# Patient Record
Sex: Male | Born: 1960 | Race: Black or African American | Hispanic: No | Smoking: Never smoker
Health system: Southern US, Community
[De-identification: ages and names within clinical notes are randomized; demographics above are authoritative.]

## PROBLEM LIST (undated history)

## (undated) DIAGNOSIS — R0600 Dyspnea, unspecified: Secondary | ICD-10-CM

## (undated) DIAGNOSIS — E119 Type 2 diabetes mellitus without complications: Secondary | ICD-10-CM

## (undated) DIAGNOSIS — E039 Hypothyroidism, unspecified: Secondary | ICD-10-CM

## (undated) DIAGNOSIS — I480 Paroxysmal atrial fibrillation: Secondary | ICD-10-CM

## (undated) DIAGNOSIS — I350 Nonrheumatic aortic (valve) stenosis: Secondary | ICD-10-CM

## (undated) DIAGNOSIS — I10 Essential (primary) hypertension: Secondary | ICD-10-CM

## (undated) DIAGNOSIS — R011 Cardiac murmur, unspecified: Secondary | ICD-10-CM

## (undated) DIAGNOSIS — I4892 Unspecified atrial flutter: Secondary | ICD-10-CM

## (undated) HISTORY — PX: CHOLECYSTECTOMY: SHX55

---

## 1999-08-06 ENCOUNTER — Ambulatory Visit (HOSPITAL_COMMUNITY): Admission: RE | Admit: 1999-08-06 | Discharge: 1999-08-06 | Payer: Self-pay

## 2001-03-05 ENCOUNTER — Ambulatory Visit (HOSPITAL_COMMUNITY): Admission: RE | Admit: 2001-03-05 | Discharge: 2001-03-05 | Payer: Self-pay | Admitting: Internal Medicine

## 2001-03-05 ENCOUNTER — Encounter: Payer: Self-pay | Admitting: Internal Medicine

## 2002-02-16 ENCOUNTER — Emergency Department (HOSPITAL_COMMUNITY): Admission: EM | Admit: 2002-02-16 | Discharge: 2002-02-16 | Payer: Self-pay | Admitting: Emergency Medicine

## 2003-04-09 ENCOUNTER — Ambulatory Visit (HOSPITAL_COMMUNITY): Admission: RE | Admit: 2003-04-09 | Discharge: 2003-04-09 | Payer: Self-pay | Admitting: Internal Medicine

## 2003-04-09 ENCOUNTER — Encounter: Payer: Self-pay | Admitting: Internal Medicine

## 2003-04-14 ENCOUNTER — Ambulatory Visit (HOSPITAL_COMMUNITY): Admission: RE | Admit: 2003-04-14 | Discharge: 2003-04-14 | Payer: Self-pay | Admitting: Internal Medicine

## 2003-04-14 ENCOUNTER — Encounter: Payer: Self-pay | Admitting: Internal Medicine

## 2004-02-11 ENCOUNTER — Emergency Department (HOSPITAL_COMMUNITY): Admission: EM | Admit: 2004-02-11 | Discharge: 2004-02-12 | Payer: Self-pay | Admitting: Emergency Medicine

## 2004-10-22 ENCOUNTER — Ambulatory Visit (HOSPITAL_COMMUNITY): Admission: RE | Admit: 2004-10-22 | Discharge: 2004-10-22 | Payer: Self-pay | Admitting: Internal Medicine

## 2006-01-30 ENCOUNTER — Emergency Department (HOSPITAL_COMMUNITY): Admission: EM | Admit: 2006-01-30 | Discharge: 2006-01-30 | Payer: Self-pay | Admitting: Emergency Medicine

## 2006-09-20 ENCOUNTER — Emergency Department (HOSPITAL_COMMUNITY): Admission: EM | Admit: 2006-09-20 | Discharge: 2006-09-20 | Payer: Self-pay | Admitting: Emergency Medicine

## 2006-09-25 ENCOUNTER — Ambulatory Visit: Payer: Self-pay | Admitting: Orthopedic Surgery

## 2006-11-06 ENCOUNTER — Ambulatory Visit: Payer: Self-pay | Admitting: Orthopedic Surgery

## 2006-11-07 ENCOUNTER — Encounter (HOSPITAL_COMMUNITY): Admission: RE | Admit: 2006-11-07 | Discharge: 2006-12-07 | Payer: Self-pay | Admitting: Orthopedic Surgery

## 2006-12-08 ENCOUNTER — Encounter (HOSPITAL_COMMUNITY): Admission: RE | Admit: 2006-12-08 | Discharge: 2007-01-07 | Payer: Self-pay | Admitting: Orthopedic Surgery

## 2007-01-09 ENCOUNTER — Encounter (HOSPITAL_COMMUNITY): Admission: RE | Admit: 2007-01-09 | Discharge: 2007-02-08 | Payer: Self-pay | Admitting: Orthopedic Surgery

## 2007-02-08 ENCOUNTER — Ambulatory Visit: Payer: Self-pay | Admitting: Orthopedic Surgery

## 2007-02-09 ENCOUNTER — Encounter (HOSPITAL_COMMUNITY): Admission: RE | Admit: 2007-02-09 | Discharge: 2007-03-11 | Payer: Self-pay | Admitting: Orthopedic Surgery

## 2007-02-20 ENCOUNTER — Emergency Department (HOSPITAL_COMMUNITY): Admission: EM | Admit: 2007-02-20 | Discharge: 2007-02-20 | Payer: Self-pay | Admitting: Emergency Medicine

## 2009-01-27 ENCOUNTER — Emergency Department (HOSPITAL_COMMUNITY): Admission: EM | Admit: 2009-01-27 | Discharge: 2009-01-27 | Payer: Self-pay | Admitting: Emergency Medicine

## 2010-12-14 LAB — URINE MICROSCOPIC-ADD ON

## 2010-12-14 LAB — URINALYSIS, ROUTINE W REFLEX MICROSCOPIC
Glucose, UA: NEGATIVE mg/dL
Specific Gravity, Urine: 1.015 (ref 1.005–1.030)
pH: 7 (ref 5.0–8.0)

## 2010-12-14 LAB — CBC
HCT: 39.9 % (ref 39.0–52.0)
MCHC: 35.7 g/dL (ref 30.0–36.0)
Platelets: 192 10*3/uL (ref 150–400)
WBC: 6.4 10*3/uL (ref 4.0–10.5)

## 2010-12-14 LAB — DIFFERENTIAL
Lymphocytes Relative: 19 % (ref 12–46)
Lymphs Abs: 1.2 10*3/uL (ref 0.7–4.0)
Neutro Abs: 4.8 10*3/uL (ref 1.7–7.7)
Neutrophils Relative %: 75 % (ref 43–77)

## 2010-12-14 LAB — BASIC METABOLIC PANEL
BUN: 8 mg/dL (ref 6–23)
CO2: 27 mEq/L (ref 19–32)
GFR calc non Af Amer: >60 mL/min (ref >60)
Potassium: 4.2 mEq/L (ref 3.5–5.1)

## 2011-01-21 NOTE — Procedures (Signed)
NAME:  Johnathan Richmond, Johnathan Richmond                        ACCOUNT NO.:  [id number]   MEDICAL RECORD NO.:  [id number]                   PATIENT TYPE:  OUT   LOCATION:  RAD                                  FACILITY:  [facility]   PHYSICIAN:  Nicki Guadalajara, M.D.                  DATE OF BIRTH:  26-Aug-1961   DATE OF PROCEDURE:  04/09/2003  DATE OF DISCHARGE:                                  ECHOCARDIOGRAM   PROCEDURE:  2-D echo Doppler study.   INDICATIONS:  This study is performed to evaluate cardiac murmur.   FINDINGS:  1. Technically this is an adequate M-mode 2-dimensional comprehensive     Doppler study.  2. There is evidence for mild concentric left ventricular hypertrophy with     septal wall thickness measuring 12 mm and posterior wall thickness     measuring 11 mm, respectively.  Left ventricular end-diastolic, and end-     systolic dimensions are normal at 47 and 32 mm, respectively.  3. Systolic function is normal with an estimated ejection fraction of at     least 55 %. There is a suggestion of mild delay in diastolic relaxation     by the mitral valve inflow signal.  4. There is mild left atrial enlargement at 45 mm.  5. Right atrium is upper normal.  Right ventricle is normal.  6. Aortic root dimension is normal at 26 mm.  7. There is doming appearance to the aortic valve.  There is evidence for a     bicuspid valve with a questionable _______ raphe.  Transvalvular peak     instantaneous gradient is 21 mm.  Mean gradient is  13 mm.  Estimated aortic valve area is 1.6 sq cm.  1. Mitral valve leaflets were delicate. There was no MR.  2. Tricuspid valve was structurally normal.  3. Pulmonic valve was structurally normal.  4. There were no intramyocardial masses, thrombi, or fusion seen.   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  Peak gradient  is 21 mm and  mean aortic  gradient is 38 mmHg.  There is no aortic regurgitation.  There is evidence  for normal systolic left ventricular function, but evidence for mild  concentric left ventricular hypertrophy and mild delay in diastolic  relaxation.  There also was a mild left atrial enlargement.                                               Nicki Guadalajara, M.D.    TK/MEDQ  D:  04/09/2003  T:  04/09/2003  Job:  [id number]   cc:   Tesfaye D. Felecia Shelling, M.D.  [address]  [place]  [place] [address]  Fax: [phone]

## 2014-09-25 ENCOUNTER — Emergency Department (HOSPITAL_COMMUNITY): Payer: Self-pay

## 2014-09-25 ENCOUNTER — Emergency Department (HOSPITAL_COMMUNITY)
Admission: EM | Admit: 2014-09-25 | Discharge: 2014-09-25 | Disposition: A | Discharge status: Home / Self Care | Payer: Self-pay | Attending: Emergency Medicine | Admitting: Emergency Medicine

## 2014-09-25 ENCOUNTER — Encounter (HOSPITAL_COMMUNITY): Payer: Self-pay | Admitting: *Deleted

## 2014-09-25 DIAGNOSIS — R52 Pain, unspecified: Secondary | ICD-10-CM

## 2014-09-25 DIAGNOSIS — M7661 Achilles tendinitis, right leg: Secondary | ICD-10-CM | POA: Insufficient documentation

## 2014-09-25 DIAGNOSIS — M25571 Pain in right ankle and joints of right foot: Secondary | ICD-10-CM | POA: Insufficient documentation

## 2014-09-25 MED ORDER — HYDROCODONE-ACETAMINOPHEN 5-325 MG PO TABS: 1.0000 | ORAL_TABLET | ORAL | Status: DC | PRN

## 2014-09-25 MED ORDER — PREDNISONE 50 MG PO TABS
60.0000 mg | ORAL_TABLET | Freq: Once | ORAL | Status: AC
  Administered 2014-09-25: 60 mg via ORAL
  Filled 2014-09-25 (×2): qty 1

## 2014-09-25 MED ORDER — DICLOFENAC SODIUM 75 MG PO TBEC: 75.0000 mg | DELAYED_RELEASE_TABLET | Freq: Two times a day (BID) | ORAL | Status: DC

## 2014-09-25 MED ORDER — KETOROLAC TROMETHAMINE 10 MG PO TABS
10.0000 mg | ORAL_TABLET | Freq: Once | ORAL | Status: AC
  Administered 2014-09-25: 10 mg via ORAL
  Filled 2014-09-25: qty 1

## 2014-09-25 MED ORDER — PREDNISONE 10 MG PO TABS: ORAL_TABLET | ORAL | Status: DC

## 2014-09-25 NOTE — Care Management Note (Signed)
ED/CM noted patient did not have health insurance and/or PCP listed in the computer.  Patient was given the [place] resource handout with information on the clinics, food pantries, and the handout for new health insurance sign-up. Pt was also given a Rx discount card. Patient expressed appreciation for information received. 

## 2014-09-25 NOTE — ED Notes (Signed)
Pt with pain to posterior ankle of right foot for 4 weeks, states that he was playing basketball and another player had landed to back of ankle

## 2014-09-25 NOTE — ED Notes (Signed)
Paged Dr.Keeling at 18:44 for Johnathan Richmond

## 2014-09-25 NOTE — Discharge Instructions (Signed)
YOur MRI suggest severe achilles tendinitis. Please use the cam walker until instructed to stop by orthopedics. Use crutches until you can safely apply weight to the right ankle. Please use diclofenac and prednisone taper daily with food. Use norco every 4 hours for pain if needed. This medication may cause drowsiness, use with caution. Please call Dr Hilda LiasKeeling next week for appointment and follow up. Achilles Tendinitis Achilles tendinitis is inflammation of the tough, cord-like band that attaches the lower muscles of your leg to your heel (Achilles tendon). It is usually caused by overusing the tendon and joint involved.  CAUSES Achilles tendinitis can happen because of:  A sudden increase in exercise or activity (such as running).  Doing the same exercises or activities (such as jumping) over and over.  Not warming up calf muscles before exercising.  Exercising in shoes that are worn out or not made for exercise.  Having arthritis or a bone growth on the back of the heel bone. This can rub against the tendon and hurt the tendon. SIGNS AND SYMPTOMS The most common symptoms are:  Pain in the back of the leg, just above the heel. The pain usually gets worse with exercise and better with rest.  Stiffness or soreness in the back of the leg, especially in the morning.  Swelling of the skin over the Achilles tendon.  Trouble standing on tiptoe. Sometimes, an Achilles tendon tears (ruptures). Symptoms of an Achilles tendon rupture can include:  Sudden, severe pain in the back of the leg.  Trouble putting weight on the foot or walking normally. DIAGNOSIS Achilles tendinitis will be diagnosed based on symptoms and a physical examination. An X-ray may be done to check if another condition is causing your symptoms. An MRI may be ordered if your health care provider suspects you may have completely torn your tendon, which is called an Achilles tendon rupture.  TREATMENT  Achilles tendinitis  usually gets better over time. It can take weeks to months to heal completely. Treatment focuses on treating the symptoms and helping the injury heal. HOME CARE INSTRUCTIONS   Rest your Achilles tendon and avoid activities that cause pain.  Apply ice to the injured area:  Put ice in a plastic bag.  Place a towel between your skin and the bag.  Leave the ice on for 20 minutes, 2-3 times a day  Try to avoid using the tendon (other than gentle range of motion) while the tendon is painful. Do not resume use until instructed by your health care provider. Then begin use gradually. Do not increase use to the point of pain. If pain does develop, decrease use and continue the above measures. Gradually increase activities that do not cause discomfort until you achieve normal use.  Do exercises to make your calf muscles stronger and more flexible. Your health care provider or physical therapist can recommend exercises for you to do.  Wrap your ankle with an elastic bandage or other wrap. This can help keep your tendon from moving too much. Your health care provider will show you how to wrap your ankle correctly.  Only take over-the-counter or prescription medicines for pain, discomfort, or fever as directed by your health care provider. SEEK MEDICAL CARE IF:   Your pain and swelling increase or pain is uncontrolled with medicines.  You develop new, unexplained symptoms or your symptoms get worse.  You are unable to move your toes or foot.  You develop warmth and swelling in your foot.  You have  an unexplained temperature. MAKE SURE YOU:   Understand these instructions.  Will watch your condition.  Will get help right away if you are not doing well or get worse. Document Released: 06/01/2005 Document Revised: 06/12/2013 Document Reviewed: 04/03/2013 [facility] Patient Information 2015 [place], [place]. This information is not intended to replace advice given to you by your health care  provider. Make sure you discuss any questions you have with your health care provider.

## 2014-09-25 NOTE — ED Provider Notes (Signed)
CSN: [id number]     Arrival date & time 09/25/14  1430 History   First MD Initiated Contact with Patient 09/25/14 1505     Chief Complaint  Patient presents with  . Foot Pain     (Consider location/radiation/quality/duration/timing/severity/associated sxs/prior Treatment) Patient is a 54 y.o. male presenting with lower extremity pain. The history is provided by the patient.  Foot Pain This is a new problem. The current episode started more than 1 month ago. The problem occurs intermittently. The problem has been gradually worsening. Associated symptoms include arthralgias and joint swelling. Pertinent negatives include no abdominal pain, chest pain, coughing, neck pain or numbness. The symptoms are aggravated by standing and walking. He has tried nothing for the symptoms. The treatment provided no relief.    History reviewed. No pertinent past medical history. Past Surgical History  Procedure Laterality Date  . Cholecystectomy     History reviewed. No pertinent family history. History  Substance Use Topics  . Smoking status: Never Smoker   . Smokeless tobacco: Not on file  . Alcohol Use: No    Review of Systems  Constitutional: Negative for activity change.       All ROS Neg except as noted in HPI  HENT: Negative for nosebleeds.   Eyes: Negative for photophobia and discharge.  Respiratory: Negative for cough, shortness of breath and wheezing.   Cardiovascular: Negative for chest pain and palpitations.  Gastrointestinal: Negative for abdominal pain and blood in stool.  Genitourinary: Negative for dysuria, frequency and hematuria.  Musculoskeletal: Positive for joint swelling and arthralgias. Negative for back pain and neck pain.  Skin: Negative.   Neurological: Negative for dizziness, seizures, speech difficulty and numbness.  Psychiatric/Behavioral: Negative for hallucinations and confusion.      Allergies  Review of patient's allergies indicates no known  allergies.  Home Medications   Prior to Admission medications   Not on File   BP 136/93 mmHg  Pulse 86  Temp(Src) 98.5 F (36.9 C) (Oral)  Resp 20  Ht  (1.88 m)  Wt 289 lb (131.09 kg)  BMI 37.09 kg/m2  SpO2 95% Physical Exam  Musculoskeletal:       Right ankle: He exhibits decreased range of motion and swelling. He exhibits no ecchymosis, no deformity and normal pulse. Tenderness. Achilles tendon exhibits pain. Achilles tendon exhibits normal Thompson's test results.    ED Course  Procedures (including critical care time) Labs Review Labs Reviewed - No data to display  Imaging Review Dg Ankle Complete Right  09/25/2014   CLINICAL DATA:  Posterior RIGHT ankle pain and swelling for 4 weeks, another player stepped on his heel while playing basketball  EXAM: RIGHT ANKLE - COMPLETE 3+ VIEW  COMPARISON:  None  FINDINGS: Diffuse soft tissue swelling.  Osseous mineralization normal.  Ankle mortise intact.  Small plantar calcaneal spur.  No acute fracture or dislocation.  Distal Achilles tendon appears thickened with poor definition of the Achilles tendon more proximally.  IMPRESSION: No acute osseous abnormalities.  Small plantar calcaneal spur.  Thickening of the distal Achilles tendon with poor definition slightly more proximally, cannot exclude Achilles injury.  If there is clinically suspected injury to the Achilles tendon or the musculotendinous junction region, consider MR.   Electronically Signed   By: Ulyses Southward M.D.   On: 09/25/2014 15:23   Mr Ankle Right  Wo Contrast  09/25/2014   CLINICAL DATA:  Right ankle injury playing basketball 4 weeks ago. Continued pain and swelling.  EXAM:  MRI OF THE RIGHT ANKLE WITHOUT CONTRAST  TECHNIQUE: Multiplanar, multisequence MR imaging of the ankle was performed. No intravenous contrast was administered.  COMPARISON:  Plain films right ankle 08/19/2014 and earlier today.  FINDINGS: Subcutaneous edema seen about the ankle.  TENDONS  Peroneal:  There is longitudinal split tearing of the peroneus brevis as it passes on the calcaneus. No complete tear is identified.  Posteromedial: Intact.  Anterior:  Intact.  Achilles: The tendon is markedly thickened with intrasubstance increased T2 signal but no tear is identified.  Plantar Fascia: Intact.  LIGAMENTS  Lateral: Intact.  Medial: Intact.  CARTILAGE  Ankle Joint: Tiny osteochondral lesion is seen in the lateral talar dome posteriorly measuring 0.3 cm in diameter.  Subtalar Joints/Sinus Tarsi: Unremarkable.  Bones: No fracture, stress change or worrisome marrow lesion.  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  Longitudinal split tear of the peroneus brevis without surrounding edema is likely chronic.  Tiny osteochondral lesion lateral talar dome.   Electronically Signed   By: Drusilla Kannerhomas  Dalessio M.D.   On: 09/25/2014 18:37     EKG Interpretation None      MDM The MRI suggest a severe achilles tendinosis. Discussed case with Dr Hilda LiasKeeling. Pt placed in a cam walker and crutches. Rx for diclofenac, prednisone, and norco given to the patient. Pt to see Dr Hilda LiasKeeling next week..   Final diagnoses:  Pain  Ankle pain, right  Tendonitis, Achilles, right    *I have reviewed nursing notes, vital signs, and all appropriate lab and imaging results for this patient.[address]    Valera Vallas Garry HeaterM Delawrence Fridman, PA-C 09/25/14 1907  Raeford RazorStephen Kohut, MD 09/26/14 1556

## 2014-09-30 MED FILL — Hydrocodone-Acetaminophen Tab 5-325 MG: ORAL | Qty: 6 | Status: AC

## 2014-11-24 ENCOUNTER — Emergency Department (HOSPITAL_COMMUNITY)
Admission: EM | Admit: 2014-11-24 | Discharge: 2014-11-25 | Disposition: A | Discharge status: Home / Self Care | Payer: Self-pay | Attending: Emergency Medicine | Admitting: Emergency Medicine

## 2014-11-24 ENCOUNTER — Emergency Department (HOSPITAL_COMMUNITY): Payer: Self-pay

## 2014-11-24 ENCOUNTER — Encounter (HOSPITAL_COMMUNITY): Payer: Self-pay | Admitting: *Deleted

## 2014-11-24 DIAGNOSIS — Y9389 Activity, other specified: Secondary | ICD-10-CM | POA: Insufficient documentation

## 2014-11-24 DIAGNOSIS — Z791 Long term (current) use of non-steroidal anti-inflammatories (NSAID): Secondary | ICD-10-CM | POA: Insufficient documentation

## 2014-11-24 DIAGNOSIS — Y9289 Other specified places as the place of occurrence of the external cause: Secondary | ICD-10-CM | POA: Insufficient documentation

## 2014-11-24 DIAGNOSIS — R52 Pain, unspecified: Secondary | ICD-10-CM

## 2014-11-24 DIAGNOSIS — R109 Unspecified abdominal pain: Secondary | ICD-10-CM | POA: Insufficient documentation

## 2014-11-24 DIAGNOSIS — X58XXXA Exposure to other specified factors, initial encounter: Secondary | ICD-10-CM | POA: Insufficient documentation

## 2014-11-24 DIAGNOSIS — Y998 Other external cause status: Secondary | ICD-10-CM | POA: Insufficient documentation

## 2014-11-24 DIAGNOSIS — Z79899 Other long term (current) drug therapy: Secondary | ICD-10-CM | POA: Insufficient documentation

## 2014-11-24 DIAGNOSIS — S39012A Strain of muscle, fascia and tendon of lower back, initial encounter: Secondary | ICD-10-CM | POA: Insufficient documentation

## 2014-11-24 NOTE — ED Notes (Addendum)
Pt reporting pain in right flank.  Pt denies any pain with urination, denies history of kidney stones.  Denies nausea or vomiting. Reports that he was playing golf yesterday.

## 2014-11-25 ENCOUNTER — Emergency Department (HOSPITAL_COMMUNITY): Payer: Self-pay

## 2014-11-25 LAB — URINALYSIS, ROUTINE W REFLEX MICROSCOPIC
Bilirubin Urine: NEGATIVE
Glucose, UA: NEGATIVE mg/dL
Hgb urine dipstick: NEGATIVE
Ketones, ur: NEGATIVE mg/dL
LEUKOCYTES UA: NEGATIVE
Nitrite: NEGATIVE
PH: 5.5 (ref 5.0–8.0)
Protein, ur: NEGATIVE mg/dL
Urobilinogen, UA: 0.2 mg/dL (ref 0.0–1.0)

## 2014-11-25 MED ORDER — NAPROXEN 500 MG PO TABS: 500.0000 mg | ORAL_TABLET | Freq: Two times a day (BID) | ORAL | Status: DC

## 2014-11-25 MED ORDER — CYCLOBENZAPRINE HCL 5 MG PO TABS: 5.0000 mg | ORAL_TABLET | Freq: Three times a day (TID) | ORAL | Status: DC | PRN

## 2014-11-25 MED ORDER — FENTANYL CITRATE 0.05 MG/ML IJ SOLN
50.0000 ug | Freq: Once | INTRAMUSCULAR | Status: AC
  Administered 2014-11-25: 50 ug via INTRAVENOUS
  Filled 2014-11-25: qty 2

## 2014-11-25 MED ORDER — KETOROLAC TROMETHAMINE 30 MG/ML IJ SOLN
30.0000 mg | Freq: Once | INTRAMUSCULAR | Status: AC
  Administered 2014-11-25: 30 mg via INTRAVENOUS
  Filled 2014-11-25: qty 1

## 2014-11-25 NOTE — ED Provider Notes (Signed)
CSN: [id number]     Arrival date & time 11/24/14  2204 History   First MD Initiated Contact with Patient 11/25/14 0053     Chief Complaint  Patient presents with  . Flank Pain     (Consider location/radiation/quality/duration/timing/severity/associated sxs/prior Treatment) HPI  Patient states he started getting left-sided flank pain a few days ago. He states it comes and goes and last a few minutes. He states however today the pain has been constant. He states he feels like he is "bleeding inside" and states he feels a dripping in his left flank area inside. He states standing up makes the pain feel worse, sitting down makes it feel better. He denies nausea, vomiting, hematuria, or fever. He states he's never had this before. He does report he is a heavy caffeine drinker and drinks about 80 ounces of soda a day. He does report he played golf over the weekend but states he does that all the time. He denies any other known injury.  Family history is negative for renal stones.  Patient states he used to take thyroid pills when Dr. Katrinka Blazing was in town. Dr. Katrinka Blazing has been gone at least 10 years.  PCP None  History reviewed. No pertinent past medical history. Past Surgical History  Procedure Laterality Date  . Cholecystectomy     No family history on file. History  Substance Use Topics  . Smoking status: Never Smoker   . Smokeless tobacco: Not on file  . 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     Allergies  Review of patient's allergies indicates no known allergies.  Home Medications   Prior to Admission medications   Medication Sig Start Date End Date Taking? Authorizing Provider  cyclobenzaprine (FLEXERIL) 5 MG tablet Take 1 tablet (5 mg total) by mouth 3 (three) times daily as needed. 11/25/14   Devoria Albe, MD  diclofenac (VOLTAREN) 75 MG EC tablet Take 1 tablet (75 mg total) by mouth 2 (two) times  daily. 09/25/14   Ivery Quale, PA-C  diclofenac (VOLTAREN) 75 MG EC tablet Take 1 tablet (75 mg total) by mouth 2 (two) times daily. 09/25/14   Ivery Quale, PA-C  HYDROcodone-acetaminophen (NORCO/VICODIN) 5-325 MG per tablet Take 1-2 tablets by mouth every 4 (four) hours as needed for moderate pain or severe pain. 09/25/14   Ivery Quale, PA-C  HYDROcodone-acetaminophen (NORCO/VICODIN) 5-325 MG per tablet Take 1 tablet by mouth every 4 (four) hours as needed. 09/25/14   Ivery Quale, PA-C  naproxen (NAPROSYN) 500 MG tablet Take 1 tablet (500 mg total) by mouth 2 (two) times daily. 11/25/14   Devoria Albe, MD  predniSONE (DELTASONE) 10 MG tablet 5,4,3,2,1 - take with food 09/25/14   Ivery Quale, PA-C   BP 136/87 mmHg  Pulse 74  Temp(Src) 97.7 F (36.5 C) (Oral)  Resp 16  Ht  (1.88 m)  Wt 290 lb (131.543 kg)  BMI 37.22 kg/m2  SpO2 99%  Vital signs normal   Physical Exam  Constitutional: He is oriented to person, place, and time. He appears well-developed and well-nourished.  Non-toxic appearance. He does not appear ill. No distress.  Patient is sitting in a wheelchair at the bedside. He states it is too uncomfortable to lay down on the stretcher.  HENT:  Head: Normocephalic and atraumatic.  Right Ear: External ear normal.  Left Ear: External ear normal.  Nose: Nose normal. No mucosal edema  or rhinorrhea.  Mouth/Throat: Oropharynx is clear and moist and mucous membranes are normal. No dental abscesses or uvula swelling.  Eyes: Conjunctivae and EOM are normal. Pupils are equal, round, and reactive to light.  Neck: Normal range of motion and full passive range of motion without pain. Neck supple.  Cardiovascular: Normal rate, regular rhythm and normal heart sounds.  Exam reveals no gallop and no friction rub.   No murmur heard. Pulmonary/Chest: Effort normal and breath sounds normal. No respiratory distress. He has no wheezes. He has no rhonchi. He has no rales. He exhibits no  tenderness and no crepitus.  Abdominal: Soft. Normal appearance and bowel sounds are normal. He exhibits no distension. There is no tenderness. There is no rebound and no guarding.  Musculoskeletal: Normal range of motion. He exhibits no edema or tenderness.  Moves all extremities well.  Nontender thoracic and lumbar spine. He has some tenderness over the left paraspinous/flank area. He appears painful when he changes positions.  Neurological: He is alert and oriented to person, place, and time. He has normal strength. No cranial nerve deficit.  Skin: Skin is warm, dry and intact. No rash noted. No erythema. No pallor.  Psychiatric: He has a normal mood and affect. His speech is normal and behavior is normal. His mood appears not anxious.  Nursing note and vitals reviewed.   ED Course  Procedures (including critical care time)  Medications  fentaNYL (SUBLIMAZE) injection 50 mcg (50 mcg Intravenous Given 11/25/14 0126)  ketorolac (TORADOL) 30 MG/ML injection 30 mg (30 mg Intravenous Given 11/25/14 0324)    Patient was given thinned L for his pain while waiting for his CT results. He was given his CT results and had Toradol added to his pain medication.   Labs Review Results for orders placed or performed during the hospital encounter of 11/24/14  Urinalysis, Routine w reflex microscopic  Result Value Ref Range   Color, Urine YELLOW YELLOW   APPearance CLEAR CLEAR   Specific Gravity, Urine >1.030 (H) 1.005 - 1.030   pH 5.5 5.0 - 8.0   Glucose, UA NEGATIVE NEGATIVE mg/dL   Hgb urine dipstick NEGATIVE NEGATIVE   Bilirubin Urine NEGATIVE NEGATIVE   Ketones, ur NEGATIVE NEGATIVE mg/dL   Protein, ur NEGATIVE NEGATIVE mg/dL   Urobilinogen, UA 0.2 0.0 - 1.0 mg/dL   Nitrite NEGATIVE NEGATIVE   Leukocytes, UA NEGATIVE NEGATIVE   Laboratory interpretation all normal except concentrated urine     Imaging Review Dg Lumbar Spine Complete  11/25/2014   CLINICAL DATA:  Acute onset of right  lower back pain after playing golf. Initial encounter.  EXAM: LUMBAR SPINE - COMPLETE 4+ VIEW  COMPARISON:  CT of the abdomen and pelvis from 01/27/2009  FINDINGS: There is no evidence of fracture or subluxation. Vertebral bodies demonstrate normal height and alignment. Intervertebral disc spaces are preserved. The visualized neural foramina are grossly unremarkable in appearance.  The visualized bowel gas pattern is unremarkable in appearance; air and stool are noted within the colon. The sacroiliac joints are within normal limits. Clips are noted within the right upper quadrant, reflecting prior cholecystectomy.  IMPRESSION: No evidence of fracture or subluxation along the lumbar spine.   Electronically Signed   By: Roanna Raider M.D.   On: 11/25/2014 00:25   Ct Renal Stone Study  11/25/2014   CLINICAL DATA:  Left-sided flank pain after golf yesterday  EXAM: CT ABDOMEN AND PELVIS WITHOUT CONTRAST  TECHNIQUE: Multidetector CT imaging of the abdomen and pelvis  was performed following the standard protocol without IV contrast.  COMPARISON:  Radiographs 11/24/2014  FINDINGS: There are unremarkable unenhanced appearances of the liver, spleen, pancreas, adrenals and kidneys. There is cholecystectomy. Mesentery and bowel appear unremarkable. The abdominal aorta is normal in caliber with no atherosclerotic calcifications.  There is no acute inflammatory change in the abdomen or pelvis. There is no ascites.  No acute musculoskeletal abnormalities are evident.  There is a ventral hernia at the umbilicus containing unobstructed small bowel.  No significant abnormality is evident in the lower chest.  IMPRESSION: 1. No acute findings in the abdomen or pelvis 2. Umbilical hernia containing unobstructed small bowel   Electronically Signed   By: Ellery Plunkaniel R Mitchell M.D.   On: 11/25/2014 02:13     EKG Interpretation None      MDM   patient presents with left flank pain that appears to be musculoskeletal in origin. I did  not start him on his thyroid medication. He has been off of it many years. He needs to get a primary care doctor to start him on his medication again.    Final diagnoses:  Acute left flank pain  Strain of lumbar paraspinal muscle, initial encounter   Discharge Medication List as of 11/25/2014  3:20 AM    START taking these medications   Details  cyclobenzaprine (FLEXERIL) 5 MG tablet Take 1 tablet (5 mg total) by mouth 3 (three) times daily as needed., Starting 11/25/2014, Until Discontinued, Print    naproxen (NAPROSYN) 500 MG tablet Take 1 tablet (500 mg total) by mouth 2 (two) times daily., Starting 11/25/2014, Until Discontinued, Print        Plan discharge  Devoria AlbeIva Melessia Kaus, MD, Concha PyoFACEP    Reannon Candella, MD 11/25/14 [phone]

## 2014-11-25 NOTE — Discharge Instructions (Signed)
Use ice and heat over the sore muscle. Avoid heavy lifting, straining or activity that makes the pain worse. Take the medications as prescribed. You need to get a primary care doctor to evaluate your thyroid problem or you could see the endocrinologist, Dr Fransico HimNida.   Recheck if you get worse such as fever, vomiting, worsening pain.

## 2014-11-25 NOTE — ED Notes (Signed)
Pt alert & oriented x4, stable gait. Patient given discharge instructions, paperwork & prescription(s). Patient stopped at the registration desk to finish any additional paperwork. Patient  verbalized understanding. Pt left department in wheelchair w/ no further questions. 

## 2014-11-25 NOTE — ED Notes (Signed)
Patient states that it hurts when he is lying down. Sitting in wheelchair at this time.

## 2017-05-23 ENCOUNTER — Encounter (HOSPITAL_COMMUNITY): Payer: Self-pay | Admitting: *Deleted

## 2017-05-23 ENCOUNTER — Emergency Department (HOSPITAL_COMMUNITY)
Admission: EM | Admit: 2017-05-23 | Discharge: 2017-05-24 | Disposition: A | Discharge status: Home / Self Care | Payer: Self-pay | Attending: Emergency Medicine | Admitting: Emergency Medicine

## 2017-05-23 DIAGNOSIS — I480 Paroxysmal atrial fibrillation: Secondary | ICD-10-CM | POA: Insufficient documentation

## 2017-05-23 DIAGNOSIS — R42 Dizziness and giddiness: Secondary | ICD-10-CM | POA: Insufficient documentation

## 2017-05-23 NOTE — ED Triage Notes (Signed)
Pt states he feels like his hear is palpating. Started while watching the game.

## 2017-05-24 ENCOUNTER — Other Ambulatory Visit (HOSPITAL_COMMUNITY): Payer: Self-pay | Admitting: *Deleted

## 2017-05-24 ENCOUNTER — Telehealth (HOSPITAL_COMMUNITY): Payer: Self-pay | Admitting: *Deleted

## 2017-05-24 LAB — I-STAT CHEM 8, ED
BUN: 14 mg/dL (ref 6–20)
CHLORIDE: 100 mmol/L — AB (ref 101–111)
Calcium, Ion: 1.09 mmol/L — ABNORMAL LOW (ref 1.15–1.40)
Creatinine, Ser: 1 mg/dL (ref 0.61–1.24)
GLUCOSE: 134 mg/dL — AB (ref 65–99)
HCT: 44 % (ref 39.0–52.0)
Hemoglobin: 15 g/dL (ref 13.0–17.0)
Potassium: 3.8 mmol/L (ref 3.5–5.1)
Sodium: 140 mmol/L (ref 135–145)
TCO2: 28 mmol/L (ref 22–32)

## 2017-05-24 LAB — I-STAT TROPONIN, ED: Troponin i, poc: 0 ng/mL (ref 0.00–0.08)

## 2017-05-24 LAB — PROTIME-INR
INR: 0.95
Prothrombin Time: 12.6 seconds (ref 11.4–15.2)

## 2017-05-24 MED ORDER — RIVAROXABAN 10 MG PO TABS
ORAL_TABLET | ORAL | Status: AC
  Filled 2017-05-24: qty 2

## 2017-05-24 MED ORDER — RIVAROXABAN 20 MG PO TABS: 20.0000 mg | ORAL_TABLET | Freq: Every day | ORAL | Status: DC

## 2017-05-24 MED ORDER — PROPOFOL 10 MG/ML IV BOLUS
80.0000 mg | Freq: Once | INTRAVENOUS | Status: DC
  Filled 2017-05-24: qty 20

## 2017-05-24 MED ORDER — RIVAROXABAN 20 MG PO TABS
20.0000 mg | ORAL_TABLET | Freq: Once | ORAL | Status: AC
  Administered 2017-05-24: 20 mg via ORAL
  Filled 2017-05-24: qty 1

## 2017-05-24 MED ORDER — SODIUM CHLORIDE 0.9 % IV BOLUS (SEPSIS)
1000.0000 mL | Freq: Once | INTRAVENOUS | Status: AC
  Administered 2017-05-24: 1000 mL via INTRAVENOUS

## 2017-05-24 MED ORDER — RIVAROXABAN 20 MG PO TABS: 20.0000 mg | ORAL_TABLET | Freq: Every day | ORAL | 0 refills | Status: DC

## 2017-05-24 MED ORDER — RIVAROXABAN (XARELTO) EDUCATION KIT FOR AFIB PATIENTS
PACK | Freq: Once | Status: AC
  Administered 2017-05-24: 04:00:00
  Filled 2017-05-24: qty 1

## 2017-05-24 NOTE — ED Notes (Signed)
Pt ambulatory to waiting room. Pt verbalized understanding of discharge instructions.   

## 2017-05-24 NOTE — Progress Notes (Signed)
ANTICOAGULATION CONSULT NOTE - Preliminary  Pharmacy Consult for Rivaroxaban Indication:Atrial fibbrillation  No Known Allergies  Patient Measurements: Height:  (188 cm) Weight: 300 lb (136.1 kg) IBW/kg (Calculated) : 82.2 HEPARIN DW (KG): 112.7   Vital Signs: Temp: 97.9 F (36.6 C) (09/18 2317) Temp Source: Oral (09/18 2317) BP: 129/87 (09/19 0200) Pulse Rate: 84 (09/19 0200)  Labs:  Recent Labs  05/24/17 0034  HGB 15.0  HCT 44.0  CREATININE 1.00   Estimated Creatinine Clearance: 119.7 mL/min (by C-G formula based on SCr of 1 mg/dL).  Medical History: History reviewed. No pertinent past medical history.  Medications:   Assessment: 56 yo obese male presented to the ED with 12-24 hr hx of palpitations and dizziness. Pharmacy has been asked to provide rivaroxaban dosing for dx of new onset Afib.  Goal of Therapy:  VTE prophylaxis   Plan:  Rivaroxaban 20 mg x one dose now.   Preliminary review of pertinent patient information completed.  Jeani Hawking clinical pharmacist will complete review during morning rounds to assess the patient and finalize treatment regimen.  Arelia Sneddon, [facility] 05/24/2017,2:23 AM

## 2017-05-24 NOTE — ED Provider Notes (Signed)
[facility] DEPT Provider Note   CSN: [id number] Arrival date & time: 05/23/17  2308     History   Chief Complaint Chief Complaint  Patient presents with  . Palpitations    HPI Johnathan Richmond is a 56 y.o. male.  The history is provided by the patient.  Palpitations   This is a new problem. The current episode started 12 to 24 hours ago. The problem occurs constantly. The problem has been gradually worsening. Associated symptoms include dizziness. Pertinent negatives include no fever, no chest pressure, no syncope, no abdominal pain, no vomiting and no shortness of breath. He has tried nothing for the symptoms. Risk factors include obesity.   Pt without any medical conditions presents with palpitations, over the past 12 hrs No syncope No new meds or OTC meds/vitamins He feels lightheaded No CP He has never had this before   PMH - none Soc hx - denies drug use  Past Surgical History:  Procedure Laterality Date  . CHOLECYSTECTOMY         Home Medications    Prior to Admission medications   Not on File    Family History No family history on file.  Social History Social History  Substance Use Topics  . Smoking status: Never Smoker  . Smokeless tobacco: Never Used  . Alcohol use No     Allergies   Patient has no known allergies.   Review of Systems Review of Systems  Constitutional: Negative for fever.  Respiratory: Negative for shortness of breath.   Cardiovascular: Positive for palpitations. Negative for syncope.  Gastrointestinal: Negative for abdominal pain and vomiting.  Neurological: Positive for dizziness. Negative for syncope.  All other systems reviewed and are negative.    Physical Exam Updated Vital Signs BP (!) 137/93 (BP Location: Left Arm)   Pulse (!) 51   Temp 97.9 F (36.6 C) (Oral)   Resp 18   Ht 1.88 m ('6\' 2"' )   Wt 136.1 kg (300 lb)   SpO2 95%   BMI 38.52 kg/m   Physical Exam CONSTITUTIONAL: Well developed/well  nourished HEAD: Normocephalic/atraumatic EYES: EOMI/PERRL ENMT: Mucous membranes moist NECK: supple no meningeal signs SPINE/BACK:entire spine nontender CV: tachycardic and irregular LUNGS: Lungs are clear to auscultation bilaterally, no apparent distress ABDOMEN: soft, nontender, no rebound or guarding, bowel sounds noted throughout abdomen GU:no cva tenderness NEURO: Pt is awake/alert/appropriate, moves all extremitiesx4.  No facial droop.   EXTREMITIES: pulses normal/equal, full ROM SKIN: warm, color normal PSYCH: no abnormalities of mood noted, alert and oriented to situation   ED Treatments / Results  Labs (all labs ordered are listed, but only abnormal results are displayed) Labs Reviewed  I-STAT CHEM 8, ED - Abnormal; Notable for the following:       Result Value   Chloride 100 (*)    Glucose, Bld 134 (*)    Calcium, Ion 1.09 (*)    All other components within normal limits  PROTIME-INR  I-STAT TROPONIN, ED    EKG  EKG Interpretation  Date/Time:  Tuesday May 23 2017 23:27:32 EDT Ventricular Rate:  92 PR Interval:    QRS Duration: 100 QT Interval:  362 QTC Calculation: 448 R Axis:   14 Text Interpretation:  Sinus rhythm Atrial premature complex RSR' in V1 or V2, right VCD or RVH No previous ECGs available Confirmed by Ripley Fraise ([id number]) on 05/23/2017 11:30:04 PM       Repeat EKG:   EKG Interpretation  Date/Time:  Wednesday May 24 2017  00:10:40 EDT Ventricular Rate:  163 PR Interval:    QRS Duration: 95 QT Interval:  316 QTC Calculation: 521 R Axis:   25 Text Interpretation:  Atrial flutter RSR' in V1 or V2, right VCD or RVH Prolonged QT interval changed from prior Confirmed by Ripley Fraise [id number]) on 05/24/2017 12:29:55 AM       Radiology No results found.  Procedures Procedures   Medications Ordered in ED Medications  sodium chloride 0.9 % bolus 1,000 mL (0 mLs Intravenous Stopped 05/24/17 0335)  rivaroxaban (XARELTO)  Education Kit for Afib patients ( Does not apply Given 05/24/17 0335)  rivaroxaban (XARELTO) tablet 20 mg (20 mg Oral Given 05/24/17 0233)     Initial Impression / Assessment and Plan / ED Course  I have reviewed the triage vital signs and the nursing notes.  Pertinent labs & imaging results that were available during my care of the patient were reviewed by me and considered in my medical decision making (see chart for details).    This patients CHA2DS2-VASc Score and unadjusted Ischemic Stroke Rate (% per year) is equal to 0.2 % stroke rate/year from a score of 0  Above score calculated as 1 point each if present [CHF, HTN, DM, Vascular=MI/PAD/Aortic Plaque, Age if 65-74, or Male] Above score calculated as 2 points each if present [Age > 75, or Stroke/TIA/TE]    Pt stable Found to have new onset afib He agrees to proceed with cardioversion No contraindications noted, symptoms less than 48 hrs  3:52 AM Pt spontaneously converted to sinus rhythm  EKG Interpretation  Date/Time:  Wednesday May 24 2017 01:54:59 EDT Ventricular Rate:  88 PR Interval:    QRS Duration: 91 QT Interval:  371 QTC Calculation: 449 R Axis:   -2 Text Interpretation:  Sinus rhythm RSR' in V1 or V2, right VCD or RVH changed from prior Confirmed by Ripley Fraise [phone]) on 05/24/2017 1:57:53 AM      Stable in ED He feels improved Will d/c home Will start xarelto until seen by cardiology Starter pack given Discussed side effects/risk of xarelto use including GI bleed Also discussed signs of repeat of afib and when to come back to the ER  BP 127/81   Pulse 71   Temp 97.9 F (36.6 C) (Oral)   Resp 13   Ht 1.88 m ('6\' 2"' )   Wt 136.1 kg (300 lb)   SpO2 95%   BMI 38.52 kg/m   Final Clinical Impressions(s) / ED Diagnoses   Final diagnoses:  Paroxysmal atrial fibrillation ([place])    New Prescriptions Discharge Medication List as of 05/24/2017  3:26 AM    START taking these medications    Details  rivaroxaban (XARELTO) 20 MG TABS tablet Take 1 tablet (20 mg total) by mouth daily with supper., Starting Wed 05/24/2017, Print         Ripley Fraise, MD 05/24/17 [phone]

## 2017-05-24 NOTE — Telephone Encounter (Signed)
LMOM.  Pt referred from [facility] ED for afib

## 2017-05-26 ENCOUNTER — Ambulatory Visit (HOSPITAL_COMMUNITY)
Admission: RE | Admit: 2017-05-26 | Discharge: 2017-05-26 | Disposition: A | Discharge status: Home / Self Care | Payer: Self-pay | Source: Ambulatory Visit | Attending: Nurse Practitioner | Admitting: Nurse Practitioner

## 2017-05-26 ENCOUNTER — Encounter (HOSPITAL_COMMUNITY): Payer: Self-pay | Admitting: Nurse Practitioner

## 2017-05-26 VITALS — BP 152/92 | HR 79 | Ht 74.0 in | Wt 305.8 lb

## 2017-05-26 DIAGNOSIS — Z7901 Long term (current) use of anticoagulants: Secondary | ICD-10-CM | POA: Insufficient documentation

## 2017-05-26 DIAGNOSIS — I10 Essential (primary) hypertension: Secondary | ICD-10-CM | POA: Insufficient documentation

## 2017-05-26 DIAGNOSIS — I480 Paroxysmal atrial fibrillation: Secondary | ICD-10-CM

## 2017-05-26 DIAGNOSIS — I4892 Unspecified atrial flutter: Secondary | ICD-10-CM | POA: Insufficient documentation

## 2017-05-26 DIAGNOSIS — I4510 Unspecified right bundle-branch block: Secondary | ICD-10-CM | POA: Insufficient documentation

## 2017-05-26 DIAGNOSIS — E669 Obesity, unspecified: Secondary | ICD-10-CM | POA: Insufficient documentation

## 2017-05-26 LAB — T4, FREE: Free T4: 0.82 ng/dL (ref 0.61–1.12)

## 2017-05-26 LAB — TSH: TSH: 8.555 u[IU]/mL — ABNORMAL HIGH (ref 0.350–4.500)

## 2017-05-26 MED ORDER — DILTIAZEM HCL 30 MG PO TABS: ORAL_TABLET | ORAL | 1 refills | Status: DC

## 2017-05-26 MED ORDER — DILTIAZEM HCL ER COATED BEADS 120 MG PO CP24: 120.0000 mg | ORAL_CAPSULE | Freq: Every day | ORAL | 3 refills | Status: DC

## 2017-05-26 NOTE — Patient Instructions (Addendum)
Your physician has recommended you make the following change in your medication:  1) Cardizem  once a day  2) Cardizem  -- take 1 tablet every 4 hours AS NEEDED for heart rate >100 as long as blood pressure >100.   Avoid ibuprofen, advil, motrin, aspirin while on xarelto. May take tylenol for pain.

## 2017-05-27 LAB — T3, FREE: T3, Free: 2.7 pg/mL (ref 2.0–4.4)

## 2017-05-29 NOTE — Progress Notes (Signed)
Primary Care Physician: Patient, No Pcp Per Referring Physician:[facility] ER f/u   Johnathan Richmond is a 56 y.o. male with a h/o obesity that presented to [facility] ER with palpitations that started 12-24 hours ago  and found to have ne onset  atrial flutter at 163 bpm. He converted in the ER. He was discharged home on xarelto 20 mg daily and referred to afib clinic.  In the afib clinic, he feels well, no further episodes. He is currently between jobs and without insurance. He does not drink alcohol, high salt diet, no previous echo, no TSH has been checked. He has been told he snores. No tobacco or street drugs. High caffeine content. Under stress for being out of work, will start new job in the next 1-2 weeks but will h=not have coverage for 4-6 weeks.  Today, he denies symptoms of palpitations, chest pain, shortness of breath, orthopnea, PND, lower extremity edema, dizziness, presyncope, syncope, or neurologic sequela. The patient is tolerating medications without difficulties and is otherwise without complaint today.   No past medical history on file. Past Surgical History:  Procedure Laterality Date  . CHOLECYSTECTOMY      Current Outpatient Prescriptions  Medication Sig Dispense Refill  . rivaroxaban (XARELTO) 20 MG TABS tablet Take 1 tablet (20 mg total) by mouth daily with supper. 30 tablet 0  . diltiazem (CARDIZEM CD) 120 MG 24 hr capsule Take 1 capsule (120 mg total) by mouth daily. 30 capsule 3  . diltiazem (CARDIZEM) 30 MG tablet Take 1 tablet every 4 hours AS NEEDED for afib heart rate over 100 45 tablet 1   No current facility-administered medications for this encounter.     No Known Allergies  Social History   Social History  . Marital status: Divorced    Spouse name: N/A  . Number of children: N/A  . Years of education: N/A   Occupational History  . Not on file.   Social History Main Topics  . Smoking status: Never Smoker  . Smokeless tobacco: Never Used  . Alcohol  use No  . Drug use: No  . Sexual activity: Not on file   Other Topics Concern  . Not on file   Social History Narrative  . No narrative on file    No family history on file.  ROS- All systems are reviewed and negative except as per the HPI above  Physical Exam: Vitals:   05/26/17 0953  BP: (!) 152/92  Pulse: 79  Weight: (!) 305 lb 12.8 oz (138.7 kg)  Height:  (1.88 m)   Wt Readings from Last 3 Encounters:  05/26/17 (!) 305 lb 12.8 oz (138.7 kg)  05/23/17 300 lb (136.1 kg)  11/24/14 290 lb (131.5 kg)    Labs: Lab Results  Component Value Date   NA 140 05/24/2017   K 3.8 05/24/2017   CL 100 (L) 05/24/2017   CO2 27 01/27/2009   GLUCOSE 134 (H) 05/24/2017   BUN 14 05/24/2017   CREATININE 1.00 05/24/2017   CALCIUM 8.7 01/27/2009   Lab Results  Component Value Date   INR 0.95 05/24/2017   No results found for: CHOL, HDL, LDLCALC, TRIG   GEN- The patient is well appearing, alert and oriented x 3 today.   Head- normocephalic, atraumatic Eyes-  Sclera clear, conjunctiva pink Ears- hearing intact Oropharynx- clear Neck- supple, no JVP Lymph- no cervical lymphadenopathy Lungs- Clear to ausculation bilaterally, normal work of breathing Heart- Regular rate and rhythm, no murmurs,  rubs or gallops, PMI not laterally displaced GI- soft, NT, ND, + BS Extremities- no clubbing, cyanosis, or edema MS- no significant deformity or atrophy Skin- no rash or lesion Psych- euthymic mood, full affect Neuro- strength and sensation are intact  EKG- NSR,79 bpn, IRBBB, pr int 148 ms, qrs int 96 ms, qtc 424 ms Epic records reviewed Atrial flutter EKG reviewed    Assessment and Plan: 1. Atrial flutter One episode so far General education re afib/flutter Encouraged to cut back on caffeine Snores and may need a sleep study down the line when he gets insurance Encouraged weight loss and exercise TSH today Add Cardizem 120 mg a day for h/o flutter and also BP not well  controlled Will also give 30 mg Cardizem if flutter should return and how to use to possibly not have to go to ER. Encouraged  to get a PCP Echo-offered to get echo later when insured  Will refer to Child psychotherapist for any resources available to him for currently being uninsured chadsvasc score of 1 for ? HTN, finish 30 days of xarelto and can stop unless echo show LV dysfunction  F/u in 3-4 weeks  Lupita Leash C. [facility] [address] [place], [place] [address] [phone]

## 2017-05-30 ENCOUNTER — Telehealth: Payer: Self-pay | Admitting: Licensed Clinical Social Worker

## 2017-05-30 NOTE — Telephone Encounter (Signed)
CSW received referral from Afib clinic to assist patient with insurance options. Patient is currently uninsured. CSW contacted patient and left message for return call. Lasandra Beech, LCSW, CCSW-[facility] [phone]

## 2017-06-02 NOTE — Addendum Note (Signed)
Encounter addended by: Lam Mccubbins C, NP on: 06/02/2017  3:12 PM<BR>    Actions taken: LOS modified

## 2017-06-06 ENCOUNTER — Ambulatory Visit (HOSPITAL_COMMUNITY): Payer: Self-pay

## 2017-06-06 ENCOUNTER — Inpatient Hospital Stay (HOSPITAL_COMMUNITY): Admission: RE | Admit: 2017-06-06 | Payer: Self-pay | Source: Ambulatory Visit | Admitting: Nurse Practitioner

## 2017-06-19 ENCOUNTER — Ambulatory Visit (HOSPITAL_COMMUNITY)
Admission: RE | Admit: 2017-06-19 | Discharge: 2017-06-19 | Disposition: A | Discharge status: Home / Self Care | Payer: Self-pay | Source: Ambulatory Visit | Attending: Nurse Practitioner | Admitting: Nurse Practitioner

## 2017-06-19 ENCOUNTER — Encounter (HOSPITAL_COMMUNITY): Payer: Self-pay | Admitting: Nurse Practitioner

## 2017-06-19 ENCOUNTER — Other Ambulatory Visit (HOSPITAL_COMMUNITY): Payer: Self-pay

## 2017-06-19 DIAGNOSIS — E669 Obesity, unspecified: Secondary | ICD-10-CM | POA: Insufficient documentation

## 2017-06-19 DIAGNOSIS — R002 Palpitations: Secondary | ICD-10-CM | POA: Insufficient documentation

## 2017-06-19 DIAGNOSIS — I480 Paroxysmal atrial fibrillation: Secondary | ICD-10-CM

## 2017-06-19 DIAGNOSIS — I4892 Unspecified atrial flutter: Secondary | ICD-10-CM | POA: Insufficient documentation

## 2017-06-19 DIAGNOSIS — I4891 Unspecified atrial fibrillation: Secondary | ICD-10-CM | POA: Insufficient documentation

## 2017-06-19 LAB — ECHOCARDIOGRAM COMPLETE
AOPV: 0.26 m/s
AV Area mean vel: 0.93 cm2
AV VEL mean LVOT/AV: 0.27
AV area mean vel ind: 0.34 cm2/m2
AV vel: 1
AVA: 1 cm2
AVAREAVTI: 0.91 cm2
AVAREAVTIIND: 0.36 cm2/m2
AVG: 33 mmHg
AVLVOTPG: 4 mmHg
AVPG: 58 mmHg
AVPKVEL: 381 cm/s
CHL CUP AV PEAK INDEX: 0.33
CHL CUP DOP CALC LVOT VTI: 24.7 cm
CHL CUP TV REG PEAK VELOCITY: 213 cm/s
DOP CAL AO MEAN VELOCITY: 268 cm/s
E decel time: 190 msec
E/e' ratio: 9.09
FS: 32 % (ref 28–44)
IVS/LV PW RATIO, ED: 1.04
LA ID, A-P, ES: 45 mm
LA diam end sys: 45 mm
LA vol A4C: 101 ml
LA vol: 104 mL
LADIAMINDEX: 1.64 cm/m2
LAVOLIN: 37.9 mL/m2
LV PW d: 15.4 mm — AB (ref 0.6–1.1)
LV e' LATERAL: 8.38 cm/s
LVEEAVG: 9.09
LVEEMED: 9.09
LVOT area: 3.46 cm2
LVOT peak VTI: 0.29 cm
LVOT peak vel: 99.8 cm/s
LVOTD: 21 mm
LVOTSV: 85 mL
MV Dec: 190
MV pk A vel: 66.5 m/s
MV pk E vel: 76.2 m/s
MVPG: 2 mmHg
RV sys press: 21 mmHg
TAPSE: 26.9 mm
TDI e' lateral: 8.38
TDI e' medial: 6.42
TR max vel: 213 cm/s
VTI: 85.7 cm
Valve area index: 0.36

## 2017-06-19 NOTE — Progress Notes (Signed)
Pt in today for a BP check and HR.  Pt BP today 132/94 and Pts HR 70.  To be reviewed by Rudi Coco, NP

## 2018-04-20 ENCOUNTER — Emergency Department (HOSPITAL_COMMUNITY)
Admission: EM | Admit: 2018-04-20 | Discharge: 2018-04-20 | Disposition: A | Discharge status: Home / Self Care | Payer: Self-pay | Attending: Emergency Medicine | Admitting: Emergency Medicine

## 2018-04-20 ENCOUNTER — Emergency Department (HOSPITAL_COMMUNITY): Payer: Self-pay

## 2018-04-20 ENCOUNTER — Other Ambulatory Visit: Payer: Self-pay

## 2018-04-20 ENCOUNTER — Encounter (HOSPITAL_COMMUNITY): Payer: Self-pay | Admitting: Emergency Medicine

## 2018-04-20 DIAGNOSIS — R0602 Shortness of breath: Secondary | ICD-10-CM | POA: Insufficient documentation

## 2018-04-20 DIAGNOSIS — M79662 Pain in left lower leg: Secondary | ICD-10-CM | POA: Insufficient documentation

## 2018-04-20 DIAGNOSIS — R011 Cardiac murmur, unspecified: Secondary | ICD-10-CM | POA: Insufficient documentation

## 2018-04-20 DIAGNOSIS — E860 Dehydration: Secondary | ICD-10-CM | POA: Insufficient documentation

## 2018-04-20 DIAGNOSIS — R600 Localized edema: Secondary | ICD-10-CM | POA: Insufficient documentation

## 2018-04-20 LAB — CBC WITH DIFFERENTIAL/PLATELET
Basophils Absolute: 0 10*3/uL (ref 0.0–0.1)
Basophils Relative: 0 %
EOS ABS: 0.1 10*3/uL (ref 0.0–0.7)
Eosinophils Relative: 1 %
HCT: 40.6 % (ref 39.0–52.0)
HEMOGLOBIN: 13.9 g/dL (ref 13.0–17.0)
Lymphocytes Relative: 30 %
Lymphs Abs: 1.7 10*3/uL (ref 0.7–4.0)
MCH: 28.8 pg (ref 26.0–34.0)
MCHC: 34.2 g/dL (ref 30.0–36.0)
MCV: 84.1 fL (ref 78.0–100.0)
Monocytes Absolute: 0.3 10*3/uL (ref 0.1–1.0)
Monocytes Relative: 6 %
Neutro Abs: 3.4 10*3/uL (ref 1.7–7.7)
Neutrophils Relative %: 63 %
PLATELETS: 214 10*3/uL (ref 150–400)
RBC: 4.83 MIL/uL (ref 4.22–5.81)
RDW: 14 % (ref 11.5–15.5)
WBC: 5.5 10*3/uL (ref 4.0–10.5)

## 2018-04-20 LAB — URINALYSIS, ROUTINE W REFLEX MICROSCOPIC
Bilirubin Urine: NEGATIVE
Glucose, UA: NEGATIVE mg/dL
Hgb urine dipstick: NEGATIVE
Ketones, ur: NEGATIVE mg/dL
Leukocytes, UA: NEGATIVE
Nitrite: NEGATIVE
PH: 5 (ref 5.0–8.0)
Protein, ur: NEGATIVE mg/dL
Specific Gravity, Urine: 1.021 (ref 1.005–1.030)

## 2018-04-20 LAB — COMPREHENSIVE METABOLIC PANEL
ALK PHOS: 71 U/L (ref 38–126)
ALT: 26 U/L (ref 0–44)
AST: 21 U/L (ref 15–41)
Albumin: 3.6 g/dL (ref 3.5–5.0)
Anion gap: 7 (ref 5–15)
BUN: 8 mg/dL (ref 6–20)
CALCIUM: 8.4 mg/dL — AB (ref 8.9–10.3)
CO2: 26 mmol/L (ref 22–32)
CREATININE: 1.06 mg/dL (ref 0.61–1.24)
Chloride: 107 mmol/L (ref 98–111)
GFR calc Af Amer: >60 mL/min (ref >60)
GFR calc non Af Amer: >60 mL/min (ref >60)
Glucose, Bld: 135 mg/dL — ABNORMAL HIGH (ref 70–99)
Potassium: 3.8 mmol/L (ref 3.5–5.1)
SODIUM: 140 mmol/L (ref 135–145)
Total Bilirubin: 0.9 mg/dL (ref 0.3–1.2)
Total Protein: 6.8 g/dL (ref 6.5–8.1)

## 2018-04-20 LAB — D-DIMER, QUANTITATIVE (NOT AT ARMC): D DIMER QUANT: 0.51 ug{FEU}/mL — AB (ref 0.00–0.50)

## 2018-04-20 LAB — CBG MONITORING, ED: Glucose-Capillary: 123 mg/dL — ABNORMAL HIGH (ref 70–99)

## 2018-04-20 LAB — CK: Total CK: 111 U/L (ref 49–397)

## 2018-04-20 LAB — BRAIN NATRIURETIC PEPTIDE: B Natriuretic Peptide: 66 pg/mL (ref 0.0–100.0)

## 2018-04-20 MED ORDER — SODIUM CHLORIDE 0.9 % IV BOLUS
1000.0000 mL | Freq: Once | INTRAVENOUS | Status: AC
  Administered 2018-04-20: 1000 mL via INTRAVENOUS

## 2018-04-20 NOTE — ED Provider Notes (Signed)
[facility] EMERGENCY DEPARTMENT Provider Note   CSN: [id number] Arrival date & time: 04/20/18  1251     History   Chief Complaint Chief Complaint  Patient presents with  . Dizziness    HPI Johnathan Richmond is a 57 y.o. male.  HPI  57 year old male presents with lightheadedness for 2 or 3 days.  It is most prominent when at work which he states is very hot and about 110 degrees and side.  He still has some of the symptoms at home although it seems to be better when sitting down and drinking cold water.  Seems to be worse when getting up.  There is no headache besides the vague lightheadedness feeling.  He has not passed out.  He does not feel ataxic and there is no room spinning sensation.  No new blurry vision, vomiting, chest pain, abdominal pain or diarrhea.  No weakness or numbness in the extremities.  He does note months worth of bilateral lower extremity edema and some mild shortness of breath.  He also states that he is been having a pain behind his left knee for about 2 days.  No unilateral leg swelling. Has had dark urine for "a while". Thinks its from drinking [facility].  History reviewed. No pertinent past medical history.  There are no active problems to display for this patient.   Past Surgical History:  Procedure Laterality Date  . CHOLECYSTECTOMY          Home Medications    Prior to Admission medications   Medication Sig Start Date End Date Taking? Authorizing Provider  diltiazem (CARDIZEM CD) 120 MG 24 hr capsule Take 1 capsule (120 mg total) by mouth daily. Patient not taking: Reported on 04/20/2018 05/26/17 05/26/18  Newman Nip, NP  diltiazem (CARDIZEM) 30 MG tablet Take 1 tablet every 4 hours AS NEEDED for afib heart rate over 100 Patient not taking: Reported on 04/20/2018 05/26/17   Newman Nip, NP  rivaroxaban (XARELTO) 20 MG TABS tablet Take 1 tablet (20 mg total) by mouth daily with supper. Patient not taking: Reported on 04/20/2018 05/24/17    Zadie Rhine, MD    Family History History reviewed. No pertinent family history.  Social History Social History   Tobacco Use  . Smoking status: Never Smoker  . Smokeless tobacco: Never Used  Substance Use Topics  . Alcohol use: No  . Drug use: No     Allergies   Patient has no known allergies.   Review of Systems Review of Systems  Eyes: Positive for visual disturbance (chronic blurry vision x 1 year - unchanged).  Respiratory: Positive for shortness of breath.   Cardiovascular: Positive for leg swelling. Negative for chest pain.  Gastrointestinal: Negative for abdominal pain, diarrhea and vomiting.  Musculoskeletal: Negative for myalgias.  Neurological: Positive for light-headedness. Negative for syncope, weakness, numbness and headaches.  All other systems reviewed and are negative.    Physical Exam Updated Vital Signs BP (!) 144/98   Pulse 72   Temp 98.1 F (36.7 C) (Oral)   Resp 14   Ht 6\' 2"  (1.88 m)   Wt (!) 137.9 kg   SpO2 100%   BMI 39.03 kg/m   Physical Exam  Constitutional: He is oriented to person, place, and time. He appears well-developed and well-nourished. No distress.  HENT:  Head: Normocephalic and atraumatic.  Right Ear: External ear normal.  Left Ear: External ear normal.  Nose: Nose normal.  Eyes: Pupils are equal, round,  and reactive to light. EOM are normal. Right eye exhibits no discharge. Left eye exhibits no discharge.  Neck: Normal range of motion. Neck supple.  Cardiovascular: Normal rate and regular rhythm.  Murmur heard. Pulmonary/Chest: Effort normal and breath sounds normal. He has no wheezes. He has no rales.  Abdominal: Soft. There is no tenderness.  Musculoskeletal: He exhibits edema (mild, 1+ pitting edema of BLE).  No reproducible popliteal tenderness or swelling noted to LLE  Neurological: He is alert and oriented to person, place, and time.  CN 3-12 grossly intact. 5/5 strength in all 4 extremities. Grossly  normal sensation. Normal finger to nose.   Skin: Skin is warm and dry. He is not diaphoretic.  Nursing note and vitals reviewed.    ED Treatments / Results  Labs (all labs ordered are listed, but only abnormal results are displayed) Labs Reviewed  COMPREHENSIVE METABOLIC PANEL - Abnormal; Notable for the following components:      Result Value   Glucose, Bld 135 (*)    Calcium 8.4 (*)    All other components within normal limits  D-DIMER, QUANTITATIVE (NOT AT [facility]) - Abnormal; Notable for the following components:   D-Dimer, Quant 0.51 (*)    All other components within normal limits  CBG MONITORING, ED - Abnormal; Notable for the following components:   Glucose-Capillary 123 (*)    All other components within normal limits  BRAIN NATRIURETIC PEPTIDE  CBC WITH DIFFERENTIAL/PLATELET  URINALYSIS, ROUTINE W REFLEX MICROSCOPIC  CK    EKG EKG Interpretation  Date/Time:  Friday April 20 2018 13:10:19 EDT Ventricular Rate:  90 PR Interval:    QRS Duration: 103 QT Interval:  374 QTC Calculation: 458 R Axis:   -24 Text Interpretation:  Sinus rhythm Incomplete RBBB and LAFB RSR' in V1 or V2, right VCD or RVH nonspecific T waves improved but overall similar to Sept 2018 Confirmed by Pricilla LovelessGoldston, Geramy Lamorte [phone]([id number]) on 04/20/2018 1:33:21 PM   Radiology Dg Chest 2 View  Result Date: 04/20/2018 CLINICAL DATA:  Dizziness and headache EXAM: CHEST - 2 VIEW COMPARISON:  None. FINDINGS: There is mild atelectatic change in the left lower lung zone. There is no edema or consolidation. The heart size and pulmonary vascularity are normal. No adenopathy. There is degenerative change in the midthoracic region. IMPRESSION: Slight atelectasis left lower lobe. No edema or consolidation. Heart size normal. Electronically Signed   By: Bretta BangWilliam  Woodruff III M.D.   On: 04/20/2018 14:19    Procedures Procedures (including critical care time)  Medications Ordered in ED Medications  sodium chloride 0.9 %  bolus 1,000 mL (0 mLs Intravenous Stopped 04/20/18 1529)     Initial Impression / Assessment and Plan / ED Course  I have reviewed the triage vital signs and the nursing notes.  Pertinent labs & imaging results that were available during my care of the patient were reviewed by me and considered in my medical decision making (see chart for details).     Patient's dehydration is probably related to the heat from the work environment as well as some dehydration.  Neuro exam benign.  He has a murmur but states he is always had a murmur.  He does not have any chest symptoms.  He has some vague shortness of breath and leg swelling for months but there does not appear to be evidence of acute heart failure.  The vague pain behind his left leg was evaluated with a d-dimer as he does not have objective findings of DVT  in the setting of near syncope.  Age adjusted d-dimer is negative.  No symptoms consistent with ACS.  He feels better with fluids.  He was encouraged to drink plenty of fluids at home and follow-up with PCP.  Return precautions.  Final Clinical Impressions(s) / ED Diagnoses   Final diagnoses:  Dehydration    ED Discharge Orders    None       Pricilla LovelessGoldston, Yuko Coventry, MD 04/20/18 1542

## 2018-04-20 NOTE — Discharge Instructions (Addendum)
If your dizziness recurs or worsens, you develop headache, vomiting, chest pain or you develop any other new/concerning symptoms then return to the ER for evaluation. Otherwise be sure to drink plenty of fluids.

## 2018-04-20 NOTE — ED Triage Notes (Signed)
Patient complaining of lightheadedness x 2-3 days. 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 Also complaining of "slight headache."

## 2018-04-20 NOTE — ED Notes (Signed)
Pt taken to xray 

## 2019-04-02 ENCOUNTER — Other Ambulatory Visit: Payer: Self-pay

## 2019-04-02 DIAGNOSIS — Z20822 Contact with and (suspected) exposure to covid-19: Secondary | ICD-10-CM

## 2019-04-04 LAB — NOVEL CORONAVIRUS, NAA: SARS-CoV-2, NAA: NOT DETECTED

## 2019-04-09 ENCOUNTER — Telehealth: Payer: Self-pay | Admitting: *Deleted

## 2019-04-09 NOTE — Telephone Encounter (Signed)
Pt returning call for covid test results, negative; pt verbalizes understanding

## 2019-08-28 ENCOUNTER — Observation Stay (HOSPITAL_COMMUNITY)
Admission: EM | Admit: 2019-08-28 | Discharge: 2019-08-29 | Disposition: A | Discharge status: Home / Self Care | Payer: Self-pay | Attending: Cardiology | Admitting: Cardiology

## 2019-08-28 ENCOUNTER — Emergency Department (HOSPITAL_COMMUNITY): Payer: No Typology Code available for payment source

## 2019-08-28 ENCOUNTER — Other Ambulatory Visit: Payer: Self-pay

## 2019-08-28 ENCOUNTER — Encounter (HOSPITAL_COMMUNITY): Payer: Self-pay

## 2019-08-28 DIAGNOSIS — E039 Hypothyroidism, unspecified: Secondary | ICD-10-CM | POA: Insufficient documentation

## 2019-08-28 DIAGNOSIS — R011 Cardiac murmur, unspecified: Secondary | ICD-10-CM | POA: Insufficient documentation

## 2019-08-28 DIAGNOSIS — I350 Nonrheumatic aortic (valve) stenosis: Secondary | ICD-10-CM

## 2019-08-28 DIAGNOSIS — E038 Other specified hypothyroidism: Secondary | ICD-10-CM | POA: Insufficient documentation

## 2019-08-28 DIAGNOSIS — R9431 Abnormal electrocardiogram [ECG] [EKG]: Secondary | ICD-10-CM | POA: Insufficient documentation

## 2019-08-28 DIAGNOSIS — R600 Localized edema: Secondary | ICD-10-CM

## 2019-08-28 DIAGNOSIS — R42 Dizziness and giddiness: Secondary | ICD-10-CM | POA: Insufficient documentation

## 2019-08-28 DIAGNOSIS — Z79899 Other long term (current) drug therapy: Secondary | ICD-10-CM | POA: Insufficient documentation

## 2019-08-28 DIAGNOSIS — I4892 Unspecified atrial flutter: Secondary | ICD-10-CM | POA: Insufficient documentation

## 2019-08-28 DIAGNOSIS — Z8679 Personal history of other diseases of the circulatory system: Secondary | ICD-10-CM

## 2019-08-28 DIAGNOSIS — I119 Hypertensive heart disease without heart failure: Secondary | ICD-10-CM | POA: Insufficient documentation

## 2019-08-28 DIAGNOSIS — E0800 Diabetes mellitus due to underlying condition with hyperosmolarity without nonketotic hyperglycemic-hyperosmolar coma (NKHHC): Secondary | ICD-10-CM

## 2019-08-28 DIAGNOSIS — R0600 Dyspnea, unspecified: Secondary | ICD-10-CM | POA: Insufficient documentation

## 2019-08-28 DIAGNOSIS — Z20828 Contact with and (suspected) exposure to other viral communicable diseases: Secondary | ICD-10-CM | POA: Insufficient documentation

## 2019-08-28 DIAGNOSIS — R079 Chest pain, unspecified: Secondary | ICD-10-CM

## 2019-08-28 DIAGNOSIS — I10 Essential (primary) hypertension: Secondary | ICD-10-CM

## 2019-08-28 DIAGNOSIS — Z7901 Long term (current) use of anticoagulants: Secondary | ICD-10-CM | POA: Insufficient documentation

## 2019-08-28 DIAGNOSIS — E119 Type 2 diabetes mellitus without complications: Secondary | ICD-10-CM

## 2019-08-28 DIAGNOSIS — R0789 Other chest pain: Principal | ICD-10-CM | POA: Diagnosis present

## 2019-08-28 DIAGNOSIS — I4891 Unspecified atrial fibrillation: Secondary | ICD-10-CM | POA: Insufficient documentation

## 2019-08-28 HISTORY — DX: Nonrheumatic aortic (valve) stenosis: I35.0

## 2019-08-28 HISTORY — DX: Morbid (severe) obesity due to excess calories: E66.01

## 2019-08-28 HISTORY — DX: Unspecified atrial flutter: I48.92

## 2019-08-28 HISTORY — DX: Hypothyroidism, unspecified: E03.9

## 2019-08-28 HISTORY — DX: Type 2 diabetes mellitus without complications: E11.9

## 2019-08-28 HISTORY — DX: Essential (primary) hypertension: I10

## 2019-08-28 LAB — COMPREHENSIVE METABOLIC PANEL
ALT: 20 U/L (ref 0–44)
AST: 16 U/L (ref 15–41)
Albumin: 3.5 g/dL (ref 3.5–5.0)
Alkaline Phosphatase: 79 U/L (ref 38–126)
Anion gap: 8 (ref 5–15)
BUN: 8 mg/dL (ref 6–20)
CO2: 25 mmol/L (ref 22–32)
Calcium: 8.8 mg/dL — ABNORMAL LOW (ref 8.9–10.3)
Chloride: 107 mmol/L (ref 98–111)
Creatinine, Ser: 0.92 mg/dL (ref 0.61–1.24)
GFR calc Af Amer: >60 mL/min (ref >60)
GFR calc non Af Amer: >60 mL/min (ref >60)
Glucose, Bld: 121 mg/dL — ABNORMAL HIGH (ref 70–99)
Potassium: 4.1 mmol/L (ref 3.5–5.1)
Sodium: 140 mmol/L (ref 135–145)
Total Bilirubin: 0.8 mg/dL (ref 0.3–1.2)
Total Protein: 7.1 g/dL (ref 6.5–8.1)

## 2019-08-28 LAB — POC SARS CORONAVIRUS 2 AG -  ED: SARS Coronavirus 2 Ag: NEGATIVE

## 2019-08-28 LAB — TROPONIN I (HIGH SENSITIVITY)
Troponin I (High Sensitivity): 6 ng/L (ref <18)
Troponin I (High Sensitivity): 6 ng/L (ref <18)

## 2019-08-28 LAB — CBC WITH DIFFERENTIAL/PLATELET
Abs Immature Granulocytes: 0.01 10*3/uL (ref 0.00–0.07)
Basophils Absolute: 0 10*3/uL (ref 0.0–0.1)
Basophils Relative: 1 %
Eosinophils Absolute: 0.1 10*3/uL (ref 0.0–0.5)
Eosinophils Relative: 1 %
HCT: 45.2 % (ref 39.0–52.0)
Hemoglobin: 14.6 g/dL (ref 13.0–17.0)
Immature Granulocytes: 0 %
Lymphocytes Relative: 30 %
Lymphs Abs: 1.7 10*3/uL (ref 0.7–4.0)
MCH: 27.5 pg (ref 26.0–34.0)
MCHC: 32.3 g/dL (ref 30.0–36.0)
MCV: 85.3 fL (ref 80.0–100.0)
Monocytes Absolute: 0.5 10*3/uL (ref 0.1–1.0)
Monocytes Relative: 8 %
Neutro Abs: 3.4 10*3/uL (ref 1.7–7.7)
Neutrophils Relative %: 60 %
Platelets: 234 10*3/uL (ref 150–400)
RBC: 5.3 MIL/uL (ref 4.22–5.81)
RDW: 14.3 % (ref 11.5–15.5)
WBC: 5.7 10*3/uL (ref 4.0–10.5)
nRBC: 0 % (ref 0.0–0.2)

## 2019-08-28 LAB — BRAIN NATRIURETIC PEPTIDE: B Natriuretic Peptide: 51 pg/mL (ref 0.0–100.0)

## 2019-08-28 LAB — LIPID PANEL
Cholesterol: 193 mg/dL (ref 0–200)
HDL: 40 mg/dL — ABNORMAL LOW (ref >40)
LDL Cholesterol: 134 mg/dL — ABNORMAL HIGH (ref 0–99)
Total CHOL/HDL Ratio: 4.8 RATIO
Triglycerides: 97 mg/dL (ref <150)
VLDL: 19 mg/dL (ref 0–40)

## 2019-08-28 LAB — PROTIME-INR
INR: 1 (ref 0.8–1.2)
Prothrombin Time: 12.9 seconds (ref 11.4–15.2)

## 2019-08-28 LAB — T4, FREE: Free T4: 0.77 ng/dL (ref 0.61–1.12)

## 2019-08-28 LAB — RESPIRATORY PANEL BY RT PCR (FLU A&B, COVID)
Influenza A by PCR: NEGATIVE
Influenza B by PCR: NEGATIVE
SARS Coronavirus 2 by RT PCR: NEGATIVE

## 2019-08-28 LAB — HIV ANTIBODY (ROUTINE TESTING W REFLEX): HIV Screen 4th Generation wRfx: NONREACTIVE

## 2019-08-28 LAB — TSH: TSH: 7.935 u[IU]/mL — ABNORMAL HIGH (ref 0.350–4.500)

## 2019-08-28 LAB — HEMOGLOBIN A1C
Hgb A1c MFr Bld: 6.7 % — ABNORMAL HIGH (ref 4.8–5.6)
Mean Plasma Glucose: 145.59 mg/dL

## 2019-08-28 LAB — APTT: aPTT: 31 seconds (ref 24–36)

## 2019-08-28 MED ORDER — ONDANSETRON HCL 4 MG/2ML IJ SOLN: 4.0000 mg | Freq: Four times a day (QID) | INTRAMUSCULAR | Status: DC | PRN

## 2019-08-28 MED ORDER — ACETAMINOPHEN 325 MG PO TABS: 650.0000 mg | ORAL_TABLET | ORAL | Status: DC | PRN

## 2019-08-28 MED ORDER — SODIUM CHLORIDE 0.9 % IV SOLN: 250.0000 mL | INTRAVENOUS | Status: DC | PRN

## 2019-08-28 MED ORDER — ENOXAPARIN SODIUM 40 MG/0.4ML ~~LOC~~ SOLN: 40.0000 mg | SUBCUTANEOUS | Status: DC

## 2019-08-28 MED ORDER — PANTOPRAZOLE SODIUM 40 MG PO TBEC
40.0000 mg | DELAYED_RELEASE_TABLET | Freq: Every day | ORAL | Status: DC
  Administered 2019-08-28: 40 mg via ORAL
  Filled 2019-08-28: qty 1

## 2019-08-28 MED ORDER — SODIUM CHLORIDE 0.9% FLUSH
3.0000 mL | Freq: Two times a day (BID) | INTRAVENOUS | Status: DC
  Administered 2019-08-28 – 2019-08-29 (×2): 3 mL via INTRAVENOUS

## 2019-08-28 MED ORDER — SODIUM CHLORIDE 0.9% FLUSH: 3.0000 mL | INTRAVENOUS | Status: DC | PRN

## 2019-08-28 MED ORDER — HEPARIN SODIUM (PORCINE) 5000 UNIT/ML IJ SOLN
4000.0000 [IU] | Freq: Once | INTRAMUSCULAR | Status: AC
  Administered 2019-08-28: 4000 [IU] via INTRAVENOUS
  Filled 2019-08-28: qty 0.8
  Filled 2019-08-28: qty 1

## 2019-08-28 MED ORDER — ASPIRIN 81 MG PO CHEW
324.0000 mg | CHEWABLE_TABLET | Freq: Once | ORAL | Status: AC
  Administered 2019-08-28: 324 mg via ORAL
  Filled 2019-08-28 (×2): qty 4

## 2019-08-28 MED ORDER — SODIUM CHLORIDE 0.9 % IV SOLN: INTRAVENOUS | Status: DC

## 2019-08-28 MED ORDER — ENOXAPARIN SODIUM 40 MG/0.4ML ~~LOC~~ SOLN
40.0000 mg | SUBCUTANEOUS | Status: DC
  Administered 2019-08-29: 40 mg via SUBCUTANEOUS
  Filled 2019-08-28: qty 0.4

## 2019-08-28 NOTE — ED Triage Notes (Signed)
Pt reports 1 week of generalized fatigue, chest tightness, sob and leg swelling for the past week. Pt a.o resp e.u at this time. EKG showing t wave inversions

## 2019-08-28 NOTE — Plan of Care (Signed)

## 2019-08-28 NOTE — H&P (Signed)
Cardiology Admission History and Physical:   Patient ID: Johnathan Richmond MRN: [id number]; DOB: 09/17/1960   Admission date: 08/28/2019  Primary Care Provider: Patient, No Pcp Per Primary Cardiologist: No primary care provider on file.  Primary Electrophysiologist:  None   Chief Complaint:  Chest tightness  Patient Profile:   Johnathan Richmond is a 58 y.o. male with atrial flutter, aortic stenosis, hypothyroidism who presents with lightheadedness, chest tightness, and lower extremity edema.   History of Present Illness:   Johnathan Richmond is a 58 y.o. male with atrial flutter, aortic stenosis, hypothyroidism who presents with lightheadedness, chest tightness, and lower extremity edema.  Reports that he was in his usual state of health until about 1 week ago.  Started having tightness in his chest.  Reports tightness has been continuous for the past week.  Has not noticed anything that makes it better or worse.  In addition he has noted swelling in both legs that has been progressive.  He also has started feeling lightheaded, which is what prompted him to come to the ED today.  States that he was just driving when he began to feel lightheaded.  No syncopal episodes.  He was diagnosed with atrial flutter after presented to the ED in 2018.  He followed up in the atrial fibrillation clinic and was started on Cardizem and Xarelto.  Given CHADS-VASc 1 (possible hypertension?),  He was told to only take Xarelto for 30 days and then could stop.  TTE in 06/2017 showed EF 60 to 65%, moderate LVH, grade 2 diastolic dysfunction, moderate aortic stenosis.  He was also found of hypothyroidism and started on medication.  He reports that he has had no medical follow-up over the last few years.   Heart Pathway Score:     History reviewed. No pertinent past medical history.  Past Surgical History:  Procedure Laterality Date  . CHOLECYSTECTOMY       Medications Prior to Admission: Prior to Admission  medications   Medication Sig Start Date End Date Taking? Authorizing Provider  diltiazem (CARDIZEM CD) 120 MG 24 hr capsule Take 1 capsule (120 mg total) by mouth daily. Patient not taking: Reported on 04/20/2018 05/26/17 05/26/18  Sherran Needs, NP  diltiazem (CARDIZEM) 30 MG tablet Take 1 tablet every 4 hours AS NEEDED for afib heart rate over 100 Patient not taking: Reported on 04/20/2018 05/26/17   Sherran Needs, NP  rivaroxaban (XARELTO) 20 MG TABS tablet Take 1 tablet (20 mg total) by mouth daily with supper. Patient not taking: Reported on 04/20/2018 05/24/17   Ripley Fraise, MD     Allergies:   No Known Allergies  Social History:   Social History   Socioeconomic History  . Marital status: Divorced    Spouse name: Not on file  . Number of children: Not on file  . Years of education: Not on file  . Highest education level: Not on file  Occupational History  . Not on file  Tobacco Use  . Smoking status: Never Smoker  . Smokeless tobacco: Never Used  Substance and Sexual Activity  . Alcohol use: No  . Drug use: No  . Sexual activity: Not on file  Other Topics Concern  . Not on file  Social History Narrative  . Not on file   Social Determinants of Health   Financial Resource Strain:   . Difficulty of Paying Living Expenses: Not on file  Food Insecurity:   . Worried About Charity fundraiser in  the Last Year: Not on file  . Ran Out of Food in the Last Year: Not on file  Transportation Needs:   . Lack of Transportation (Medical): Not on file  . Lack of Transportation (Non-Medical): Not on file  Physical Activity:   . Days of Exercise per Week: Not on file  . Minutes of Exercise per Session: Not on file  Stress:   . Feeling of Stress : Not on file  Social Connections:   . Frequency of Communication with Friends and Family: Not on file  . Frequency of Social Gatherings with Friends and Family: Not on file  . Attends Religious Services: Not on file  . Active  Member of Clubs or Organizations: Not on file  . Attends BankerClub or Organization Meetings: Not on file  . Marital Status: Not on file  Intimate Partner Violence:   . Fear of Current or Ex-Partner: Not on file  . Emotionally Abused: Not on file  . Physically Abused: Not on file  . Sexually Abused: Not on file    Family History:   Reports no history of heart disease in immediate family  ROS:  Please see the history of present illness.  All other ROS reviewed and negative.     Physical Exam/Data:   Vitals:   08/28/19 1900 08/28/19 1915 08/28/19 1930 08/28/19 2000  BP: 126/80 140/77 (!) 143/91 139/88  Pulse: 68 72 66 63  Resp: 12 14 12 19   Temp:      TempSrc:      SpO2: 99% 99% 98% 98%   No intake or output data in the 24 hours ending 08/28/19 2032 Last 3 Weights 04/20/2018 05/26/2017 05/23/2017  Weight (lbs) 304 lb 305 lb 12.8 oz 300 lb  Weight (kg) 137.893 kg 138.71 kg 136.079 kg     There is no height or weight on file to calculate BMI.  General:  Well nourished, well developed, in no acute distress HEENT: normal Lymph: no adenopathy Neck: no JVD Vascular: No carotid bruits Cardiac:  normal S1, S2; RRR;3/6 systolic mumur loudest at RUSB Lungs:  clear to auscultation bilaterally, no wheezing, rhonchi or rales  Abd: soft, nontender, no hepatomegaly  Ext: 1+ BLE edema Musculoskeletal:  No deformities, BUE and BLE strength normal and equal Skin: warm and dry  Neuro:  CNs 2-12 intact, no focal abnormalities noted Psych:  Normal affect    EKG:  The ECG that was done  was personally reviewed and demonstrates normal sinus rhythm, rate 73, T wave inversions in leads I, 2, 3, V4-6  Relevant CV Studies: TTE 06/19/17: - Left ventricle: The cavity size was normal. Wall thickness was   increased in a pattern of moderate LVH. Systolic function was   normal. The estimated ejection fraction was in the range of 60%   to 65%. Wall motion was normal; there were no regional wall   motion  abnormalities. Features are consistent with a pseudonormal   left ventricular filling pattern, with concomitant abnormal   relaxation and increased filling pressure (grade 2 diastolic   dysfunction). - Aortic valve: Moderately calcified annulus. There was moderate   stenosis. There was trivial regurgitation. Valve area (VTI): 1   cm^2. Valve area (Vmax): 0.91 cm^2. Valve area (Vmean): 0.93   cm^2. - Left atrium: The atrium was moderately dilated. - Right atrium: The atrium was moderately dilated.  Laboratory Data:  High Sensitivity Troponin:   Recent Labs  Lab 08/28/19 1809  TROPONINIHS 6  Chemistry Recent Labs  Lab 08/28/19 1809  NA 140  K 4.1  CL 107  CO2 25  GLUCOSE 121*  BUN 8  CREATININE 0.92  CALCIUM 8.8*  GFRNONAA >60  GFRAA >60  ANIONGAP 8    Recent Labs  Lab 08/28/19 1809  PROT 7.1  ALBUMIN 3.5  AST 16  ALT 20  ALKPHOS 79  BILITOT 0.8   Hematology Recent Labs  Lab 08/28/19 1809  WBC 5.7  RBC 5.30  HGB 14.6  HCT 45.2  MCV 85.3  MCH 27.5  MCHC 32.3  RDW 14.3  PLT 234   BNPNo results for input(s): BNP, PROBNP in the last 168 hours.  DDimer No results for input(s): DDIMER in the last 168 hours.   Radiology/Studies:  DG Chest Port 1 View  Result Date: 08/28/2019 CLINICAL DATA:  58 year old male with shortness of breath and fatigue. EXAM: PORTABLE CHEST 1 VIEW COMPARISON:  Chest radiograph dated 04/20/2018. FINDINGS: The lungs are clear. There is no pleural effusion or pneumothorax. The cardiac silhouette is within normal limits. No acute osseous pathology. IMPRESSION: No active disease. Electronically Signed   By: Elgie Collard M.D.   On: 08/28/2019 19:11       HEAR Score (for undifferentiated chest pain):       Assessment and Plan:   Chest pain: Atypical in description, has describes continuous substernal tightness occurring for 1 week.  Initial troponin negative.  EKG with diffuse T wave inversions. -We will check another  troponin.  If troponins remain negative then given continuous chest pain with negative troponin suspect noncardiac pain.  Will start empiric PPI  Lightheadedness: Had moderate aortic stenosis on last TTE in 2018.  Will check TTE to evaluate for progression  Lower extremity edema: Mild edema.  BNP normal.  Will check lower extremity duplex  Atrial flutter: Single episode reported in 2018.  Was started on diltiazem, has not been taking -We will restart diltiazem given atrial flutter and likely untreated hypertension  Hypothyroidism: Reported diagnosis, he states that he stopped taking his medication 2 years ago.  Will check TSH   Severity of Illness: The appropriate patient status for this patient is OBSERVATION. Observation status is judged to be reasonable and necessary in order to provide the required intensity of service to ensure the patient's safety. The patient's presenting symptoms, physical exam findings, and initial radiographic and laboratory data in the context of their medical condition is felt to place them at decreased risk for further clinical deterioration. Furthermore, it is anticipated that the patient will be medically stable for discharge from the hospital within 2 midnights of admission. The following factors support the patient status of observation.   " The patient's presenting symptoms include chest pain, lightheadedness " The physical exam findings include LE edema. " The initial radiographic and laboratory data are inaitial troponin negative.     For questions or updates, please contact [facility] Please consult [email or web address] for contact info under        Signed, Little Ishikawa, MD  08/28/2019 8:32 PM

## 2019-08-28 NOTE — ED Provider Notes (Signed)
[facility] EMERGENCY DEPARTMENT Provider Note   CSN: [id number] Arrival date & time: 08/28/19  1753     History Chief Complaint  Patient presents with  . Chest Pain  . Shortness of Breath  . Leg Swelling    Johnathan Richmond is a 58 y.o. male.  HPI Patient presents with a week or 2 of fatigue shortness of breath mild chest tightness without pain.  Worsening swelling his legs.  States he has been working 14-hour days and has had difficulty working it.  States he has to take a break because he gets short of breath.  No frank chest pain but does have some chest tightness at times.  Seen by PCP and sent in for EKG changes.    History reviewed. No pertinent past medical history.  There are no problems to display for this patient.   Past Surgical History:  Procedure Laterality Date  . CHOLECYSTECTOMY         No family history on file.  Social History   Tobacco Use  . Smoking status: Never Smoker  . Smokeless tobacco: Never Used  Substance Use Topics  . Alcohol use: No  . Drug use: No    Home Medications Prior to Admission medications   Medication Sig Start Date End Date Taking? Authorizing Provider  diltiazem (CARDIZEM CD) 120 MG 24 hr capsule Take 1 capsule (120 mg total) by mouth daily. Patient not taking: Reported on 04/20/2018 05/26/17 05/26/18  Newman Nip, NP  diltiazem (CARDIZEM) 30 MG tablet Take 1 tablet every 4 hours AS NEEDED for afib heart rate over 100 Patient not taking: Reported on 04/20/2018 05/26/17   Newman Nip, NP  rivaroxaban (XARELTO) 20 MG TABS tablet Take 1 tablet (20 mg total) by mouth daily with supper. Patient not taking: Reported on 04/20/2018 05/24/17   Zadie Rhine, MD    Allergies    Patient has no known allergies.  Review of Systems   Review of Systems  Constitutional: Negative for appetite change.  HENT: Positive for congestion.   Respiratory: Positive for shortness of breath.   Cardiovascular:  Positive for leg swelling. Negative for chest pain.  Gastrointestinal: Negative for abdominal pain.  Genitourinary: Negative for flank pain.  Musculoskeletal: Negative for back pain.  Skin: Negative for rash.  Neurological: Negative for weakness.    Physical Exam Updated Vital Signs BP (!) 141/78 (BP Location: Right Arm)   Pulse 69   Temp 98.1 F (36.7 C) (Oral)   Resp 16   SpO2 100%   Physical Exam Vitals and nursing note reviewed.  HENT:     Head: Normocephalic.  Cardiovascular:     Rate and Rhythm: Regular rhythm.     Heart sounds: Murmur present. Systolic murmur present.     Comments: Systolic murmur loudest R upper sternal border. Pulmonary:     Effort: No tachypnea.  Chest:     Chest wall: Edema present.  Abdominal:     Palpations: There is no splenomegaly.  Musculoskeletal:     Cervical back: Neck supple.     Right lower leg: Edema present.     Left lower leg: Edema present.  Skin:    General: Skin is warm.     Capillary Refill: Capillary refill takes less than 2 seconds.  Neurological:     General: No focal deficit present.     Mental Status: He is alert.     ED Results / Procedures / Treatments   Labs (all  labs ordered are listed, but only abnormal results are displayed) Labs Reviewed  COMPREHENSIVE METABOLIC PANEL - Abnormal; Notable for the following components:      Result Value   Glucose, Bld 121 (*)    Calcium 8.8 (*)    All other components within normal limits  LIPID PANEL - Abnormal; Notable for the following components:   HDL 40 (*)    LDL Cholesterol 134 (*)    All other components within normal limits  RESPIRATORY PANEL BY RT PCR (FLU A&B, COVID)  CBC WITH DIFFERENTIAL/PLATELET  PROTIME-INR  APTT  BRAIN NATRIURETIC PEPTIDE  POC SARS CORONAVIRUS 2 AG -  ED  TROPONIN I (HIGH SENSITIVITY)    EKG EKG Interpretation  Date/Time:  Wednesday August 28 2019 17:58:35 EST Ventricular Rate:  73 PR Interval:  144 QRS Duration: 92 QT  Interval:  376 QTC Calculation: 414 R Axis:     Text Interpretation: Normal sinus rhythm Minimal voltage criteria for LVH, may be normal variant ( R in aVL ) ST & T wave abnormality, consider inferolateral ischemia Abnormal ECG subtle anterior ST elevation, lateral t wave inversion. Confirmed by Charlesetta Shanks [phone]) on 08/28/2019 6:05:30 PM   Radiology DG Chest Port 1 View  Result Date: 08/28/2019 CLINICAL DATA:  58 year old male with shortness of breath and fatigue. EXAM: PORTABLE CHEST 1 VIEW COMPARISON:  Chest radiograph dated 04/20/2018. FINDINGS: The lungs are clear. There is no pleural effusion or pneumothorax. The cardiac silhouette is within normal limits. No acute osseous pathology. IMPRESSION: No active disease. Electronically Signed   By: Anner Crete M.D.   On: 08/28/2019 19:11    Procedures Procedures (including critical care time)  Medications Ordered in ED Medications  0.9 %  sodium chloride infusion (has no administration in time range)  aspirin chewable tablet 324 mg (324 mg Oral Given 08/28/19 1848)  heparin injection 4,000 Units (4,000 Units Intravenous Given 08/28/19 1910)    ED Course  I have reviewed the triage vital signs and the nursing notes.  Pertinent labs & imaging results that were available during my care of the patient were reviewed by me and considered in my medical decision making (see chart for details).    MDM Rules/Calculators/A&P                      Patient presents with fatigue shortness of breath and some chest tightness with leg swelling.  Has been going over the last week or 2.  States at work he has to take breaks because he gets more short of breath.  Has a loud systolic murmur.  Reviewing records has a history of aortic stenosis.  Also 2 years ago no murmur on cardiology exam.  Has EKG changes with T wave changes and some nonspecific anterior ST changes.  Discussed with cardiology who will see patient.  Has had some fatigue also and  states there are many people with Covid at his job.  Will get Covid test.  Will require admission to hospital for but pending the test on whether cardiology or internal medicine.  Cards will admit Final Clinical Impression(s) / ED Diagnoses Final diagnoses:  Dyspnea, unspecified type  Aortic valve stenosis, etiology of cardiac valve disease unspecified    Rx / DC Orders ED Discharge Orders    None       Davonna Belling, MD 08/28/19 2203

## 2019-08-28 NOTE — ED Notes (Signed)
Report given to Emily, RN on 3E  

## 2019-08-28 NOTE — ED Notes (Signed)
ED TO INPATIENT HANDOFF REPORT  ED Nurse Name and Phone #: Lorrin Goodell [phone]  S Name/Age/Gender Johnathan Richmond 58 y.o. male Room/Bed: 287G/811X  Code Status   Code Status: Not on file  Home/SNF/Other Home Patient oriented to: self, place, time and situation Is this baseline? Yes   Triage Complete: Triage complete  Chief Complaint Chest pain [R07.9]  Triage Note Pt reports 1 week of generalized fatigue, chest tightness, sob and leg swelling for the past week. Pt a.o resp e.u at this time. EKG showing t wave inversions    Allergies No Known Allergies  Level of Care/Admitting Diagnosis ED Disposition    ED Disposition Condition Comment   Admit  Hospital Area: [facility] [[id number]]  Level of Care: Telemetry Cardiac [[id number]]  Covid Evaluation: Asymptomatic Screening Protocol (No Symptoms)  Diagnosis: Chest pain [726203]  Admitting Physician: Donato Heinz [[id number]]  Attending Physician: Donato Heinz [[id number]]       B Medical/Surgery History History reviewed. No pertinent past medical history. Past Surgical History:  Procedure Laterality Date  . CHOLECYSTECTOMY       A IV Location/Drains/Wounds Patient Lines/Drains/Airways Status   Active Line/Drains/Airways    Name:   Placement date:   Placement time:   Site:   Days:   Peripheral IV 08/28/19 Right Antecubital   08/28/19    1910    Antecubital   less than 1          Intake/Output Last 24 hours No intake or output data in the 24 hours ending 08/28/19 2025  Labs/Imaging Results for orders placed or performed during the hospital encounter of 08/28/19 (from the past 48 hour(s))  CBC with Differential/Platelet     Status: None   Collection Time: 08/28/19  6:09 PM  Result Value Ref Range   WBC 5.7 4.0 - 10.5 K/uL   RBC 5.30 4.22 - 5.81 MIL/uL   Hemoglobin 14.6 13.0 - 17.0 g/dL   HCT 45.2 39.0 - 52.0 %   MCV 85.3 80.0 - 100.0 fL   MCH 27.5 26.0 - 34.0 pg   MCHC 32.3 30.0  - 36.0 g/dL   RDW 14.3 11.5 - 15.5 %   Platelets 234 150 - 400 K/uL   nRBC 0.0 0.0 - 0.2 %   Neutrophils Relative % 60 %   Neutro Abs 3.4 1.7 - 7.7 K/uL   Lymphocytes Relative 30 %   Lymphs Abs 1.7 0.7 - 4.0 K/uL   Monocytes Relative 8 %   Monocytes Absolute 0.5 0.1 - 1.0 K/uL   Eosinophils Relative 1 %   Eosinophils Absolute 0.1 0.0 - 0.5 K/uL   Basophils Relative 1 %   Basophils Absolute 0.0 0.0 - 0.1 K/uL   Immature Granulocytes 0 %   Abs Immature Granulocytes 0.01 0.00 - 0.07 K/uL    Comment: Performed at [facility], [address] [place], [place] [address]  Protime-INR     Status: None   Collection Time: 08/28/19  6:09 PM  Result Value Ref Range   Prothrombin Time 12.9 11.4 - 15.2 seconds   INR 1.0 0.8 - 1.2    Comment: (NOTE) INR goal varies based on device and disease states. Performed at [facility], [place] [address], [place], [place] [address]   APTT     Status: None   Collection Time: 08/28/19  6:09 PM  Result Value Ref Range   aPTT 31 24 - 36 seconds    Comment: Performed at  [facility], [place] [address], [place], [place] [address]  Comprehensive metabolic panel     Status: Abnormal   Collection Time: 08/28/19  6:09 PM  Result Value Ref Range   Sodium 140 135 - 145 mmol/L   Potassium 4.1 3.5 - 5.1 mmol/L   Chloride 107 98 - 111 mmol/L   CO2 25 22 - 32 mmol/L   Glucose, Bld 121 (H) 70 - 99 mg/dL   BUN 8 6 - 20 mg/dL   Creatinine, Ser 0.92 0.61 - 1.24 mg/dL   Calcium 8.8 (L) 8.9 - 10.3 mg/dL   Total Protein 7.1 6.5 - 8.1 g/dL   Albumin 3.5 3.5 - 5.0 g/dL   AST 16 15 - 41 U/L   ALT 20 0 - 44 U/L   Alkaline Phosphatase 79 38 - 126 U/L   Total Bilirubin 0.8 0.3 - 1.2 mg/dL   GFR calc non Af Amer >60 >60 mL/min   GFR calc Af Amer >60 >60 mL/min   Anion gap 8 5 - 15    Comment: Performed at [facility] [address], [place], [place] [address]  Troponin I (High Sensitivity)     Status: None   Collection Time: 08/28/19  6:09 PM   Result Value Ref Range   Troponin I (High Sensitivity) 6 <18 ng/L    Comment: (NOTE) Elevated high sensitivity troponin I (hsTnI) values and significant  changes across serial measurements may suggest ACS but many other  chronic and acute conditions are known to elevate hsTnI results.  Refer to the "Links" section for chest pain algorithms and additional  guidance. Performed at [facility], [place] [address], [place], [place] [address]   Lipid panel     Status: Abnormal   Collection Time: 08/28/19  6:09 PM  Result Value Ref Range   Cholesterol 193 0 - 200 mg/dL   Triglycerides 97 <150 mg/dL   HDL 40 (L) >40 mg/dL   Total CHOL/HDL Ratio 4.8 RATIO   VLDL 19 0 - 40 mg/dL   LDL Cholesterol 134 (H) 0 - 99 mg/dL    Comment:        Total Cholesterol/HDL:CHD Risk Coronary Heart Disease Risk Table                     Men   Women  1/2 Average Risk   3.4   3.3  Average Risk       5.0   4.4  2 X Average Risk   9.6   7.1  3 X Average Risk  23.4   11.0        Use the calculated Patient Ratio above and the CHD Risk Table to determine the patient's CHD Risk.        ATP III CLASSIFICATION (LDL):  <100     mg/dL   Optimal  100-129  mg/dL   Near or Above                    Optimal  130-159  mg/dL   Borderline  160-189  mg/dL   High  >190     mg/dL   Very High Performed at [facility] [address], [place], [place] [address]   POC SARS Coronavirus 2 Ag-ED - Nasal Swab (BD Veritor Kit)     Status: None   Collection Time: 08/28/19  7:33 PM  Result Value Ref Range   SARS Coronavirus 2 Ag  NEGATIVE NEGATIVE    Comment: (NOTE) SARS-CoV-2 antigen NOT DETECTED.  Negative results are presumptive.  Negative results do not preclude SARS-CoV-2 infection and should not be used as the sole basis for treatment or other patient management decisions, including infection  control decisions, particularly in the presence of clinical signs and  symptoms consistent with COVID-19, or in  those who have been in contact with the virus.  Negative results must be combined with clinical observations, patient history, and epidemiological information. The expected result is Negative. Fact Sheet for Patients: PodPark.tn Fact Sheet for Healthcare Providers: GiftContent.is This test is not yet approved or cleared by the [place] FDA and  has been authorized for detection and/or diagnosis of SARS-CoV-2 by FDA under an Emergency Use Authorization (EUA).  This EUA will remain in effect (meaning this test can be used) for the duration of  the COVID-19 de claration under Section 564(b)(1) of the Act, 21 U.S.C. section 360bbb-3(b)(1), unless the authorization is terminated or revoked sooner.    DG Chest Port 1 View  Result Date: 08/28/2019 CLINICAL DATA:  58 year old male with shortness of breath and fatigue. EXAM: PORTABLE CHEST 1 VIEW COMPARISON:  Chest radiograph dated 04/20/2018. FINDINGS: The lungs are clear. There is no pleural effusion or pneumothorax. The cardiac silhouette is within normal limits. No acute osseous pathology. IMPRESSION: No active disease. Electronically Signed   By: Anner Crete M.D.   On: 08/28/2019 19:11    Pending Labs Unresulted Labs (From admission, onward)    Start     Ordered   08/28/19 2017  HIV Antibody (routine testing w rflx)  (HIV Antibody (Routine testing w reflex) panel)  Once,   STAT     08/28/19 2016   08/28/19 2017  Hemoglobin A1c  Once,   STAT     08/28/19 2016   08/28/19 2017  TSH  Once,   STAT     08/28/19 2016   08/28/19 2017  T4, free  Once,   STAT     08/28/19 2016   08/28/19 1919  Brain natriuretic peptide  Add-on,   AD     08/28/19 1919   08/28/19 1808  Respiratory Panel by RT PCR (Flu A&B, Covid) - Nasopharyngeal Swab  (Tier 2 Respiratory Panel by RT PCR (Flu A&B, Covid) (TAT 2 hrs))  Once,   STAT    Question Answer Comment  Is this test for diagnosis or  screening Screening   Symptomatic for COVID-19 as defined by CDC No   Hospitalized for COVID-19 No   Admitted to ICU for COVID-19 No   Previously tested for COVID-19 Yes   Resident in a congregate (group) care setting No   Employed in healthcare setting No      08/28/19 1807   Signed and Held  CBC  (enoxaparin (LOVENOX)    CrCl >/= 30 ml/min)  Once,   R    Comments: Baseline for enoxaparin therapy IF NOT ALREADY DRAWN.  Notify MD if PLT < 100 K.    Signed and Held   Signed and Held  Creatinine, serum  (enoxaparin (LOVENOX)    CrCl >/= 30 ml/min)  Once,   R    Comments: Baseline for enoxaparin therapy IF NOT ALREADY DRAWN.    Signed and Held   Signed and Held  Creatinine, serum  (enoxaparin (LOVENOX)    CrCl >/= 30 ml/min)  Weekly,   R    Comments: while on enoxaparin therapy    Signed and Held  Signed and Held  CBC  Tomorrow morning,   R     Signed and Held   Signed and Held  Basic metabolic panel  Tomorrow morning,   R     Signed and Held          Vitals/Pain Today's Vitals   08/28/19 1803 08/28/19 1809  BP: (!) 141/78   Pulse: 69   Resp: 16   Temp: 98.1 F (36.7 C)   TempSrc: Oral   SpO2: 100%   PainSc:  7     Isolation Precautions Airborne and Contact precautions  Medications Medications  0.9 %  sodium chloride infusion (has no administration in time range)  aspirin chewable tablet 324 mg (324 mg Oral Given 08/28/19 1848)  heparin injection 4,000 Units (4,000 Units Intravenous Given 08/28/19 1910)    Mobility walks Low fall risk   Focused Assessments Cardiac Assessment Handoff:  Cardiac Rhythm: Normal sinus rhythm Lab Results  Component Value Date   CKTOTAL 111 04/20/2018   Lab Results  Component Value Date   DDIMER 0.51 (H) 04/20/2018   Does the Patient currently have chest pain? No     R Recommendations: See Admitting Provider Note  Report given to:   Additional Notes:

## 2019-08-29 ENCOUNTER — Observation Stay (HOSPITAL_BASED_OUTPATIENT_CLINIC_OR_DEPARTMENT_OTHER): Payer: No Typology Code available for payment source

## 2019-08-29 ENCOUNTER — Encounter (HOSPITAL_COMMUNITY): Payer: Self-pay | Admitting: Cardiology

## 2019-08-29 ENCOUNTER — Observation Stay (HOSPITAL_COMMUNITY): Payer: No Typology Code available for payment source

## 2019-08-29 DIAGNOSIS — E039 Hypothyroidism, unspecified: Secondary | ICD-10-CM

## 2019-08-29 DIAGNOSIS — I10 Essential (primary) hypertension: Secondary | ICD-10-CM

## 2019-08-29 DIAGNOSIS — E038 Other specified hypothyroidism: Secondary | ICD-10-CM | POA: Insufficient documentation

## 2019-08-29 DIAGNOSIS — R600 Localized edema: Secondary | ICD-10-CM

## 2019-08-29 DIAGNOSIS — R0789 Other chest pain: Secondary | ICD-10-CM

## 2019-08-29 DIAGNOSIS — I350 Nonrheumatic aortic (valve) stenosis: Secondary | ICD-10-CM

## 2019-08-29 DIAGNOSIS — Z8679 Personal history of other diseases of the circulatory system: Secondary | ICD-10-CM

## 2019-08-29 DIAGNOSIS — E119 Type 2 diabetes mellitus without complications: Secondary | ICD-10-CM

## 2019-08-29 DIAGNOSIS — M7989 Other specified soft tissue disorders: Secondary | ICD-10-CM

## 2019-08-29 LAB — BASIC METABOLIC PANEL
Anion gap: 10 (ref 5–15)
BUN: 11 mg/dL (ref 6–20)
CO2: 25 mmol/L (ref 22–32)
Calcium: 8.5 mg/dL — ABNORMAL LOW (ref 8.9–10.3)
Chloride: 105 mmol/L (ref 98–111)
Creatinine, Ser: 0.92 mg/dL (ref 0.61–1.24)
GFR calc Af Amer: >60 mL/min (ref >60)
GFR calc non Af Amer: >60 mL/min (ref >60)
Glucose, Bld: 115 mg/dL — ABNORMAL HIGH (ref 70–99)
Potassium: 3.9 mmol/L (ref 3.5–5.1)
Sodium: 140 mmol/L (ref 135–145)

## 2019-08-29 LAB — GLUCOSE, CAPILLARY: Glucose-Capillary: 112 mg/dL — ABNORMAL HIGH (ref 70–99)

## 2019-08-29 LAB — CBC
HCT: 41 % (ref 39.0–52.0)
Hemoglobin: 13.4 g/dL (ref 13.0–17.0)
MCH: 27.5 pg (ref 26.0–34.0)
MCHC: 32.7 g/dL (ref 30.0–36.0)
MCV: 84.2 fL (ref 80.0–100.0)
Platelets: 220 10*3/uL (ref 150–400)
RBC: 4.87 MIL/uL (ref 4.22–5.81)
RDW: 14.1 % (ref 11.5–15.5)
WBC: 5.3 10*3/uL (ref 4.0–10.5)
nRBC: 0 % (ref 0.0–0.2)

## 2019-08-29 LAB — ECHOCARDIOGRAM COMPLETE
Height: 74 in
Weight: 5048 oz

## 2019-08-29 LAB — SARS CORONAVIRUS 2 (TAT 6-24 HRS): SARS Coronavirus 2: NEGATIVE

## 2019-08-29 MED ORDER — DILTIAZEM HCL ER COATED BEADS 120 MG PO CP24: 120.0000 mg | ORAL_CAPSULE | Freq: Every day | ORAL | 6 refills | Status: DC

## 2019-08-29 MED ORDER — INSULIN ASPART 100 UNIT/ML ~~LOC~~ SOLN: 0.0000 [IU] | Freq: Every day | SUBCUTANEOUS | Status: DC

## 2019-08-29 MED ORDER — METFORMIN HCL 500 MG PO TABS: 500.0000 mg | ORAL_TABLET | Freq: Every day | ORAL | 1 refills | Status: DC

## 2019-08-29 MED ORDER — INSULIN ASPART 100 UNIT/ML ~~LOC~~ SOLN: 0.0000 [IU] | Freq: Three times a day (TID) | SUBCUTANEOUS | Status: DC

## 2019-08-29 MED ORDER — IOHEXOL 350 MG/ML SOLN
100.0000 mL | Freq: Once | INTRAVENOUS | Status: AC | PRN
  Administered 2019-08-29: 100 mL via INTRAVENOUS

## 2019-08-29 MED ORDER — DILTIAZEM HCL ER COATED BEADS 120 MG PO CP24
120.0000 mg | ORAL_CAPSULE | Freq: Every day | ORAL | Status: DC
  Administered 2019-08-29: 120 mg via ORAL
  Filled 2019-08-29: qty 1

## 2019-08-29 MED ORDER — APIXABAN 5 MG PO TABS: 5.0000 mg | ORAL_TABLET | Freq: Two times a day (BID) | ORAL | Status: DC

## 2019-08-29 MED ORDER — PANTOPRAZOLE SODIUM 40 MG PO TBEC: 40.0000 mg | DELAYED_RELEASE_TABLET | Freq: Every day | ORAL | 1 refills | Status: DC

## 2019-08-29 MED ORDER — LIVING WELL WITH DIABETES BOOK
Freq: Once | Status: AC
  Filled 2019-08-29: qty 1

## 2019-08-29 MED ORDER — APIXABAN 5 MG PO TABS: 5.0000 mg | ORAL_TABLET | Freq: Two times a day (BID) | ORAL | 6 refills | Status: DC

## 2019-08-29 MED FILL — metFORMIN HCL 500 MG TABS: 500 | 30 days supply | Qty: 30 | Fill #0

## 2019-08-29 MED FILL — PANTOPRAZOLE SOD DR 40 MG T: 40 | 30 days supply | Qty: 30 | Fill #0

## 2019-08-29 MED FILL — DILTIAZEM 24HR ER 120 MG CA: 120 | 30 days supply | Qty: 30 | Fill #0

## 2019-08-29 MED FILL — ELIQUIS 5 MG TABLET: 5 | 30 days supply | Qty: 60 | Fill #0

## 2019-08-29 NOTE — Discharge Summary (Addendum)
Discharge Summary    Patient ID: Johnathan Richmond MRN: [id number]; DOB: [birth year]  Admit date: 08/28/2019 Discharge date: 08/29/2019  Primary Care Provider: Patient, No Pcp Per  Primary Cardiologist: Donato Heinz, MD this admission (likely temporary as patient wishes to f/u in [place]); previously seen by the atrial fib clinic Roderic Palau Primary Electrophysiologist:  None   Discharge Diagnoses    Principal Problem:   Atypical chest pain Active Problems:   Aortic stenosis   History of atrial flutter   Subclinical hypothyroidism   Lower extremity edema   Essential hypertension   Type 2 diabetes mellitus without complication, without long-term current use of insulin (HCC)   Diagnostic Studies/Procedures    2D Echo 08/29/19 IMPRESSIONS 1. Left ventricular ejection fraction, by visual estimation, is 60 to 65%. The left ventricle has normal function. There is mildly increased left ventricular hypertrophy.  2. The left ventricle has no regional wall motion abnormalities.  3. Global right ventricle has normal systolic function.The right ventricular size is normal. No increase in right ventricular wall thickness.  4. Left atrial size was normal.  5. Right atrial size was normal.  6. The mitral valve is normal in structure. Trivial mitral valve regurgitation. No evidence of mitral stenosis.  7. The tricuspid valve is normal in structure.  8. The aortic valve is grossly normal. Aortic valve regurgitation is not visualized. Moderate to severe aortic valve stenosis.  9. The pulmonic valve was normal in structure. Pulmonic valve regurgitation is not visualized. 10. Normal pulmonary artery systolic pressure. 11. The inferior vena cava is normal in size with greater than 50% respiratory variability, suggesting right atrial pressure of 3 mmHg. _____________   History of Present Illness     Johnathan Richmond is a 58 y.o. male with paroxysmal atrial flutter, aortic  stenosis, and hypothyroidism who presented to [facility] with lightheadedness, chest tightness, and lower extremity edema.  To recap history, he was diagnosed with atrial flutter after presented to the ED in 2018.  He followed up in the atrial fibrillation clinic and was started on Cardizem and Xarelto. Given CHADS-VASc 1 (hypertension), he was told to only take Xarelto for 30 days and then could stop. TTE in 06/2017 showed EF 60 to 65%, moderate LVH, grade 2 diastolic dysfunction, moderate aortic stenosis. He was also found to have hypothyroidism and started on medication. He reports that he has had no medical follow-up over the last few years and is no longer on any medications.  He was in his usual state of health until about 1 week ago when he started having tightness in his chest. It had continued approximately a week. He did not notice anything that makes it better or worse.  In addition he also swelling in both legs that has been progressive. He also began feeling lightheaded, which is what prompted him to come to the ED. He was in NSR with normal vital signs. Cardiology was asked to admit due to potential concern for chest pain and previous aortic stenosis. He works at [employer].  Hospital Course     1. Chest pain: This was felt atypical in description with continuous substernal tightness occurring for 1 week.  EKG showed diffuse T wave inversions. His troponins remained negative. Given continuous chest pain with negative troponins, do not suspect ischemic etiology. CTA showed no evidence of PE or aortic dissection. It did show moderate calcifications noted at the aortic valve and few scattered subpleural nodules and perifissural nodules, likely benign  lymph nodes. No follow-up needed if patient is low-risk (and has no known or suspected primary neoplasm). Non-contrast chest CT can be considered in 12 months if patient is high-risk. Patient was informed of this finding and asked to  follow up with primary care, which he will be establishing with primary care. He was started on empiric PPI with improvement. 2D echo as noted in #2. Covid test was negative.  2. Lightheadedness with moderate-severe aortic stenosis: TTE showed EF 60-065%, normal RV, normal PASP, moderate-severe aortic stenosis. Vital signs are stable without any arrhythmias or hypotension. Dr. Bjorn PippinSchumann feels this can be followed as an outpatient. Have discussed with Carlean JewsKatie Thompson with the structural heart team and she will help arrange follow-up. Staff message sent.  3. Lower extremity edema: Mild. BNP normal. LE venous duplex was negative. Suspected dependent edema.  4. H/o Atrial flutter: Single episode reported in 2018. Was started on diltiazem,has not been taking. He was in NSR during this admission. Diltiazem was restarted given history of atrial flutter and likely untreated mild HTN. Of note, he was found to be diabetic this admission so his CHADSVASC is now 2. Therefore, anticoagulation was reinstituted with Eliquis. Risks/benefits reviewed with the patient who was agreeable. He [facility] prior to discharge.  5. Subclinical hypothyroidism: elevated TSH (7.9) but normal free T4 (0.77). Controversial to treat subclincal hypothyroidism, recommend repeating TSH/free T4 as outpatient, can follow-up with PCP to consider thyroid replacement  6. Hypertension: BP mildly elevated during admission, not on any medications currently.  Will restart diltiazem, which he was supposed to be taking for his atrial flutter.  7. T2DM: new diagnosis, A1c 6.7  Diabetes educator consulted. He will be started on metformin, first dose deferred to 12/27 given CT scan this admission. Instructions given for checking blood sugar. Given rx for glucometer, test strips, lancets.   The patient would like to f/u in [place] so this was arranged for 09/09/19 with Jacolyn ReedyMichele Lenze, PA. He also plans to establish with  a PCP in [place] (offices closed today so could not physically make him an appointment yet). Dr. Bjorn PippinSchumann has seen and examined the patient today and feels he is stable for discharge. The patient reported his job is not very strenuous so Dr. Bjorn PippinSchumann felt he could go back to work - per discussion with the patient, he was given work note to return on 09/01/19.  Did the patient have an acute coronary syndrome (MI, NSTEMI, STEMI, etc) this admission?:  No                               Did the patient have a percutaneous coronary intervention (stent / angioplasty)?:  No.   _____________  Discharge Vitals Blood pressure 127/75, pulse 76, temperature 97.8 F (36.6 C), temperature source Oral, resp. rate 20, height 6\' 2"  (1.88 m), weight (!) 143.1 kg, SpO2 95 %.  Filed Weights   08/28/19 2053  Weight: (!) 143.1 kg    Labs & Radiologic Studies    CBC Recent Labs    08/28/19 1809 08/29/19 0432  WBC 5.7 5.3  NEUTROABS 3.4  --   HGB 14.6 13.4  HCT 45.2 41.0  MCV 85.3 84.2  PLT 234 220   Basic Metabolic Panel Recent Labs    09/81/1912/23/20 1809 08/29/19 0432  NA 140 140  K 4.1 3.9  CL 107 105  CO2 25 25  GLUCOSE 121* 115*  BUN 8 11  CREATININE 0.92 0.92  CALCIUM 8.8* 8.5*   Liver Function Tests Recent Labs    08/28/19 1809  AST 16  ALT 20  ALKPHOS 79  BILITOT 0.8  PROT 7.1  ALBUMIN 3.5   High Sensitivity Troponin:   Recent Labs  Lab 08/28/19 1809 08/28/19 2025  TROPONINIHS 6 6    Hemoglobin A1C Recent Labs    08/28/19 2025  HGBA1C 6.7*   Fasting Lipid Panel Recent Labs    08/28/19 1809  CHOL 193  HDL 40*  LDLCALC 134*  TRIG 97  CHOLHDL 4.8   Thyroid Function Tests Recent Labs    08/28/19 2025  TSH 7.935*   _____________  CT ANGIO CHEST PE W OR WO CONTRAST  Result Date: 08/29/2019 CLINICAL DATA:  Shortness of breath and chest pain. EXAM: CT ANGIOGRAPHY CHEST WITH CONTRAST TECHNIQUE: Multidetector CT imaging of the chest was performed using the  standard protocol during bolus administration of intravenous contrast. Multiplanar CT image reconstructions and MIPs were obtained to evaluate the vascular anatomy. CONTRAST:  OMNIPAQUE IOHEXOL 350 MG/ML SOLN COMPARISON:  None. FINDINGS: Cardiovascular: The heart is upper limits of normal in size. No pericardial effusion. The aorta is normal in caliber. Moderate calcifications are noted at the aortic valve. No focal aortic aneurysm or aortic dissection. No aortic or coronary artery calcifications are identified. The pulmonary arterial tree is well opacified. No filling defects to suggest pulmonary embolism. Mediastinum/Nodes: No mediastinal or hilar mass or adenopathy. The esophagus is grossly normal. Lungs/Pleura: No acute pulmonary findings. No infiltrates or effusions. No worrisome pulmonary lesions. There are few small peripheral nodular densities and perifissural nodules which are likely lymph nodes. Upper Abdomen: No significant upper abdominal findings. Musculoskeletal: No chest wall mass, supraclavicular or axillary adenopathy. The thyroid gland appears normal. The bony thorax is intact. Review of the MIP images confirms the above findings. IMPRESSION: 1. Normal caliber thoracic aorta and no findings for dissection or atherosclerotic calcifications. 2. Moderate calcifications are noted at the aortic valve which could lead to aortic stenosis. Cardiology consult may be reasonable. 3. No definite coronary artery calcifications. 4. No CT findings for pulmonary embolism. 5. No acute pulmonary findings. 6. A few scattered subpleural nodules and perifissural nodules, likely benign lymph nodes. No follow-up needed if patient is low-risk (and has no known or suspected primary neoplasm). Non-contrast chest CT can be considered in 12 months if patient is high-risk. This recommendation follows the consensus statement: Guidelines for Management of Incidental Pulmonary Nodules Detected on CT Images: From the  Fleischner Society 2017; Radiology 2017; 284:228-243. Electronically Signed   By: Rudie Meyer M.D.   On: 08/29/2019 07:52   DG Chest Port 1 View  Result Date: 08/28/2019 CLINICAL DATA:  58 year old male with shortness of breath and fatigue. EXAM: PORTABLE CHEST 1 VIEW COMPARISON:  Chest radiograph dated 04/20/2018. FINDINGS: The lungs are clear. There is no pleural effusion or pneumothorax. The cardiac silhouette is within normal limits. No acute osseous pathology. IMPRESSION: No active disease. Electronically Signed   By: Elgie Collard M.D.   On: 08/28/2019 19:11   ECHOCARDIOGRAM COMPLETE  Result Date: 08/29/2019   ECHOCARDIOGRAM REPORT   Patient Name:   Johnathan Richmond Date of Exam: 08/29/2019 Medical Rec #:  [id number]        Height:       74.0 in Accession #:    [id number]       Weight:       315.5 lb Date  of Birth:  05/29/1961       BSA:          2.64 m Patient Age:    58 years         BP:           126/66 mmHg Patient Gender: M                HR:           78 bpm. Exam Location:  Inpatient Procedure: 2D Echo, Cardiac Doppler and Color Doppler Indications:    Aortic stenosis 424.1  History:        Patient has prior history of Echocardiogram examinations, most                 recent 06/19/2017. Signs/Symptoms:Chest Pain; Risk                 Factors:Non-Smoker.  Sonographer:    Tonia Ghent RDCS Referring Phys: [id number] DAYNA N DUNN IMPRESSIONS  1. Left ventricular ejection fraction, by visual estimation, is 60 to 65%. The left ventricle has normal function. There is mildly increased left ventricular hypertrophy.  2. The left ventricle has no regional wall motion abnormalities.  3. Global right ventricle has normal systolic function.The right ventricular size is normal. No increase in right ventricular wall thickness.  4. Left atrial size was normal.  5. Right atrial size was normal.  6. The mitral valve is normal in structure. Trivial mitral valve regurgitation. No evidence of mitral stenosis.   7. The tricuspid valve is normal in structure.  8. The aortic valve is grossly normal. Aortic valve regurgitation is not visualized. Moderate to severe aortic valve stenosis.  9. The pulmonic valve was normal in structure. Pulmonic valve regurgitation is not visualized. 10. Normal pulmonary artery systolic pressure. 11. The inferior vena cava is normal in size with greater than 50% respiratory variability, suggesting right atrial pressure of 3 mmHg. FINDINGS  Left Ventricle: Left ventricular ejection fraction, by visual estimation, is 60 to 65%. The left ventricle has normal function. The left ventricle has no regional wall motion abnormalities. The left ventricular internal cavity size was the left ventricle is normal in size. There is mildly increased left ventricular hypertrophy. Concentric left ventricular hypertrophy. Left ventricular diastolic parameters were normal. Normal left atrial pressure. Right Ventricle: The right ventricular size is normal. No increase in right ventricular wall thickness. Global RV systolic function is has normal systolic function. The tricuspid regurgitant velocity is 2.35 m/s, and with an assumed right atrial pressure  of 3 mmHg, the estimated right ventricular systolic pressure is normal at 25.1 mmHg. Left Atrium: Left atrial size was normal in size. Right Atrium: Right atrial size was normal in size Pericardium: There is no evidence of pericardial effusion. Mitral Valve: The mitral valve is normal in structure. Trivial mitral valve regurgitation. No evidence of mitral valve stenosis by observation. Tricuspid Valve: The tricuspid valve is normal in structure. Tricuspid valve regurgitation is not demonstrated. Aortic Valve: The aortic valve is grossly normal.. There is moderate thickening and moderate calcification of the aortic valve. Aortic valve regurgitation is not visualized. Moderate to severe aortic stenosis is present. There is moderate thickening of the aortic valve. There  is moderate calcification of the aortic valve. Aortic valve mean gradient measures 30.3 mmHg. Aortic valve peak gradient measures 59.0 mmHg. Aortic valve area, by VTI measures 1.03 cm. Pulmonic Valve: The pulmonic valve was normal in structure. Pulmonic valve regurgitation is not visualized. Pulmonic  regurgitation is not visualized. Aorta: The aortic root, ascending aorta and aortic arch are all structurally normal, with no evidence of dilitation or obstruction. Venous: The inferior vena cava is normal in size with greater than 50% respiratory variability, suggesting right atrial pressure of 3 mmHg. IAS/Shunts: No atrial level shunt detected by color flow Doppler. There is no evidence of a patent foramen ovale. No ventricular septal defect is seen or detected. There is no evidence of an atrial septal defect.  LEFT VENTRICLE PLAX 2D LVIDd:         4.91 cm       Diastology LVIDs:         3.94 cm       LV e' lateral:   8.93 cm/s LV PW:         1.28 cm       LV E/e' lateral: 7.7 LV IVS:        1.30 cm       LV e' medial:    5.66 cm/s LVOT diam:     2.34 cm       LV E/e' medial:  12.1 LV SV:         46 ml LV SV Index:   16.42 LVOT Area:     4.30 cm  LV Volumes (MOD) LV area d, A2C:    31.80 cm LV area d, A4C:    38.50 cm LV area s, A2C:    16.50 cm LV area s, A4C:    20.40 cm LV major d, A2C:   7.73 cm LV major d, A4C:   8.64 cm LV major s, A2C:   6.19 cm LV major s, A4C:   6.97 cm LV vol d, MOD A2C: 111.0 ml LV vol d, MOD A4C: 139.0 ml LV vol s, MOD A2C: 39.0 ml LV vol s, MOD A4C: 51.1 ml LV SV MOD A2C:     72.0 ml LV SV MOD A4C:     139.0 ml LV SV MOD BP:      83.5 ml RIGHT VENTRICLE RV S prime:     11.70 cm/s TAPSE (M-mode): 2.1 cm LEFT ATRIUM             Index       RIGHT ATRIUM           Index LA diam:        3.90 cm 1.48 cm/m  RA Area:     15.00 cm LA Vol (A2C):   46.7 ml 17.70 ml/m RA Volume:   37.10 ml  14.06 ml/m LA Vol (A4C):   43.8 ml 16.60 ml/m LA Biplane Vol: 48.0 ml 18.19 ml/m  AORTIC VALVE AV  Area (Vmax):    0.88 cm AV Area (Vmean):   0.96 cm AV Area (VTI):     1.03 cm AV Vmax:           384.14 cm/s AV Vmean:          252.881 cm/s AV VTI:            0.797 m AV Peak Grad:      59.0 mmHg AV Mean Grad:      30.3 mmHg LVOT Vmax:         78.30 cm/s LVOT Vmean:        56.200 cm/s LVOT VTI:          0.191 m LVOT/AV VTI ratio: 0.24  AORTA Ao Root diam: 3.10 cm MITRAL VALVE  TRICUSPID VALVE MV Area (PHT): 2.80 cm             TR Peak grad:   22.1 mmHg MV PHT:        78.59 msec           TR Vmax:        235.00 cm/s MV Decel Time: 271 msec MV E velocity: 68.60 cm/s 103 cm/s  SHUNTS MV A velocity: 58.30 cm/s 70.3 cm/s Systemic VTI:  0.19 m MV E/A ratio:  1.18       1.5       Systemic Diam: 2.34 cm  Rachelle Hora Croitoru MD Electronically signed by Thurmon Fair MD Signature Date/Time: 08/29/2019/1:45:10 PM    Final    VAS [place] LOWER EXTREMITY VENOUS (DVT)  Result Date: 08/29/2019  Lower Venous Study Indications: Swelling.  Comparison Study: No priors. Performing Technologist: Marilynne Halsted RDMS, RVT  Examination Guidelines: A complete evaluation includes B-mode imaging, spectral Doppler, color Doppler, and power Doppler as needed of all accessible portions of each vessel. Bilateral testing is considered an integral part of a complete examination. Limited examinations for reoccurring indications may be performed as noted.  +---------+---------------+---------+-----------+----------+--------------+ RIGHT    CompressibilityPhasicitySpontaneityPropertiesThrombus Aging +---------+---------------+---------+-----------+----------+--------------+ CFV      Full           Yes      Yes                                 +---------+---------------+---------+-----------+----------+--------------+ SFJ      Full                                                        +---------+---------------+---------+-----------+----------+--------------+ FV Prox  Full                                                         +---------+---------------+---------+-----------+----------+--------------+ FV Mid   Full                                                        +---------+---------------+---------+-----------+----------+--------------+ FV DistalFull                                                        +---------+---------------+---------+-----------+----------+--------------+ PFV      Full                                                        +---------+---------------+---------+-----------+----------+--------------+ POP      Full           Yes      Yes                                 +---------+---------------+---------+-----------+----------+--------------+  PTV      Full                                                        +---------+---------------+---------+-----------+----------+--------------+ PERO     Full                                                        +---------+---------------+---------+-----------+----------+--------------+   +---------+---------------+---------+-----------+----------+--------------+ LEFT     CompressibilityPhasicitySpontaneityPropertiesThrombus Aging +---------+---------------+---------+-----------+----------+--------------+ CFV      Full           Yes      Yes                                 +---------+---------------+---------+-----------+----------+--------------+ SFJ      Full                                                        +---------+---------------+---------+-----------+----------+--------------+ FV Prox  Full                                                        +---------+---------------+---------+-----------+----------+--------------+ FV Mid   Full                                                        +---------+---------------+---------+-----------+----------+--------------+ FV DistalFull                                                         +---------+---------------+---------+-----------+----------+--------------+ PFV      Full                                                        +---------+---------------+---------+-----------+----------+--------------+ POP      Full           Yes      Yes                                 +---------+---------------+---------+-----------+----------+--------------+ PTV      Full                                                        +---------+---------------+---------+-----------+----------+--------------+  PERO     Full                                                        +---------+---------------+---------+-----------+----------+--------------+     Summary: Right: There is no evidence of deep vein thrombosis in the lower extremity. No cystic structure found in the popliteal fossa. Left: There is no evidence of deep vein thrombosis in the lower extremity. No cystic structure found in the popliteal fossa.  *See table(s) above for measurements and observations.    Preliminary    Disposition   Pt is being discharged home today in good condition.  Follow-up Plans & Appointments    Follow-up Information    Dyann Kief, PA-C Follow up.   Specialty: Cardiology Why: [facility] - located in [facility] - see appointment information below on 09/09/19. Elon Jester is one of the PAs that works with our cardiology team. The structural heart team will call you to arrange follow-up for your narrow heart valve. Contact information: [address] [place] [address] [phone]          Discharge Instructions    Amb Referral to Nutrition and Diabetic E   Complete by: As directed    Diet - low sodium heart healthy   Complete by: As directed    Discharge instructions   Complete by: As directed    You were started on a blood thinner called Eliquis (apixaban) to protect against stroke. If you notice any bleeding such as blood in stool, black tarry stools, blood in  urine, nosebleeds or any other unusual bleeding, call your doctor immediately. It is not normal to have this kind of bleeding while on a blood thinner and usually indicates there is an underlying problem with one of your body systems that needs to be checked out.   Your diltiazem was restarted.  You were started on a medicine called metformin for your blood sugar. Do not start taking this until 09/01/19 since you had a CT scan today. Please begin checking your blood sugar 4 times a day to start - before each meal and at bedtime. Keep a log and bring this to your primary care doctor's appointment. You can also bring it to your cardiology appointment we as well.  You were started on a medicine called pantoprazole in case your chest pain was from acid reflux.   Increase activity slowly   Complete by: As directed       Discharge Medications   Allergies as of 08/29/2019   No Known Allergies     Medication List    TAKE these medications   apixaban 5 MG Tabs tablet Commonly known as: ELIQUIS Take 1 tablet (5 mg total) by mouth 2 (two) times daily.   diltiazem 120 MG 24 hr capsule Commonly known as: CARDIZEM CD Take 1 capsule (120 mg total) by mouth daily. Start taking on: August 30, 2019   metFORMIN 500 MG tablet Commonly known as: Glucophage Take 1 tablet (500 mg total) by mouth daily with breakfast. Do not start until 09/01/19. Start taking on: September 01, 2019   pantoprazole 40 MG tablet Commonly known as: PROTONIX Take 1 tablet (40 mg total) by mouth daily.          Outstanding Labs/Studies   N/a  Duration of Discharge  Encounter   Greater than 30 minutes including physician time.  Signed, Laurann Montana, PA-C 08/29/2019, 4:09 PM

## 2019-08-29 NOTE — Progress Notes (Signed)
Progress Note  Patient Name: Johnathan Richmond Date of Encounter: 08/29/2019  Primary Cardiologist: No primary care provider on file.   Subjective   Troponins negative x2.  CTPA shows no evidence of PE or aortic dissection; no acute pulmonary findings, no coronary calcifications, does have AV calcifications.  Reports chest pain improved today, continues to have lightheadedness.  Inpatient Medications    Scheduled Meds: . enoxaparin (LOVENOX) injection  40 mg Subcutaneous Q24H  . pantoprazole  40 mg Oral Q0600  . sodium chloride flush  3 mL Intravenous Q12H   Continuous Infusions: . sodium chloride    . sodium chloride     PRN Meds: sodium chloride, acetaminophen, ondansetron (ZOFRAN) IV, sodium chloride flush   Vital Signs    Vitals:   08/28/19 1930 08/28/19 2000 08/28/19 2053 08/29/19 0400  BP: (!) 143/91 139/88 131/90 126/66  Pulse: 66 63 73 75  Resp: 12 19 20 16   Temp:    98.2 F (36.8 C)  TempSrc:      SpO2: 98% 98%  98%  Weight:   (!) 143.1 kg   Height:   6\' 2"  (1.88 m)     Intake/Output Summary (Last 24 hours) at 08/29/2019 0733 Last data filed at 08/29/2019 0400 Gross per 24 hour  Intake 240 ml  Output --  Net 240 ml   Last 3 Weights 08/28/2019 04/20/2018 05/26/2017  Weight (lbs) 315 lb 8 oz 304 lb 305 lb 12.8 oz  Weight (kg) 143.11 kg 137.893 kg 138.71 kg      Telemetry    Sinus rhythm in 60s - Personally Reviewed  ECG    No new ECG - Personally Reviewed  Physical Exam   GEN: No acute distress.   Neck: No JVD Cardiac: RRR, 3/6 systolic murmur loudest at RUSB Respiratory: Clear to auscultation bilaterally. GI: Soft, nontender, non-distended  MS: Trace edema; No deformity. Neuro:  Nonfocal  Psych: Normal affect   Labs    High Sensitivity Troponin:   Recent Labs  Lab 08/28/19 1809 08/28/19 2025  TROPONINIHS 6 6      Chemistry Recent Labs  Lab 08/28/19 1809 08/29/19 0432  NA 140 140  K 4.1 3.9  CL 107 105  CO2 25 25   GLUCOSE 121* 115*  BUN 8 11  CREATININE 0.92 0.92  CALCIUM 8.8* 8.5*  PROT 7.1  --   ALBUMIN 3.5  --   AST 16  --   ALT 20  --   ALKPHOS 79  --   BILITOT 0.8  --   GFRNONAA >60 >60  GFRAA >60 >60  ANIONGAP 8 10     Hematology Recent Labs  Lab 08/28/19 1809 08/29/19 0432  WBC 5.7 5.3  RBC 5.30 4.87  HGB 14.6 13.4  HCT 45.2 41.0  MCV 85.3 84.2  MCH 27.5 27.5  MCHC 32.3 32.7  RDW 14.3 14.1  PLT 234 220    BNP Recent Labs  Lab 08/28/19 1944  BNP 51.0     DDimer No results for input(s): DDIMER in the last 168 hours.   Radiology    DG Chest Port 1 View  Result Date: 08/28/2019 CLINICAL DATA:  58 year old male with shortness of breath and fatigue. EXAM: PORTABLE CHEST 1 VIEW COMPARISON:  Chest radiograph dated 04/20/2018. FINDINGS: The lungs are clear. There is no pleural effusion or pneumothorax. The cardiac silhouette is within normal limits. No acute osseous pathology. IMPRESSION: No active disease. Electronically Signed   By: Laren Everts.D.  On: 08/28/2019 19:11    Cardiac Studies   TTE 06/19/17: - Left ventricle: The cavity size was normal. Wall thickness was increased in a pattern of moderate LVH. Systolic function was normal. The estimated ejection fraction was in the range of 60% to 65%. Wall motion was normal; there were no regional wall motion abnormalities. Features are consistent with a pseudonormal left ventricular filling pattern, with concomitant abnormal relaxation and increased filling pressure (grade 2 diastolic dysfunction). - Aortic valve: Moderately calcified annulus. There was moderate stenosis. There was trivial regurgitation. Valve area (VTI): 1 cm^2. Valve area (Vmax): 0.91 cm^2. Valve area (Vmean): 0.93 cm^2. - Left atrium: The atrium was moderately dilated. - Right atrium: The atrium was moderately dilated.  CTPA 08/29/19: 1. Normal caliber thoracic aorta and no findings for dissection  or atherosclerotic calcifications. 2. Moderate calcifications are noted at the aortic valve which could lead to aortic stenosis. Cardiology consult may be reasonable. 3. No definite coronary artery calcifications. 4. No CT findings for pulmonary embolism. 5. No acute pulmonary findings. 6. A few scattered subpleural nodules and perifissural nodules, likely benign lymph nodes. No follow-up needed if patient is low-risk (and has no known or suspected primary neoplasm). Non-contrast chest CT can be considered in 12 months if patient is high-risk. This recommendation follows the consensus statement: Guidelines for Management of Incidental Pulmonary Nodules Detected on CT Images: From the Fleischner Society 2017; Radiology 2017; 284:228-243.  Patient Profile     58 y.o. male with atrial flutter, aortic stenosis, hypothyroidism who presents with lightheadedness, chest tightness, and lower extremity edema.  Assessment & Plan    Chest pain: Atypical in description, has describes continuous substernal tightness occurring for 1 week.  EKG with diffuse T wave inversions.  Given continuous chest pain with negative troponins, do not suspect ischemic etiology.  CTA shows no evidence of PE or AD.  Start empiric PPI.  Lightheadedness: Had moderate aortic stenosis on last TTE in 2018.  Will check TTE to evaluate for progression  Lower extremity edema: Mild edema.  BNP normal.  Will check lower extremity duplex  Atrial flutter: Single episode reported in 2018.  Was started on diltiazem, has not been taking -Will restart diltiazem given atrial flutter and likely untreated hypertension  Subclinical hypothyroidism: elevated TSH (7.9) but normal free T4 (0.77).  Controversial to treat subclincal hypothyroidism, recommend repeating TSH/free T4 as outpatient, can follow-up with PCP to consider thyroid replacement  Hypertension: BP mildly elevated during admission, not on any medications currently.  Will  restart diltiazem, which he was supposed to be taking for his atrial flutter  T2DM: new diagnosis, A1c 6.7  Consult diabetes educator.  Will start on metformin on discharge.   For questions or updates, please contact [facility] Please consult [email or web address] for contact info under        Signed, Little Ishikawa, MD  08/29/2019, 7:33 AM

## 2019-08-29 NOTE — Progress Notes (Signed)
Venous duplex lower ext  has been completed. Refer to [facility] under chart review to view preliminary results.   08/29/2019  10:02 AM Elenor Quinones, Bonnye Fava

## 2019-08-29 NOTE — Discharge Instructions (Addendum)
Fingerstick glucose (sugar) goals for home: Before meals: 80-130 mg/dl 2-Hours after meals: less than 180 mg/dl Hemoglobin Y4I goal: 7% or less  Check your blood sugars before meals and at bedtime and keep a log book to take to all PCP appointments  Blood Glucose Monitoring, Adult Monitoring your blood sugar (glucose) is an important part of managing your diabetes (diabetes mellitus). Blood glucose monitoring involves checking your blood glucose as often as directed and keeping a record (log) of your results over time. Checking your blood glucose regularly and keeping a blood glucose log can:  Help you and your health care provider adjust your diabetes management plan as needed, including your medicines or insulin.  Help you understand how food, exercise, illnesses, and medicines affect your blood glucose.  Let you know what your blood glucose is at any time. You can quickly find out if you have low blood glucose (hypoglycemia) or high blood glucose (hyperglycemia). Your health care provider will set individualized treatment goals for you. Your goals will be based on your age, other medical conditions you have, and how you respond to diabetes treatment. Generally, the goal of treatment is to maintain the following blood glucose levels:  Before meals (preprandial): 80-130 mg/dL (3.4-7.4 mmol/L).  After meals (postprandial): below 180 mg/dL (10 mmol/L).  A1c level: less than 7%. Supplies needed:  Blood glucose meter.  Test strips for your meter. Each meter has its own strips. You must use the strips that came with your meter.  A needle to prick your finger (lancet). Do not use a lancet more than one time.  A device that holds the lancet (lancing device).  A journal or log book to write down your results. How to check your blood glucose   1. Wash your hands with soap and water. 2. Prick the side of your finger (not the tip) with the lancet. Use a different finger each  time. 3. Gently rub the finger until a small drop of blood appears. 4. Follow instructions that come with your meter for inserting the test strip, applying blood to the strip, and using your blood glucose meter. 5. Write down your result and any notes. Some meters allow you to use areas of your body other than your finger (alternative sites) to test your blood. The most common alternative sites are:  Forearm.  Thigh.  Palm of the hand. If you think you may have hypoglycemia, or if you have a history of not knowing when your blood glucose is getting low (hypoglycemia unawareness), do not use alternative sites. Use your finger instead. Alternative sites may not be as accurate as the fingers, because blood flow is slower in these areas. This means that the result you get may be delayed, and it may be different from the result that you would get from your finger. Follow these instructions at home: Blood glucose log    Every time you check your blood glucose, write down your result. Also write down any notes about things that may be affecting your blood glucose, such as your diet and exercise for the day. This information can help you and your health care provider: ? Look for patterns in your blood glucose over time. ? Adjust your diabetes management plan as needed.  Check if your meter allows you to download your records to a computer. Most glucose meters store a record of glucose readings in the meter. If you have type 1 diabetes:  Check your blood glucose 2 or more times a  day.  Also check your blood glucose: ? Before every insulin injection. ? Before and after exercise. ? Before meals. ? 2 hours after a meal. ? Occasionally between 2:00 a.m. and 3:00 a.m., as directed. ? Before potentially dangerous tasks, like driving or using heavy machinery. ? At bedtime.  You may need to check your blood glucose more often, up to 6-10 times a day, if you: ? Use an insulin pump. ? Need  multiple daily injections (MDI). ? Have diabetes that is not well-controlled. ? Are ill. ? Have a history of severe hypoglycemia. ? Have hypoglycemia unawareness. If you have type 2 diabetes:  If you take insulin or other diabetes medicines, check your blood glucose 2 or more times a day.  If you are on intensive insulin therapy, check your blood glucose 4 or more times a day. Occasionally, you may also need to check between 2:00 a.m. and 3:00 a.m., as directed.  Also check your blood glucose: ? Before and after exercise. ? Before potentially dangerous tasks, like driving or using heavy machinery.  You may need to check your blood glucose more often if: ? Your medicine is being adjusted. ? Your diabetes is not well-controlled. ? You are ill. General tips  Always keep your supplies with you.  If you have questions or need help, all blood glucose meters have a 24-hour "hotline" phone number that you can call. You may also contact your health care provider.  After you use a few boxes of test strips, adjust (calibrate) your blood glucose meter by following instructions that came with your meter. Contact a health care provider if:  Your blood glucose is at or above 240 mg/dL (16.113.3 mmol/L) for 2 days in a row.  You have been sick or have had a fever for 2 days or longer, and you are not getting better.  You have any of the following problems for more than 6 hours: ? You cannot eat or drink. ? You have nausea or vomiting. ? You have diarrhea. Get help right away if:  Your blood glucose is lower than 54 mg/dL (3 mmol/L).  You become confused or you have trouble thinking clearly.  You have difficulty breathing.  You have moderate or large ketone levels in your urine. Summary  Monitoring your blood sugar (glucose) is an important part of managing your diabetes (diabetes mellitus).  Blood glucose monitoring involves checking your blood glucose as often as directed and keeping a  record (log) of your results over time.  Your health care provider will set individualized treatment goals for you. Your goals will be based on your age, other medical conditions you have, and how you respond to diabetes treatment.  Every time you check your blood glucose, write down your result. Also write down any notes about things that may be affecting your blood glucose, such as your diet and exercise for the day. This information is not intended to replace advice given to you by your health care provider. Make sure you discuss any questions you have with your health care provider. Document Released: 08/25/2003 Document Revised: 06/15/2018 Document Reviewed: 02/01/2016 Elsevier Patient Education  2020 Elsevier Inc.      Carbohydrate Counting For People With Diabetes  Why Is Carbohydrate Counting Important? . Counting carbohydrate servings may help you control your blood glucose level so that you feel better.  . The balance between the carbohydrates you eat and insulin determines what your blood glucose level will be after eating.  . Carbohydrate  counting can also help you plan your meals. Which Foods Have Carbohydrates? Foods with carbohydrates include: . Breads, crackers, and cereals  . Pasta, rice, and grains  . Starchy vegetables, such as potatoes, corn, and peas  . Beans and legumes  . Milk, soy milk, and yogurt  . Fruits and fruit juices  . Sweets, such as cakes, cookies, ice cream, jam, and jelly Carbohydrate Servings In diabetes meal planning, 1 serving of a food with carbohydrate has about 15 grams of carbohydrate: . Check serving sizes with measuring cups and spoons or a food scale.  . Read the Nutrition Facts on food labels to find out how many grams of carbohydrate are in foods you eat. The food lists in this handout show portions that have about 15 grams of carbohydrate.  Tips Meal Planning Tips . An Eating Plan tells you how many carbohydrate servings to eat at  your meals and snacks. For many adults, eating 3 to 5 servings of carbohydrate foods at each meal and 1 or 2 carbohydrate servings for each snack works well.  . In a healthy daily Eating Plan, most carbohydrates come from:  . At least 6 servings of fruits and nonstarchy vegetables  . At least 6 servings of grains, beans, and starchy vegetables, with at least 3 servings from whole grains  . At least 2 servings of milk or milk products . Check your blood glucose level regularly. It can tell you if you need to adjust when you eat carbohydrates.  . Eating foods that have fiber, such as whole grains, and having very few salty foods is good for your health.  . Eat 4 to 6 ounces of meat or other protein foods (such as soybean burgers) each day. Choose low-fat sources of protein, such as lean beef, lean pork, chicken, fish, low-fat cheese, or vegetarian foods such as soy.  . Eat some healthy fats, such as olive oil, canola oil, and nuts.  . Eat very little saturated fats. These unhealthy fats are found in butter, cream, and high-fat meats, such as bacon and sausage.  . Eat very little or no trans fats. These unhealthy fats are found in all foods that list "partially hydrogenated oil" as an ingredient.  Label Reading Tips The Nutrition Facts panel on a label lists the grams of total carbohydrate in 1 standard serving. The label's standard serving may be larger or smaller than 1 carbohydrate serving. To figure out how many carbohydrate servings are in the food: . First, look at the label's standard serving size.  Marland Kitchen Check the grams of total carbohydrate. This is the amount of carbohydrate in 1 standard serving.  . Divide the grams of total carbohydrate by 15. This number equals the number of carbohydrate servings in 1 standard serving. Remember: 1 carbohydrate serving is 15 grams of carbohydrate.  . Note: You may ignore the grams of sugars on the Nutrition Facts panel because they are included in the grams of  total carbohydrate.  Foods Recommended 1 serving = about 15 grams of carbohydrate Starches . 1 slice bread (1 ounce)  . 1 tortilla (6-inch size)  .  large bagel (1 ounce)  . 2 taco shells (5-inch size)  .  hamburger or hot dog bun ( ounce)  .  cup ready-to-eat unsweetened cereal  .  cup cooked cereal  . 1 cup broth-based soup  . 4 to 6 small crackers  . 1/3 cup pasta or rice (cooked)  .  cup beans, peas, corn,  sweet potatoes, winter squash, or mashed or boiled potatoes (cooked)  .  large baked potato (3 ounces)  .  ounce pretzels, potato chips, or tortilla chips  . 3 cups popcorn (popped) Fruit . 1 small fresh fruit ( to 1 cup)  .  cup canned or frozen fruit  . 2 tablespoons dried fruit (blueberries, cherries, cranberries, mixed fruit, raisins)  . 17 small grapes (3 ounces)  . 1 cup melon or berries  .  cup unsweetened fruit juice Milk . 1 cup fat-free or reduced-fat milk  . 1 cup soy milk  . 2/3 cup (6 ounces) nonfat yogurt sweetened with sugar-free sweetener Sweets and Desserts . 2-inch square cake (unfrosted)  . 2 small cookies (2/3 ounce)  .  cup ice cream or frozen yogurt  .  cup sherbet or sorbet  . 1 tablespoon syrup, jam, jelly, table sugar, or honey  . 2 tablespoons light syrup Other Foods . Count 1 cup raw vegetables or  cup cooked nonstarchy vegetables as zero (0) carbohydrate servings or "free" foods. If you eat 3 or more servings at one meal, count them as 1 carbohydrate serving.  . Foods that have less than 20 calories in each serving also may be counted as zero carbohydrate servings or "free" foods.  . Count 1 cup of casserole or other mixed foods as 2 carbohydrate servings.  Carbohydrate Counting for People with Diabetes Sample 1-Day Menu  Breakfast 1 extra-small banana (1 carbohydrate serving)  1 cup low-fat or fat-free milk (1 carbohydrate serving)  1 slice whole wheat bread (1 carbohydrate serving)  1 teaspoon margarine  Lunch 2 ounces  [place] slices  2 slices whole wheat bread (2 carbohydrate servings)  2 lettuce leaves  4 celery sticks  4 carrot sticks  1 medium apple (1 carbohydrate serving)  1 cup low-fat or fat-free milk (1 carbohydrate serving)  Afternoon Snack 2 tablespoons raisins (1 carbohydrate serving)  3/4 ounce unsalted mini pretzels (1 carbohydrate serving)  Evening Meal 3 ounces lean roast beef  1/2 large baked potato (2 carbohydrate servings)  1 tablespoon reduced-fat sour cream  1/2 cup green beans  1 tablespoon light salad dressing  1 whole wheat dinner roll (1 carbohydrate serving)  1 teaspoon margarine  1 cup melon balls (1 carbohydrate serving)  Evening Snack 2 tablespoons unsalted nuts   Carbohydrate Counting for People with Diabetes Vegan Sample 1-Day Menu  Breakfast 1 cup cooked oatmeal (2 carbohydrate servings)   cup blueberries (1 carbohydrate serving)  2 tablespoons flaxseeds  1 cup soymilk fortified with calcium and vitamin D  1 cup coffee  Lunch 2 slices whole wheat bread (2 carbohydrate servings)   cup baked tofu   cup lettuce  2 slices tomato  2 slices avocado   cup baby carrots  1 orange (1 carbohydrate serving)  1 cup soymilk fortified with calcium and vitamin D   Evening Meal Burrito made with: 1 6-inch corn tortilla (1 carbohydrate serving)  1 cup refried vegetarian beans (1 carbohydrate serving)   cup chopped tomatoes   cup lettuce   cup salsa  1/3 cup brown rice (1 carbohydrate serving)  1 tablespoon olive oil for rice   cup zucchini   Evening Snack 6 small whole grain crackers (1 carbohydrate serving)  2 apricots ( carbohydrate serving)   cup unsalted peanuts ( carbohydrate serving)     Carbohydrate Counting for People with Diabetes Vegetarian (Lacto-Ovo) Sample 1-Day Menu  Breakfast 1 cup cooked oatmeal (2 carbohydrate servings)  cup blueberries (1 carbohydrate serving)  2 tablespoons flaxseeds  1 egg  1 cup 1% milk (1 carbohydrate serving)  1  cup coffee  Lunch 2 slices whole wheat bread (2 carbohydrate servings)  2 ounces low-fat cheese   cup lettuce  2 slices tomato  2 slices avocado   cup baby carrots  1 orange (1 carbohydrate serving)  1 cup unsweetened tea  Evening Meal Burrito made with: 1 6-inch corn tortilla (1 carbohydrate serving)   cup refried vegetarian beans (1 carbohydrate serving)   cup tomatoes   cup lettuce   cup salsa  1/3 cup brown rice (1 carbohydrate serving)  1 tablespoon olive oil for rice   cup zucchini  1 cup 1% milk (1 carbohydrate serving)  Evening Snack 6 small whole grain crackers (1 carbohydrate serving)  2 apricots ( carbohydrate serving)   cup unsalted peanuts ( carbohydrate serving)    Copyright 2020  Academy of Nutrition and Dietetics. All rights reserved.  Using Nutrition Labels: Carbohydrate  . Serving Size  . Look at the serving size. All the information on the label is based on this portion. Jolyne Loa Per Container  . The number of servings contained in the package. . Guidelines for Carbohydrate  . Look at the total grams of carbohydrate in the serving size.  . 1 carbohydrate choice = 15 grams of carbohydrate. Range of Carbohydrate Grams Per Choice  Carbohydrate Grams/Choice Carbohydrate Choices  6-10   11-20 1  21-25 1  26-35 2  36-40 2  41-50 3  51-55 3  56-65 4  66-70 4  71-80 5    Copyright 2020  Academy of Nutrition and Dietetics. All rights reserved.

## 2019-08-29 NOTE — Plan of Care (Signed)
  RD consulted for nutrition education regarding diabetes.    Lab Results  Component Value Date   HGBA1C 6.7 (H) 08/28/2019   PTA DM medications are none.   Labs reviewed: CBGS: 112 (inpatient orders for glycemic control are none).  Per MD notes, plan to d/c home on Metformin.   Spoke with pt at bedside, who was pleasant and in good spirits today. He reports that MD told him he had pre-diabetes. RD explained Hgb A1c results and diagnostic criteria for DM. Also explained plan for home metformin.   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 Per tp he "eats everything; all day, everyday". Pt also reports consuming large amounts of regular soda. He was very engaged and willing to incorporate RD recommendations.   Focus of education on was on decreasing sugar sweetened beverages/ low calorie beverage options, consistent eating pattern, adapting more healthful foods choices, and importance of DM self-management to prevent further complications. Pt does not have a PCP, but is working on locating one. Informed pt about outpatient diabetes educations services and DM coordinator evaluation. Outpatient referral ordered by RD.   RD provided "Carbohydrate Counting for People with Diabetes" handout from the Academy of Nutrition and Dietetics. Discussed different food groups and their effects on blood sugar, emphasizing carbohydrate-containing foods. Provided list of carbohydrates and recommended serving sizes of common foods.  Discussed importance of controlled and consistent carbohydrate intake throughout the day. Provided examples of ways to balance meals/snacks and encouraged intake of high-fiber, whole grain complex carbohydrates. Teach back method used.  Expect fair to good compliance.  Body mass index is 40.51 kg/m. Pt meets criteria for extreme obesity, class III based on current BMI.  Current diet order is Heart Healthy, patient is consuming approximately 100% of  meals at this time. Labs and medications reviewed. No further nutrition interventions warranted at this time. RD contact information provided. If additional nutrition issues arise, please re-consult RD.  Kalynne Womac A. Jimmye Norman, RD, LDN, [place] Registered Dietitian II Certified Diabetes Care and Education Specialist Pager: [phone] After hours Pager: [phone]

## 2019-08-29 NOTE — Progress Notes (Addendum)
Inpatient Diabetes Program Recommendations  AACE/ADA: New Consensus Statement on Inpatient Glycemic Control (2015)  Target Ranges:  Prepandial:   less than 140 mg/dL      Peak postprandial:   less than 180 mg/dL (1-2 hours)      Critically ill patients:  140 - 180 mg/dL   Results for LUNDON, VERDEJO (MRN [id number]) as of 08/29/2019 09:37  Ref. Range 08/29/2019 04:32  Glucose Latest Ref Range: 70 - 99 mg/dL 115 (H)   Results for MASAKI, ROTHBAUER (MRN [id number]) as of 08/29/2019 09:37  Ref. Range 08/28/2019 20:25  Hemoglobin A1C Latest Ref Range: 4.8 - 5.6 % 6.7 (H)  (145 mg/dl)    Admit with: CP/ LE Edema  NO previous history of Diabetes  Current Insulin Orders: None placed yet    Per Dr. Newman Nickels notes today, new diagnosis of Diabetes.  Plan to d/c home on Metformin.  Diabetes Coordinator not on campus today, however, will order educational materials for patient and call pt by phone to discuss new diabetes diagnosis and plan to d/c home on Metformin.     MD- Please consider placing orders for CBG checks   May want to add Novolog Sensitive Correction Scale/ SSI (0-9 units) TID AC + HS    Addendum 12:40pm- Spoke with pt about new diagnosis.  Discussed A1C results with him and explained what an A1C is, basic pathophysiology of DM Type 2, basic home care, basic diabetes diet nutrition principles, importance of checking CBGs and maintaining good CBG control to prevent long-term and short-term complications.  Also reviewed blood sugar goals and A1c goals for home.    Also discussed with pt the plan for him to start taking Metformin at home to control CBGs.  Explained what Metformin is, how it works, when to take, side effects, etc.  RNs to provide ongoing basic DM education at bedside with this patient.  Have ordered educational booklet and RD consult for DM diet education for this patient.       --Will follow patient during hospitalization--  Wyn Quaker RN, MSN, CDE Diabetes Coordinator Inpatient Glycemic Control Team Team Pager: [phone] (8a-5p)

## 2019-08-29 NOTE — TOC Initial Note (Signed)
Transition of Care [facility] - Initial/Assessment Note    Patient Details  Name: Johnathan Richmond MRN: [id number] Date of Birth: Oct 06, 1960  Transition of Care [facility] CM/SW Contact:    Marilu Favre, RN Phone Number: 08/29/2019, 2:40 PM  Clinical Narrative:                  Patient from home. Provided 30 day free Eliquis card and $10 co pay card.   Patient needs PCP. Explained he can call number on insurance card and be provided a complete list of providers in network. However, today being a holiday offices are closed. Patient voiced understanding. He has seen Dr Legrand Rams in past and plans to make a appointment .    Expected Discharge Plan: Home/Self Care Barriers to Discharge: Continued Medical Work up   Patient Goals and CMS Choice Patient states their goals for this hospitalization and ongoing recovery are:: to return to home CMS Medicare.gov Compare Post Acute Care list provided to:: Patient Choice offered to / list presented to : NA  Expected Discharge Plan and Services Expected Discharge Plan: Home/Self Care   Discharge Planning Services: CM Consult, Other - See comment(PCP and Eliquis)   Living arrangements for the past 2 months: Single Family Home                           HH Arranged: NA          Prior Living Arrangements/Services Living arrangements for the past 2 months: Single Family Home Lives with:: Self Patient language and need for interpreter reviewed:: Yes        Need for Family Participation in Patient Care: Yes (Comment) Care giver support system in place?: Yes (comment)   Criminal Activity/Legal Involvement Pertinent to Current Situation/Hospitalization: No - Comment as needed  Activities of Daily Living Home Assistive Devices/Equipment: None ADL Screening (condition at time of admission) Patient's cognitive ability adequate to safely complete daily activities?: Yes Is the patient deaf or have difficulty hearing?: No Does the patient  have difficulty seeing, even when wearing glasses/contacts?: No Does the patient have difficulty concentrating, remembering, or making decisions?: No Patient able to express need for assistance with ADLs?: No Does the patient have difficulty dressing or bathing?: No Independently performs ADLs?: Yes (appropriate for developmental age) Does the patient have difficulty walking or climbing stairs?: No Weakness of Legs: None Weakness of Arms/Hands: None  Permission Sought/Granted   Permission granted to share information with : No              Emotional Assessment Appearance:: Appears stated age Attitude/Demeanor/Rapport: Engaged Affect (typically observed): Accepting Orientation: : Oriented to Self, Oriented to Place, Oriented to  Time, Oriented to Situation Alcohol / Substance Use: Not Applicable Psych Involvement: No (comment)  Admission diagnosis:  Chest pain [R07.9] Dyspnea, unspecified type [R06.00] Aortic valve stenosis, etiology of cardiac valve disease unspecified [I35.0] Patient Active Problem List   Diagnosis Date Noted  . Chest pain 08/28/2019   PCP:  Patient, No Pcp Per Pharmacy:   [facility] [phone] - [place], [place] [address] [place] [address] Phone: [phone] Fax: [phone]     Social Determinants of Health (SDOH) Interventions    Readmission Risk Interventions No flowsheet data found.

## 2019-08-29 NOTE — Progress Notes (Signed)
  Echocardiogram 2D Echocardiogram has been performed.  Burnett Kanaris 08/29/2019, 11:22 AM

## 2019-09-02 NOTE — Progress Notes (Signed)
Cardiology Office Note    Date:  09/09/2019   ID:  Johnathan Richmond, DOB Dec 14, 1960, MRN [id number]  PCP:  Patient, No Pcp Per  Cardiologist: Donato Heinz, MD to switch to Dr. Harl Bowie in [place] EPS: None  No chief complaint on file.   History of Present Illness:  Johnathan Richmond is a 58 y.o. male with history of atrial flutter-single episode 2018 , aortic stenosis, hypothyroidism was seen in hospital 08/29/19 with atypical chest pain and lightheadedness.EKG with diffuse TWI, troponins negative, CTA no PE or cAD. Echo LVEF 60-65% mod-severe AS-to f/u with structural heart team. Lower ext edema felt to be dependent. Diltiazem restarted for history of Aflutter and HTN. Also started on eliquis for CHADSVASC 2.  Patient has lost 5 lbs since hospital. Stopped drinking sodas and watching sugars closely. Back to work at [employer].  Needs a PCP.  No significant shortness of breath, chest pain or edema.  Thinks he is taking 5 medications at home but only has 4 listed on his discharge summary and medication sheet here.  Diabetes mellitus-new diagnosis. Needs PCP-will give him a list.  Past Medical History:  Diagnosis Date  . Aortic stenosis   . Diabetes mellitus ([place])   . Essential hypertension   . Hypothyroidism   . Morbid obesity ([place])   . Paroxysmal atrial flutter [facility])     Past Surgical History:  Procedure Laterality Date  . CHOLECYSTECTOMY      Current Medications: Current Meds  Medication Sig  . apixaban (ELIQUIS) 5 MG TABS tablet Take 1 tablet (5 mg total) by mouth 2 (two) times daily.  Marland Kitchen diltiazem (CARDIZEM CD) 120 MG 24 hr capsule Take 1 capsule (120 mg total) by mouth daily.  . metFORMIN (GLUCOPHAGE) 500 MG tablet Take 1 tablet (500 mg total) by mouth daily with breakfast. Do not start until 09/01/19.  Marland Kitchen pantoprazole (PROTONIX) 40 MG tablet Take 1 tablet (40 mg total) by mouth daily.     Allergies:   Patient has no known allergies.    Social History   Socioeconomic History  . Marital status: Divorced    Spouse name: Not on file  . Number of children: Not on file  . Years of education: Not on file  . Highest education level: Not on file  Occupational History  . Not on file  Tobacco Use  . Smoking status: Never Smoker  . Smokeless tobacco: Never Used  Substance and Sexual Activity  . Alcohol use: No  . Drug use: No  . Sexual activity: Not on file  Other Topics Concern  . Not on file  Social History Narrative  . Not on file   Social Determinants of Health   Financial Resource Strain:   . Difficulty of Paying Living Expenses: Not on file  Food Insecurity:   . Worried About Charity fundraiser in the Last Year: Not on file  . Ran Out of Food in the Last Year: Not on file  Transportation Needs:   . Lack of Transportation (Medical): Not on file  . Lack of Transportation (Non-Medical): Not on file  Physical Activity:   . Days of Exercise per Week: Not on file  . Minutes of Exercise per Session: Not on file  Stress:   . Feeling of Stress : Not on file  Social Connections:   . Frequency of Communication with Friends and Family: Not on file  . Frequency of Social Gatherings with Friends and Family: Not  on file  . Attends Religious Services: Not on file  . Active Member of Clubs or Organizations: Not on file  . Attends Archivist Meetings: Not on file  . Marital Status: Not on file     Family History:  The patient's   family history includes COPD in his mother; Epilepsy in his father; Schizophrenia in his mother.   ROS:   Please see the history of present illness.    ROS All other systems reviewed and are negative.   PHYSICAL EXAM:   VS:  BP (!) 148/76   Pulse 83   Temp (!) 97.1 F (36.2 C) (Temporal)   Ht '6\' 2"'$  (1.88 m)   Wt (!) 312 lb (141.5 kg)   SpO2 98%   BMI 40.06 kg/m   Physical Exam  GEN: Well nourished, well developed, in no acute distress  Neck: no JVD, carotid bruits,  or masses Cardiac:RRR; 2/6 systolic murmur the left sternal border Respiratory:  clear to auscultation bilaterally, normal work of breathing GI: soft, nontender, nondistended, + BS Ext: without cyanosis, clubbing, or edema, Good distal pulses bilaterally Neuro:  Alert and Oriented x 3 Psych: euthymic mood, full affect  Wt Readings from Last 3 Encounters:  09/09/19 (!) 312 lb (141.5 kg)  08/28/19 (!) 315 lb 8 oz (143.1 kg)  04/20/18 (!) 304 lb (137.9 kg)      Studies/Labs Reviewed:   EKG:  EKG is not ordered today.    Recent Labs: 08/28/2019: ALT 20; B Natriuretic Peptide 51.0; TSH 7.935 08/29/2019: BUN 11; Creatinine, Ser 0.92; Hemoglobin 13.4; Platelets 220; Potassium 3.9; Sodium 140   Lipid Panel    Component Value Date/Time   CHOL 193 08/28/2019 1809   TRIG 97 08/28/2019 1809   HDL 40 (L) 08/28/2019 1809   CHOLHDL 4.8 08/28/2019 1809   VLDL 19 08/28/2019 1809   LDLCALC 134 (H) 08/28/2019 1809    Additional studies/ records that were reviewed today include:   2D Echo 08/29/19 IMPRESSIONS  1. Left ventricular ejection fraction, by visual estimation, is 60 to 65%. The left ventricle has normal function. There is mildly increased left ventricular hypertrophy.  2. The left ventricle has no regional wall motion abnormalities.  3. Global right ventricle has normal systolic function.The right ventricular size is normal. No increase in right ventricular wall thickness.  4. Left atrial size was normal.  5. Right atrial size was normal.  6. The mitral valve is normal in structure. Trivial mitral valve regurgitation. No evidence of mitral stenosis.  7. The tricuspid valve is normal in structure.  8. The aortic valve is grossly normal. Aortic valve regurgitation is not visualized. Moderate to severe aortic valve stenosis.  9. The pulmonic valve was normal in structure. Pulmonic valve regurgitation is not visualized. 10. Normal pulmonary artery systolic pressure. 11. The inferior  vena cava is normal in size with greater than 50% respiratory variability, suggesting right atrial pressure of 3 mmHg. _____________      ASSESSMENT:    1. Nonrheumatic aortic valve stenosis   2. Essential hypertension   3. History of atrial flutter   4. Lower extremity edema   5. Type 2 diabetes mellitus with complication, without long-term current use of insulin (HCC)      PLAN:  In order of problems listed above:  Moderate-severe Aortic stenosis on echo 08/29/19 has an appointment with Dr. Angelena Form 09/15/2018 for structural heart  HTN blood pressure up a little.  He says he has been eating a  lot of nuts as he has tried to change his diet because of prediabetes.  Instructed him on a 2 g sodium diet.  Also thinks he is taking another medication.  He will call us back and verify.  Paroxysmal A flutter started on eliquis for CHADSVASC 2-no bleeding problems.  Will need follow-up be met and CBC  Lower ext edema felt to be dependent-has improved.    Medication Adjustments/Labs and Tests Ordered: Current medicines are reviewed at length with the patient today.  Concerns regarding medicines are outlined above.  Medication changes, Labs and Tests ordered today are listed in the Patient Instructions below. Patient Instructions  Medication Instructions:  Your physician recommends that you continue on your current medications as directed. Please refer to the Current Medication list given to you today.  *If you need a refill on your cardiac medications before your next appointment, please call your pharmacy*  Lab Work: NONE  If you have labs (blood work) drawn today and your tests are completely normal, you will receive your results only by: Marland Kitchen MyChart Message (if you have MyChart) OR . A paper copy in the mail If you have any lab test that is abnormal or we need to change your treatment, we will call you to review the results.  Testing/Procedures: NONE   Follow-Up: At [facility], you and your health needs are our priority.  As part of our continuing mission to provide you with exceptional heart care, we have created designated Provider Care Teams.  These Care Teams include your primary Cardiologist (physician) and Advanced Practice Providers (APPs -  Physician Assistants and Nurse Practitioners) who all work together to provide you with the care you need, when you need it.  Your next appointment:   3 month(s)  The format for your next appointment:   In Person  Provider:   Carlyle Dolly, MD  Other Instructions Thank you for choosing [facility]!   Low-Sodium Eating Plan Sodium, which is an element that makes up salt, helps you maintain a healthy balance of fluids in your body. Too much sodium can increase your blood pressure and cause fluid and waste to be held in your body. Your health care provider or dietitian may recommend following this plan if you have high blood pressure (hypertension), kidney disease, liver disease, or heart failure. Eating less sodium can help lower your blood pressure, reduce swelling, and protect your heart, liver, and kidneys. What are tips for following this plan? General guidelines  Most people on this plan should limit their sodium intake to 1,500-2,000 mg (milligrams) of sodium each day. Reading food labels   The Nutrition Facts label lists the amount of sodium in one serving of the food. If you eat more than one serving, you must multiply the listed amount of sodium by the number of servings.  Choose foods with less than 140 mg of sodium per serving.  Avoid foods with 300 mg of sodium or more per serving. Shopping  Look for lower-sodium products, often labeled as "low-sodium" or "no salt added."  Always check the sodium content even if foods are labeled as "unsalted" or "no salt added".  Buy fresh foods. ? Avoid canned foods and premade or frozen meals. ? Avoid canned, cured, or processed  meats  Buy breads that have less than 80 mg of sodium per slice. Cooking  Eat more home-cooked food and less restaurant, buffet, and fast food.  Avoid adding salt when cooking. Use salt-free seasonings or herbs  instead of table salt or sea salt. Check with your health care provider or pharmacist before using salt substitutes.  Cook with plant-based oils, such as canola, sunflower, or olive oil. Meal planning  When eating at a restaurant, ask that your food be prepared with less salt or no salt, if possible.  Avoid foods that contain MSG (monosodium glutamate). MSG is sometimes added to [place] food, bouillon, and some canned foods. What foods are recommended? The items listed may not be a complete list. Talk with your dietitian about what dietary choices are best for you. Grains Low-sodium cereals, including oats, puffed wheat and rice, and shredded wheat. Low-sodium crackers. Unsalted rice. Unsalted pasta. Low-sodium bread. Whole-grain breads and whole-grain pasta. Vegetables Fresh or frozen vegetables. "No salt added" canned vegetables. "No salt added" tomato sauce and paste. Low-sodium or reduced-sodium tomato and vegetable juice. Fruits Fresh, frozen, or canned fruit. Fruit juice. Meats and other protein foods Fresh or frozen (no salt added) meat, poultry, seafood, and fish. Low-sodium canned tuna and salmon. Unsalted nuts. Dried peas, beans, and lentils without added salt. Unsalted canned beans. Eggs. Unsalted nut butters. Dairy Milk. Soy milk. Cheese that is naturally low in sodium, such as ricotta cheese, fresh mozzarella, or Swiss cheese Low-sodium or reduced-sodium cheese. Cream cheese. Yogurt. Fats and oils Unsalted butter. Unsalted margarine with no trans fat. Vegetable oils such as canola or olive oils. Seasonings and other foods Fresh and dried herbs and spices. Salt-free seasonings. Low-sodium mustard and ketchup. Sodium-free salad dressing. Sodium-free light mayonnaise.  Fresh or refrigerated horseradish. Lemon juice. Vinegar. Homemade, reduced-sodium, or low-sodium soups. Unsalted popcorn and pretzels. Low-salt or salt-free chips. What foods are not recommended? The items listed may not be a complete list. Talk with your dietitian about what dietary choices are best for you. Grains Instant hot cereals. Bread stuffing, pancake, and biscuit mixes. Croutons. Seasoned rice or pasta mixes. Noodle soup cups. Boxed or frozen macaroni and cheese. Regular salted crackers. Self-rising flour. Vegetables Sauerkraut, pickled vegetables, and relishes. Olives. [place] fries. Onion rings. Regular canned vegetables (not low-sodium or reduced-sodium). Regular canned tomato sauce and paste (not low-sodium or reduced-sodium). Regular tomato and vegetable juice (not low-sodium or reduced-sodium). Frozen vegetables in sauces. Meats and other protein foods Meat or fish that is salted, canned, smoked, spiced, or pickled. Bacon, ham, sausage, hotdogs, corned beef, chipped beef, packaged lunch meats, salt pork, jerky, pickled herring, anchovies, regular canned tuna, sardines, salted nuts. Dairy Processed cheese and cheese spreads. Cheese curds. Blue cheese. Feta cheese. String cheese. Regular cottage cheese. Buttermilk. Canned milk. Fats and oils Salted butter. Regular margarine. Ghee. Bacon fat. Seasonings and other foods Onion salt, garlic salt, seasoned salt, table salt, and sea salt. Canned and packaged gravies. Worcestershire sauce. Tartar sauce. Barbecue sauce. Teriyaki sauce. Soy sauce, including reduced-sodium. Steak sauce. Fish sauce. Oyster sauce. Cocktail sauce. Horseradish that you find on the shelf. Regular ketchup and mustard. Meat flavorings and tenderizers. Bouillon cubes. Hot sauce and Tabasco sauce. Premade or packaged marinades. Premade or packaged taco seasonings. Relishes. Regular salad dressings. Salsa. Potato and tortilla chips. Corn chips and puffs. Salted popcorn and  pretzels. Canned or dried soups. Pizza. Frozen entrees and pot pies. Summary  Eating less sodium can help lower your blood pressure, reduce swelling, and protect your heart, liver, and kidneys.  Most people on this plan should limit their sodium intake to 1,500-2,000 mg (milligrams) of sodium each day.  Canned, boxed, and frozen foods are high in sodium. Restaurant foods, fast foods, and  pizza are also very high in sodium. You also get sodium by adding salt to food.  Try to cook at home, eat more fresh fruits and vegetables, and eat less fast food, canned, processed, or prepared foods. This information is not intended to replace advice given to you by your health care provider. Make sure you discuss any questions you have with your health care provider. Document Revised: 08/04/2017 Document Reviewed: 08/15/2016 Elsevier Patient Education  2020 [place], Ermalinda Barrios, [place]  09/09/2019 2:27 PM    [facility] [place], [place], [place]  [address] Phone: [phone]; Fax: [phone]

## 2019-09-09 ENCOUNTER — Ambulatory Visit (INDEPENDENT_AMBULATORY_CARE_PROVIDER_SITE_OTHER): Payer: No Typology Code available for payment source | Admitting: Physician Assistant

## 2019-09-09 ENCOUNTER — Encounter: Payer: Self-pay | Admitting: Physician Assistant

## 2019-09-09 ENCOUNTER — Other Ambulatory Visit: Payer: Self-pay

## 2019-09-09 VITALS — BP 148/76 | HR 83 | Temp 97.1°F | Ht 74.0 in | Wt 312.0 lb

## 2019-09-09 DIAGNOSIS — Z8679 Personal history of other diseases of the circulatory system: Secondary | ICD-10-CM

## 2019-09-09 DIAGNOSIS — I350 Nonrheumatic aortic (valve) stenosis: Secondary | ICD-10-CM

## 2019-09-09 DIAGNOSIS — I10 Essential (primary) hypertension: Secondary | ICD-10-CM | POA: Diagnosis not present

## 2019-09-09 DIAGNOSIS — R600 Localized edema: Secondary | ICD-10-CM | POA: Diagnosis not present

## 2019-09-09 DIAGNOSIS — E118 Type 2 diabetes mellitus with unspecified complications: Secondary | ICD-10-CM

## 2019-09-09 NOTE — Patient Instructions (Signed)
Medication Instructions:  Your physician recommends that you continue on your current medications as directed. Please refer to the Current Medication list given to you today.  *If you need a refill on your cardiac medications before your next appointment, please call your pharmacy*  Lab Work: NONE  If you have labs (blood work) drawn today and your tests are completely normal, you will receive your results only by: Marland Kitchen MyChart Message (if you have MyChart) OR . A paper copy in the mail If you have any lab test that is abnormal or we need to change your treatment, we will call you to review the results.  Testing/Procedures: NONE   Follow-Up: At [facility], you and your health needs are our priority.  As part of our continuing mission to provide you with exceptional heart care, we have created designated Provider Care Teams.  These Care Teams include your primary Cardiologist (physician) and Advanced Practice Providers (APPs -  Physician Assistants and Nurse Practitioners) who all work together to provide you with the care you need, when you need it.  Your next appointment:   3 month(s)  The format for your next appointment:   In Person  Provider:   Carlyle Dolly, MD  Other Instructions Thank you for choosing [facility]!   Low-Sodium Eating Plan Sodium, which is an element that makes up salt, helps you maintain a healthy balance of fluids in your body. Too much sodium can increase your blood pressure and cause fluid and waste to be held in your body. Your health care provider or dietitian may recommend following this plan if you have high blood pressure (hypertension), kidney disease, liver disease, or heart failure. Eating less sodium can help lower your blood pressure, reduce swelling, and protect your heart, liver, and kidneys. What are tips for following this plan? General guidelines  Most people on this plan should limit their sodium intake to 1,500-2,000 mg  (milligrams) of sodium each day. Reading food labels   The Nutrition Facts label lists the amount of sodium in one serving of the food. If you eat more than one serving, you must multiply the listed amount of sodium by the number of servings.  Choose foods with less than 140 mg of sodium per serving.  Avoid foods with 300 mg of sodium or more per serving. Shopping  Look for lower-sodium products, often labeled as "low-sodium" or "no salt added."  Always check the sodium content even if foods are labeled as "unsalted" or "no salt added".  Buy fresh foods. ? Avoid canned foods and premade or frozen meals. ? Avoid canned, cured, or processed meats  Buy breads that have less than 80 mg of sodium per slice. Cooking  Eat more home-cooked food and less restaurant, buffet, and fast food.  Avoid adding salt when cooking. Use salt-free seasonings or herbs instead of table salt or sea salt. Check with your health care provider or pharmacist before using salt substitutes.  Cook with plant-based oils, such as canola, sunflower, or olive oil. Meal planning  When eating at a restaurant, ask that your food be prepared with less salt or no salt, if possible.  Avoid foods that contain MSG (monosodium glutamate). MSG is sometimes added to [place] food, bouillon, and some canned foods. What foods are recommended? The items listed may not be a complete list. Talk with your dietitian about what dietary choices are best for you. Grains Low-sodium cereals, including oats, puffed wheat and rice, and shredded wheat. Low-sodium crackers.  Unsalted rice. Unsalted pasta. Low-sodium bread. Whole-grain breads and whole-grain pasta. Vegetables Fresh or frozen vegetables. "No salt added" canned vegetables. "No salt added" tomato sauce and paste. Low-sodium or reduced-sodium tomato and vegetable juice. Fruits Fresh, frozen, or canned fruit. Fruit juice. Meats and other protein foods Fresh or frozen (no salt  added) meat, poultry, seafood, and fish. Low-sodium canned tuna and salmon. Unsalted nuts. Dried peas, beans, and lentils without added salt. Unsalted canned beans. Eggs. Unsalted nut butters. Dairy Milk. Soy milk. Cheese that is naturally low in sodium, such as ricotta cheese, fresh mozzarella, or Swiss cheese Low-sodium or reduced-sodium cheese. Cream cheese. Yogurt. Fats and oils Unsalted butter. Unsalted margarine with no trans fat. Vegetable oils such as canola or olive oils. Seasonings and other foods Fresh and dried herbs and spices. Salt-free seasonings. Low-sodium mustard and ketchup. Sodium-free salad dressing. Sodium-free light mayonnaise. Fresh or refrigerated horseradish. Lemon juice. Vinegar. Homemade, reduced-sodium, or low-sodium soups. Unsalted popcorn and pretzels. Low-salt or salt-free chips. What foods are not recommended? The items listed may not be a complete list. Talk with your dietitian about what dietary choices are best for you. Grains Instant hot cereals. Bread stuffing, pancake, and biscuit mixes. Croutons. Seasoned rice or pasta mixes. Noodle soup cups. Boxed or frozen macaroni and cheese. Regular salted crackers. Self-rising flour. Vegetables Sauerkraut, pickled vegetables, and relishes. Olives. [place] fries. Onion rings. Regular canned vegetables (not low-sodium or reduced-sodium). Regular canned tomato sauce and paste (not low-sodium or reduced-sodium). Regular tomato and vegetable juice (not low-sodium or reduced-sodium). Frozen vegetables in sauces. Meats and other protein foods Meat or fish that is salted, canned, smoked, spiced, or pickled. Bacon, ham, sausage, hotdogs, corned beef, chipped beef, packaged lunch meats, salt pork, jerky, pickled herring, anchovies, regular canned tuna, sardines, salted nuts. Dairy Processed cheese and cheese spreads. Cheese curds. Blue cheese. Feta cheese. String cheese. Regular cottage cheese. Buttermilk. Canned milk. Fats and  oils Salted butter. Regular margarine. Ghee. Bacon fat. Seasonings and other foods Onion salt, garlic salt, seasoned salt, table salt, and sea salt. Canned and packaged gravies. Worcestershire sauce. Tartar sauce. Barbecue sauce. Teriyaki sauce. Soy sauce, including reduced-sodium. Steak sauce. Fish sauce. Oyster sauce. Cocktail sauce. Horseradish that you find on the shelf. Regular ketchup and mustard. Meat flavorings and tenderizers. Bouillon cubes. Hot sauce and Tabasco sauce. Premade or packaged marinades. Premade or packaged taco seasonings. Relishes. Regular salad dressings. Salsa. Potato and tortilla chips. Corn chips and puffs. Salted popcorn and pretzels. Canned or dried soups. Pizza. Frozen entrees and pot pies. Summary  Eating less sodium can help lower your blood pressure, reduce swelling, and protect your heart, liver, and kidneys.  Most people on this plan should limit their sodium intake to 1,500-2,000 mg (milligrams) of sodium each day.  Canned, boxed, and frozen foods are high in sodium. Restaurant foods, fast foods, and pizza are also very high in sodium. You also get sodium by adding salt to food.  Try to cook at home, eat more fresh fruits and vegetables, and eat less fast food, canned, processed, or prepared foods. This information is not intended to replace advice given to you by your health care provider. Make sure you discuss any questions you have with your health care provider. Document Revised: 08/04/2017 Document Reviewed: 08/15/2016 Elsevier Patient Education  2020 [employer].

## 2019-09-16 ENCOUNTER — Institutional Professional Consult (permissible substitution): Payer: No Typology Code available for payment source | Admitting: Cardiovascular Disease

## 2019-09-16 NOTE — Progress Notes (Deleted)
Structural Heart Clinic Consult Note  No chief complaint on file.  History of Present Illness: 59 yo male with history of atrial flutter, DM, HTN, hypothyroidism, obesity and moderate aortic stenosis here today to establish in the structural heart clinic. He was admitted to [facility] 08/29/19 with atypical chest pain and dizziness. CTA with no evidence of PE. Echo 08/29/19 with LVEF=60-65%. Moderate to severe aortic stenosis with mean gradient of 30 mmHg, AVA 0.88 cm2, dimensionless index 0.24.  He was started on Diltiazem and Eliquis.   He tells me today that he ***  Primary Care Physician: Patient, No Pcp Per Primary Cardiologist: Bjorn Pippin Referring Cardiologist: Bjorn Pippin  Past Medical History:  Diagnosis Date  . Aortic stenosis   . Diabetes mellitus (HCC)   . Essential hypertension   . Hypothyroidism   . Morbid obesity (HCC)   . Paroxysmal atrial flutter [facility])     Past Surgical History:  Procedure Laterality Date  . CHOLECYSTECTOMY      Current Outpatient Medications  Medication Sig Dispense Refill  . apixaban (ELIQUIS) 5 MG TABS tablet Take 1 tablet (5 mg total) by mouth 2 (two) times daily. 60 tablet 6  . diltiazem (CARDIZEM CD) 120 MG 24 hr capsule Take 1 capsule (120 mg total) by mouth daily. 30 capsule 6  . metFORMIN (GLUCOPHAGE) 500 MG tablet Take 1 tablet (500 mg total) by mouth daily with breakfast. Do not start until 09/01/19. 30 tablet 1  . pantoprazole (PROTONIX) 40 MG tablet Take 1 tablet (40 mg total) by mouth daily. 30 tablet 1   No current facility-administered medications for this visit.    No Known Allergies  Social History   Socioeconomic History  . Marital status: Divorced    Spouse name: Not on file  . Number of children: Not on file  . Years of education: Not on file  . Highest education level: Not on file  Occupational History  . Not on file  Tobacco Use  . Smoking status: Never Smoker  . Smokeless tobacco: Never Used  Substance and Sexual  Activity  . Alcohol use: No  . Drug use: No  . Sexual activity: Not on file  Other Topics Concern  . Not on file  Social History Narrative  . Not on file   Social Determinants of Health   Financial Resource Strain:   . Difficulty of Paying Living Expenses: Not on file  Food Insecurity:   . Worried About Programme researcher, broadcasting/film/video in the Last Year: Not on file  . Ran Out of Food in the Last Year: Not on file  Transportation Needs:   . Lack of Transportation (Medical): Not on file  . Lack of Transportation (Non-Medical): Not on file  Physical Activity:   . Days of Exercise per Week: Not on file  . Minutes of Exercise per Session: Not on file  Stress:   . Feeling of Stress : Not on file  Social Connections:   . Frequency of Communication with Friends and Family: Not on file  . Frequency of Social Gatherings with Friends and Family: Not on file  . Attends Religious Services: Not on file  . Active Member of Clubs or Organizations: Not on file  . Attends Banker Meetings: Not on file  . Marital Status: Not on file  Intimate Partner Violence:   . Fear of Current or Ex-Partner: Not on file  . Emotionally Abused: Not on file  . Physically Abused: Not on file  . Sexually  Abused: Not on file    Family History  Problem Relation Age of Onset  . Schizophrenia Mother   . COPD Mother   . Epilepsy Father     Review of Systems:  As stated in the HPI and otherwise negative.   There were no vitals taken for this visit.  Physical Examination: General: Well developed, well nourished, NAD  HEENT: OP clear, mucus membranes moist  SKIN: warm, dry. No rashes. Neuro: No focal deficits  Musculoskeletal: Muscle strength 5/5 all ext  Psychiatric: Mood and affect normal  Neck: No JVD, no carotid bruits, no thyromegaly, no lymphadenopathy.  Lungs:Clear bilaterally, no wheezes, rhonci, crackles Cardiovascular: Regular rate and rhythm. *** Loud, harsh, late peaking systolic murmur.    Abdomen:Soft. Bowel sounds present. Non-tender.  Extremities: *** No lower extremity edema. Pulses are 2 + in the bilateral DP/PT.  EKG:  EKG {ACTION; IS/IS [id number]} ordered today. The ekg ordered today demonstrates ***  Echo 08/29/19:   1. Left ventricular ejection fraction, by visual estimation, is 60 to 65%. The left ventricle has normal function. There is mildly increased left ventricular hypertrophy.  2. The left ventricle has no regional wall motion abnormalities.  3. Global right ventricle has normal systolic function.The right ventricular size is normal. No increase in right ventricular wall thickness.  4. Left atrial size was normal.  5. Right atrial size was normal.  6. The mitral valve is normal in structure. Trivial mitral valve regurgitation. No evidence of mitral stenosis.  7. The tricuspid valve is normal in structure.  8. The aortic valve is grossly normal. Aortic valve regurgitation is not visualized. Moderate to severe aortic valve stenosis.  9. The pulmonic valve was normal in structure. Pulmonic valve regurgitation is not visualized. 10. Normal pulmonary artery systolic pressure. 11. The inferior vena cava is normal in size with greater than 50% respiratory variability, suggesting right atrial pressure of 3 mmHg.  FINDINGS  Left Ventricle: Left ventricular ejection fraction, by visual estimation, is 60 to 65%. The left ventricle has normal function. The left ventricle has no regional wall motion abnormalities. The left ventricular internal cavity size was the left  ventricle is normal in size. There is mildly increased left ventricular hypertrophy. Concentric left ventricular hypertrophy. Left ventricular diastolic parameters were normal. Normal left atrial pressure.  Right Ventricle: The right ventricular size is normal. No increase in right ventricular wall thickness. Global RV systolic function is has normal systolic function. The tricuspid regurgitant  velocity is 2.35 m/s, and with an assumed right atrial pressure  of 3 mmHg, the estimated right ventricular systolic pressure is normal at 25.1 mmHg.  Left Atrium: Left atrial size was normal in size.  Right Atrium: Right atrial size was normal in size  Pericardium: There is no evidence of pericardial effusion.  Mitral Valve: The mitral valve is normal in structure. Trivial mitral valve regurgitation. No evidence of mitral valve stenosis by observation.  Tricuspid Valve: The tricuspid valve is normal in structure. Tricuspid valve regurgitation is not demonstrated.  Aortic Valve: The aortic valve is grossly normal.. There is moderate thickening and moderate calcification of the aortic valve. Aortic valve regurgitation is not visualized. Moderate to severe aortic stenosis is present. There is moderate thickening of  the aortic valve. There is moderate calcification of the aortic valve. Aortic valve mean gradient measures 30.3 mmHg. Aortic valve peak gradient measures 59.0 mmHg. Aortic valve area, by VTI measures 1.03 cm.  Pulmonic Valve: The pulmonic valve was normal in structure. Pulmonic  valve regurgitation is not visualized. Pulmonic regurgitation is not visualized.  Aorta: The aortic root, ascending aorta and aortic arch are all structurally normal, with no evidence of dilitation or obstruction.  Venous: The inferior vena cava is normal in size with greater than 50% respiratory variability, suggesting right atrial pressure of 3 mmHg.  IAS/Shunts: No atrial level shunt detected by color flow Doppler. There is no evidence of a patent foramen ovale. No ventricular septal defect is seen or detected. There is no evidence of an atrial septal defect.    LEFT VENTRICLE PLAX 2D LVIDd:         4.91 cm       Diastology LVIDs:         3.94 cm       LV e' lateral:   8.93 cm/s LV PW:         1.28 cm       LV E/e' lateral: 7.7 LV IVS:        1.30 cm       LV e' medial:    5.66 cm/s LVOT  diam:     2.34 cm       LV E/e' medial:  12.1 LV SV:         46 ml LV SV Index:   16.42 LVOT Area:     4.30 cm   LV Volumes (MOD) LV area d, A2C:    31.80 cm LV area d, A4C:    38.50 cm LV area s, A2C:    16.50 cm LV area s, A4C:    20.40 cm LV major d, A2C:   7.73 cm LV major d, A4C:   8.64 cm LV major s, A2C:   6.19 cm LV major s, A4C:   6.97 cm LV vol d, MOD A2C: 111.0 ml LV vol d, MOD A4C: 139.0 ml LV vol s, MOD A2C: 39.0 ml LV vol s, MOD A4C: 51.1 ml LV SV MOD A2C:     72.0 ml LV SV MOD A4C:     139.0 ml LV SV MOD BP:      83.5 ml  RIGHT VENTRICLE RV S prime:     11.70 cm/s TAPSE (M-mode): 2.1 cm  LEFT ATRIUM             Index       RIGHT ATRIUM           Index LA diam:        3.90 cm 1.48 cm/m  RA Area:     15.00 cm LA Vol (A2C):   46.7 ml 17.70 ml/m RA Volume:   37.10 ml  14.06 ml/m LA Vol (A4C):   43.8 ml 16.60 ml/m LA Biplane Vol: 48.0 ml 18.19 ml/m  AORTIC VALVE AV Area (Vmax):    0.88 cm AV Area (Vmean):   0.96 cm AV Area (VTI):     1.03 cm AV Vmax:           384.14 cm/s AV Vmean:          252.881 cm/s AV VTI:            0.797 m AV Peak Grad:      59.0 mmHg AV Mean Grad:      30.3 mmHg LVOT Vmax:         78.30 cm/s LVOT Vmean:        56.200 cm/s LVOT VTI:          0.191 m LVOT/AV VTI ratio:  0.24   AORTA Ao Root diam: 3.10 cm  MITRAL VALVE                        TRICUSPID VALVE MV Area (PHT): 2.80 cm             TR Peak grad:   22.1 mmHg MV PHT:        78.59 msec           TR Vmax:        235.00 cm/s MV Decel Time: 271 msec MV E velocity: 68.60 cm/s 103 cm/s  SHUNTS MV A velocity: 58.30 cm/s 70.3 cm/s Systemic VTI:  0.19 m MV E/A ratio:  1.18       1.5       Systemic Diam: 2.34 cm  Recent Labs: 08/28/2019: ALT 20; B Natriuretic Peptide 51.0; TSH 7.935 08/29/2019: BUN 11; Creatinine, Ser 0.92; Hemoglobin 13.4; Platelets 220; Potassium 3.9; Sodium 140   Lipid Panel    Component Value Date/Time   CHOL 193 08/28/2019 1809   TRIG  97 08/28/2019 1809   HDL 40 (L) 08/28/2019 1809   CHOLHDL 4.8 08/28/2019 1809   VLDL 19 08/28/2019 1809   LDLCALC 134 (H) 08/28/2019 1809     Wt Readings from Last 3 Encounters:  09/09/19 (!) 312 lb (141.5 kg)  08/28/19 (!) 315 lb 8 oz (143.1 kg)  04/20/18 (!) 304 lb (137.9 kg)     Other studies Reviewed: Additional studies/ records that were reviewed today include: ***. Review of the above records demonstrates: ***   Assessment and Plan:   1. Severe Aortic Valve Stenosis: He has moderate to severe aortic stenosis. I have personally reviewed the echo images. The aortic valve is thickened, calcified but the leaflets open. No indication at this time to proceed with planning for AVR. Will follow with repeat echo in 6 months. He will call with a change in his clinical status.      Current medicines are reviewed at length with the patient today.  The patient does not have concerns regarding medicines.  The following changes have been made:  no change  Labs/ tests ordered today include:  No orders of the defined types were placed in this encounter.    Disposition:   FU with me in 6 months.    Signed, Verne Carrow, MD 09/16/2019 7:32 AM    [facility] [address] [place], [place], [place]  [address] Phone: [phone]; Fax: [phone]

## 2019-09-16 NOTE — Progress Notes (Signed)
Structural Heart Clinic Consult Note  Chief Complaint  Patient presents with  . New Patient (Initial Visit)    Aortic stenosis   History of Present Illness: 59 yo male with history of atrial flutter, DM, HTN, hypothyroidism, obesity and moderate aortic stenosis here today to establish in the structural heart clinic. He was admitted to [facility] 08/29/19 with atypical chest pain and dizziness. CTA with no evidence of PE. Echo 08/29/19 with LVEF=60-65%. Moderate to severe aortic stenosis with mean gradient of 30 mmHg, AVA 0.88 cm2, dimensionless index 0.24. He was started on Diltiazem and Eliquis.    He tells me today that he is doing well. No chest pain, dyspnea, dizziness, near syncope or syncope.   Primary Care Physician: Patient, No Pcp Per Primary Cardiologist: Bjorn Pippin Referring Cardiologist: Bjorn Pippin   Past Medical History:  Diagnosis Date  . Aortic stenosis   . Diabetes mellitus (HCC)   . Essential hypertension   . Hypothyroidism   . Morbid obesity (HCC)   . Paroxysmal atrial flutter [facility])     Past Surgical History:  Procedure Laterality Date  . CHOLECYSTECTOMY      Current Outpatient Medications  Medication Sig Dispense Refill  . apixaban (ELIQUIS) 5 MG TABS tablet Take 1 tablet (5 mg total) by mouth 2 (two) times daily. 60 tablet 6  . diltiazem (CARDIZEM CD) 120 MG 24 hr capsule Take 1 capsule (120 mg total) by mouth daily. 30 capsule 6  . metFORMIN (GLUCOPHAGE) 500 MG tablet Take 1 tablet (500 mg total) by mouth daily with breakfast. Do not start until 09/01/19. 30 tablet 1  . pantoprazole (PROTONIX) 40 MG tablet Take 1 tablet (40 mg total) by mouth daily. 30 tablet 1   No current facility-administered medications for this visit.    No Known Allergies  Social History   Socioeconomic History  . Marital status: Divorced    Spouse name: Not on file  . Number of children: 0  . Years of education: Not on file  . Highest education level: Not on file   Occupational History  . Occupation: Works at [employer]  . Smoking status: Never Smoker  . Smokeless tobacco: Never Used  Substance and Sexual Activity  . Alcohol use: No  . Drug use: No  . Sexual activity: Not on file  Other Topics Concern  . Not on file  Social History Narrative  . Not on file   Social Determinants of Health   Financial Resource Strain:   . Difficulty of Paying Living Expenses: Not on file  Food Insecurity:   . Worried About Programme researcher, broadcasting/film/video in the Last Year: Not on file  . Ran Out of Food in the Last Year: Not on file  Transportation Needs:   . Lack of Transportation (Medical): Not on file  . Lack of Transportation (Non-Medical): Not on file  Physical Activity:   . Days of Exercise per Week: Not on file  . Minutes of Exercise per Session: Not on file  Stress:   . Feeling of Stress : Not on file  Social Connections:   . Frequency of Communication with Friends and Family: Not on file  . Frequency of Social Gatherings with Friends and Family: Not on file  . Attends Religious Services: Not on file  . Active Member of Clubs or Organizations: Not on file  . Attends Banker Meetings: Not on file  . Marital Status: Not on file  Intimate Partner Violence:   .  Fear of Current or Ex-Partner: Not on file  . Emotionally Abused: Not on file  . Physically Abused: Not on file  . Sexually Abused: Not on file    Family History  Problem Relation Age of Onset  . Schizophrenia Mother   . COPD Mother   . Epilepsy Father   . Schizophrenia Brother     Review of Systems:  As stated in the HPI and otherwise negative.   BP (!) 142/70   Pulse 74   Ht 6\' 2"  (1.88 m)   Wt (!) 317 lb 1.9 oz (143.8 kg)   SpO2 96%   BMI 40.72 kg/m   Physical Examination: General: Well developed, well nourished, NAD  HEENT: OP clear, mucus membranes moist  SKIN: warm, dry. No rashes. Neuro: No focal deficits  Musculoskeletal:  Muscle strength 5/5 all ext  Psychiatric: Mood and affect normal  Neck: No JVD, no carotid bruits, no thyromegaly, no lymphadenopathy.  Lungs:Clear bilaterally, no wheezes, rhonci, crackles Cardiovascular: Regular rate and rhythm. Loud, harsh, late peaking systolic murmur.  Abdomen:Soft. Bowel sounds present. Non-tender.  Extremities: No lower extremity edema. Pulses are 2 + in the bilateral DP/PT.  EKG:  EKG is not ordered today. The ekg ordered today demonstrates   Echo 08/29/19:    1. Left ventricular ejection fraction, by visual estimation, is 60 to 65%. The left ventricle has normal function. There is mildly increased left ventricular hypertrophy.  2. The left ventricle has no regional wall motion abnormalities.  3. Global right ventricle has normal systolic function.The right ventricular size is normal. No increase in right ventricular wall thickness.  4. Left atrial size was normal.  5. Right atrial size was normal.  6. The mitral valve is normal in structure. Trivial mitral valve regurgitation. No evidence of mitral stenosis.  7. The tricuspid valve is normal in structure.  8. The aortic valve is grossly normal. Aortic valve regurgitation is not visualized. Moderate to severe aortic valve stenosis.  9. The pulmonic valve was normal in structure. Pulmonic valve regurgitation is not visualized.  10. Normal pulmonary artery systolic pressure.  11. The inferior vena cava is normal in size with greater than 50% respiratory variability, suggesting right atrial pressure of 3 mmHg.    FINDINGS  Left Ventricle: Left ventricular ejection fraction, by visual estimation, is 60 to 65%. The left ventricle has normal function. The left ventricle has no regional wall motion abnormalities. The left ventricular internal cavity size was the left  ventricle is normal in size. There is mildly increased left ventricular hypertrophy. Concentric left ventricular hypertrophy. Left ventricular  diastolic parameters were normal. Normal left atrial pressure.    Right Ventricle: The right ventricular size is normal. No increase in right ventricular wall thickness. Global RV systolic function is has normal systolic function. The tricuspid regurgitant velocity is 2.35 m/s, and with an assumed right atrial pressure  of 3 mmHg, the estimated right ventricular systolic pressure is normal at 25.1 mmHg.    Left Atrium: Left atrial size was normal in size.    Right Atrium: Right atrial size was normal in size    Pericardium: There is no evidence of pericardial effusion.    Mitral Valve: The mitral valve is normal in structure. Trivial mitral valve regurgitation. No evidence of mitral valve stenosis by observation.    Tricuspid Valve: The tricuspid valve is normal in structure. Tricuspid valve regurgitation is not demonstrated.    Aortic Valve: The aortic valve is grossly normal.. There is moderate  thickening and moderate calcification of the aortic valve. Aortic valve regurgitation is not visualized. Moderate to severe aortic stenosis is present. There is moderate thickening of  the aortic valve. There is moderate calcification of the aortic valve. Aortic valve mean gradient measures 30.3 mmHg. Aortic valve peak gradient measures 59.0 mmHg. Aortic valve area, by VTI measures 1.03 cm.    Pulmonic Valve: The pulmonic valve was normal in structure. Pulmonic valve regurgitation is not visualized. Pulmonic regurgitation is not visualized.    Aorta: The aortic root, ascending aorta and aortic arch are all structurally normal, with no evidence of dilitation or obstruction.    Venous: The inferior vena cava is normal in size with greater than 50% respiratory variability, suggesting right atrial pressure of 3 mmHg.    IAS/Shunts: No atrial level shunt detected by color flow Doppler. There is no evidence of a patent foramen ovale. No ventricular septal defect is seen or detected. There is  no evidence of an atrial septal defect.      LEFT VENTRICLE  PLAX 2D  LVIDd: 4.91 cm Diastology  LVIDs: 3.94 cm LV e' lateral: 8.93 cm/s  LV PW: 1.28 cm LV E/e' lateral: 7.7  LV IVS: 1.30 cm LV e' medial: 5.66 cm/s  LVOT diam: 2.34 cm LV E/e' medial: 12.1  LV SV: 46 ml  LV SV Index: 16.42  LVOT Area: 4.30 cm    LV Volumes (MOD)  LV area d, A2C: 31.80 cm  LV area d, A4C: 38.50 cm  LV area s, A2C: 16.50 cm  LV area s, A4C: 20.40 cm  LV major d, A2C: 7.73 cm  LV major d, A4C: 8.64 cm  LV major s, A2C: 6.19 cm  LV major s, A4C: 6.97 cm  LV vol d, MOD A2C: 111.0 ml  LV vol d, MOD A4C: 139.0 ml  LV vol s, MOD A2C: 39.0 ml  LV vol s, MOD A4C: 51.1 ml  LV SV MOD A2C: 72.0 ml  LV SV MOD A4C: 139.0 ml  LV SV MOD BP: 83.5 ml    RIGHT VENTRICLE  RV S prime: 11.70 cm/s  TAPSE (M-mode): 2.1 cm    LEFT ATRIUM Index RIGHT ATRIUM Index  LA diam: 3.90 cm 1.48 cm/m RA Area: 15.00 cm  LA Vol (A2C): 46.7 ml 17.70 ml/m RA Volume: 37.10 ml 14.06 ml/m  LA Vol (A4C): 43.8 ml 16.60 ml/m  LA Biplane Vol: 48.0 ml 18.19 ml/m  AORTIC VALVE  AV Area (Vmax): 0.88 cm  AV Area (Vmean): 0.96 cm  AV Area (VTI): 1.03 cm  AV Vmax: 384.14 cm/s  AV Vmean: 252.881 cm/s  AV VTI: 0.797 m  AV Peak Grad: 59.0 mmHg  AV Mean Grad: 30.3 mmHg  LVOT Vmax: 78.30 cm/s  LVOT Vmean: 56.200 cm/s  LVOT VTI: 0.191 m  LVOT/AV VTI ratio: 0.24    AORTA  Ao Root diam: 3.10 cm    MITRAL VALVE TRICUSPID VALVE  MV Area (PHT): 2.80 cm TR Peak grad: 22.1 mmHg  MV PHT: 78.59 msec TR Vmax: 235.00 cm/s  MV Decel Time: 271 msec  MV E velocity: 68.60 cm/s 103 cm/s SHUNTS  MV A velocity: 58.30 cm/s 70.3  cm/s Systemic VTI: 0.19 m  MV E/A ratio: 1.18 1.5 Systemic Diam: 2.34 cm    Recent Labs: 08/28/2019: ALT 20; B Natriuretic Peptide 51.0; TSH 7.935 08/29/2019: BUN 11; Creatinine, Ser 0.92; Hemoglobin 13.4; Platelets 220; Potassium 3.9; Sodium 140   Lipid Panel    Component Value Date/Time  CHOL 193 08/28/2019 1809   TRIG 97 08/28/2019 1809   HDL 40 (L) 08/28/2019 1809   CHOLHDL 4.8 08/28/2019 1809   VLDL 19 08/28/2019 1809   LDLCALC 134 (H) 08/28/2019 1809     Wt Readings from Last 3 Encounters:  09/20/19 (!) 317 lb 1.9 oz (143.8 kg)  09/09/19 (!) 312 lb (141.5 kg)  08/28/19 (!) 315 lb 8 oz (143.1 kg)     Other studies Reviewed: Additional studies/ records that were reviewed today include: echo images,hosptial records Review of the above records demonstrates:   Assessment and Plan:   1. Severe Aortic Valve Stenosis:  1. Severe Aortic Valve Stenosis: He has moderate to severe aortic stenosis. I have personally reviewed the echo images. The aortic valve is thickened, calcified but the leaflets open. No indication at this time to proceed with planning for AVR. I have spent 45 minutes today reviewing his aortic valve pathology and possible treatments including surgical AVR and TAVR. Will follow with repeat echo in 6 months. He will call with a change in his clinical status.    Current medicines are reviewed at length with the patient today.  The patient does not have concerns regarding medicines.  The following changes have been made:  no change  Labs/ tests ordered today include:   Orders Placed This Encounter  Procedures  . ECHOCARDIOGRAM COMPLETE     Disposition:   FU with me in 6 months.    Signed, Verne Carrow, MD 09/20/2019 10:06 AM    [facility] [address] [place], [place], [place]  [address] Phone: [phone]; Fax: [phone]

## 2019-09-20 ENCOUNTER — Ambulatory Visit (INDEPENDENT_AMBULATORY_CARE_PROVIDER_SITE_OTHER): Payer: No Typology Code available for payment source | Admitting: Cardiovascular Disease

## 2019-09-20 ENCOUNTER — Encounter: Payer: Self-pay | Admitting: Cardiovascular Disease

## 2019-09-20 ENCOUNTER — Other Ambulatory Visit: Payer: Self-pay

## 2019-09-20 VITALS — BP 142/70 | HR 74 | Ht 74.0 in | Wt 317.1 lb

## 2019-09-20 DIAGNOSIS — I350 Nonrheumatic aortic (valve) stenosis: Secondary | ICD-10-CM | POA: Diagnosis not present

## 2019-09-20 NOTE — Patient Instructions (Signed)
Medication Instructions:  Your provider recommends that you continue on your current medications as directed. Please refer to the Current Medication list given to you today.   *If you need a refill on your cardiac medications before your next appointment, please call your pharmacy*  Testing/Procedures: Your provider has requested that you have an echocardiogram in 6 months. Echocardiography is a painless test that uses sound waves to create images of your heart. It provides your doctor with information about the size and shape of your heart and how well your heart's chambers and valves are working. This procedure takes approximately one hour. There are no restrictions for this procedure.  Your next appointment:   We will call you to arrange your 6 month echo and office visit with Dr. Clifton James.

## 2019-09-27 ENCOUNTER — Ambulatory Visit
Admission: EM | Admit: 2019-09-27 | Discharge: 2019-09-27 | Disposition: A | Discharge status: Home / Self Care | Payer: No Typology Code available for payment source | Attending: Emergency Medicine | Admitting: Emergency Medicine

## 2019-09-27 ENCOUNTER — Other Ambulatory Visit: Payer: Self-pay

## 2019-09-27 DIAGNOSIS — Z20822 Contact with and (suspected) exposure to covid-19: Secondary | ICD-10-CM | POA: Diagnosis not present

## 2019-09-27 NOTE — ED Triage Notes (Signed)
Pt presents to [facility] for covid test. Pt had positive covid exposure. Denies symptoms at this time.

## 2019-09-27 NOTE — Discharge Instructions (Signed)

## 2019-09-27 NOTE — ED Provider Notes (Signed)
[facility]   [id number] 09/27/19 Arrival Time: 1412   CC: COVID testing  SUBJECTIVE: History from: patient.  Johnathan Richmond is a 60 y.o. male who presents for COVID testing.  Admits to COVID exposure 8 days ago to coworker.  Denies recent travel.  Denies aggravating or alleviating symptoms.  Denies previous COVID infection.   Denies fever, chills, fatigue, nasal congestion, rhinorrhea, sore throat, cough, SOB, wheezing, chest pain, nausea, vomiting, changes in bowel or bladder habits.    ROS: As per HPI.  All other pertinent ROS negative.     Past Medical History:  Diagnosis Date  . Aortic stenosis   . Diabetes mellitus (HCC)   . Essential hypertension   . Hypothyroidism   . Morbid obesity (HCC)   . Paroxysmal atrial flutter [facility])    Past Surgical History:  Procedure Laterality Date  . CHOLECYSTECTOMY     No Known Allergies No current facility-administered medications on file prior to encounter.   Current Outpatient Medications on File Prior to Encounter  Medication Sig Dispense Refill  . apixaban (ELIQUIS) 5 MG TABS tablet Take 1 tablet (5 mg total) by mouth 2 (two) times daily. 60 tablet 6  . diltiazem (CARDIZEM CD) 120 MG 24 hr capsule Take 1 capsule (120 mg total) by mouth daily. 30 capsule 6  . metFORMIN (GLUCOPHAGE) 500 MG tablet Take 1 tablet (500 mg total) by mouth daily with breakfast. Do not start until 09/01/19. 30 tablet 1  . pantoprazole (PROTONIX) 40 MG tablet Take 1 tablet (40 mg total) by mouth daily. 30 tablet 1   Social History   Socioeconomic History  . Marital status: Divorced    Spouse name: Not on file  . Number of children: 0  . Years of education: Not on file  . Highest education level: Not on file  Occupational History  . Occupation: Works at [employer]  . Smoking status: Never Smoker  . Smokeless tobacco: Never Used  Substance and Sexual Activity  . Alcohol use: No  . Drug use: No  .  Sexual activity: Not on file  Other Topics Concern  . Not on file  Social History Narrative  . Not on file   Social Determinants of Health   Financial Resource Strain:   . Difficulty of Paying Living Expenses: Not on file  Food Insecurity:   . Worried About Programme researcher, broadcasting/film/video in the Last Year: Not on file  . Ran Out of Food in the Last Year: Not on file  Transportation Needs:   . Lack of Transportation (Medical): Not on file  . Lack of Transportation (Non-Medical): Not on file  Physical Activity:   . Days of Exercise per Week: Not on file  . Minutes of Exercise per Session: Not on file  Stress:   . Feeling of Stress : Not on file  Social Connections:   . Frequency of Communication with Friends and Family: Not on file  . Frequency of Social Gatherings with Friends and Family: Not on file  . Attends Religious Services: Not on file  . Active Member of Clubs or Organizations: Not on file  . Attends Banker Meetings: Not on file  . Marital Status: Not on file  Intimate Partner Violence:   . Fear of Current or Ex-Partner: Not on file  . Emotionally Abused: Not on file  . Physically Abused: Not on file  . Sexually Abused: Not on file   Family History  Problem Relation Age of Onset  . Schizophrenia Mother   . COPD Mother   . Epilepsy Father   . Schizophrenia Brother     OBJECTIVE:  Vitals:   09/27/19 1424  BP: 137/72  Pulse: 93  Resp: 16  Temp: 98.1 F (36.7 C)  TempSrc: Oral  SpO2: 96%     General appearance: alert; well-appearing, nontoxic; speaking in full sentences and tolerating own secretions HEENT: NCAT; Ears: EACs clear, TMs pearly gray; Eyes: PERRL.  EOM grossly intact. Nose: nares patent without rhinorrhea, Throat: oropharynx clear, tonsils non erythematous or enlarged, uvula midline  Neck: supple without LAD Lungs: unlabored respirations, symmetrical air entry; cough: absent; no respiratory distress; CTAB Heart: Systolic murmur  present Skin: warm and dry Psychological: alert and cooperative; normal mood and affect  ASSESSMENT & PLAN:   1. Exposure to COVID-19 virus    COVID testing ordered.  It will take between 5-7 days for test results.  Someone will contact you regarding abnormal results.    In the meantime: You should remain isolated in your home for 10 days from symptom onset AND greater than 72 hours after symptoms resolution (absence of fever without the use of fever-reducing medication and improvement in respiratory symptoms), whichever is longer OR 14 days from exposure Get plenty of rest and push fluids Use OTC zyrtec for nasal congestion, runny nose, and/or sore throat Use OTC flonase for nasal congestion and runny nose Use medications daily for symptom relief Use OTC medications like ibuprofen or tylenol as needed fever or pain Call or go to the ED if you have any new or worsening symptoms such as fever, cough, shortness of breath, chest tightness, chest pain, turning blue, changes in mental status, etc...   Reviewed expectations re: course of current medical issues. Questions answered. Outlined signs and symptoms indicating need for more acute intervention. Patient verbalized understanding. After Visit Summary given.         Lestine Box, PA-C 09/27/19 1453

## 2019-09-28 LAB — NOVEL CORONAVIRUS, NAA: SARS-CoV-2, NAA: NOT DETECTED

## 2019-10-28 ENCOUNTER — Ambulatory Visit: Payer: No Typology Code available for payment source | Admitting: Dietician

## 2019-12-09 ENCOUNTER — Ambulatory Visit: Payer: No Typology Code available for payment source | Admitting: Cardiology

## 2019-12-09 NOTE — Progress Notes (Deleted)
     Clinical Summary Mr. Thornsberry is a 59 y.o.male    1. Aortic stenosis - moderate to severe AS - 08/2019 echo LVEF 60-65%, mean grad 30, AVA VTI 1, dimensionless index 0.24 - followed by valve clnic   2. Aflutter   3. HTN     Past Medical History:  Diagnosis Date  . Aortic stenosis   . Diabetes mellitus (HCC)   . Essential hypertension   . Hypothyroidism   . Morbid obesity (HCC)   . Paroxysmal atrial flutter (HCC)      No Known Allergies   Current Outpatient Medications  Medication Sig Dispense Refill  . apixaban (ELIQUIS) 5 MG TABS tablet Take 1 tablet (5 mg total) by mouth 2 (two) times daily. 60 tablet 6  . diltiazem (CARDIZEM CD) 120 MG 24 hr capsule Take 1 capsule (120 mg total) by mouth daily. 30 capsule 6  . metFORMIN (GLUCOPHAGE) 500 MG tablet Take 1 tablet (500 mg total) by mouth daily with breakfast. Do not start until 09/01/19. 30 tablet 1  . pantoprazole (PROTONIX) 40 MG tablet Take 1 tablet (40 mg total) by mouth daily. 30 tablet 1   No current facility-administered medications for this visit.     Past Surgical History:  Procedure Laterality Date  . CHOLECYSTECTOMY       No Known Allergies    Family History  Problem Relation Age of Onset  . Schizophrenia Mother   . COPD Mother   . Epilepsy Father   . Schizophrenia Brother      Social History Mr. Doswell reports that he has never smoked. He has never used smokeless tobacco. Mr. Mauch reports no history of alcohol use.   Review of Systems CONSTITUTIONAL: No weight loss, fever, chills, weakness or fatigue.  HEENT: Eyes: No visual loss, blurred vision, double vision or yellow sclerae.No hearing loss, sneezing, congestion, runny nose or sore throat.  SKIN: No rash or itching.  CARDIOVASCULAR:  RESPIRATORY: No shortness of breath, cough or sputum.  GASTROINTESTINAL: No anorexia, nausea, vomiting or diarrhea. No abdominal pain or blood.  GENITOURINARY: No burning on urination, no  polyuria NEUROLOGICAL: No headache, dizziness, syncope, paralysis, ataxia, numbness or tingling in the extremities. No change in bowel or bladder control.  MUSCULOSKELETAL: No muscle, back pain, joint pain or stiffness.  LYMPHATICS: No enlarged nodes. No history of splenectomy.  PSYCHIATRIC: No history of depression or anxiety.  ENDOCRINOLOGIC: No reports of sweating, cold or heat intolerance. No polyuria or polydipsia.  Marland Kitchen   Physical Examination There were no vitals filed for this visit. There were no vitals filed for this visit.  Gen: resting comfortably, no acute distress HEENT: no scleral icterus, pupils equal round and reactive, no palptable cervical adenopathy,  CV Resp: Clear to auscultation bilaterally GI: abdomen is soft, non-tender, non-distended, normal bowel sounds, no hepatosplenomegaly MSK: extremities are warm, no edema.  Skin: warm, no rash Neuro:  no focal deficits Psych: appropriate affect   Diagnostic Studies     Assessment and Plan        Antoine Poche, M.D., F.A.C.C.

## 2019-12-20 ENCOUNTER — Encounter (HOSPITAL_COMMUNITY): Payer: Self-pay | Admitting: *Deleted

## 2019-12-20 ENCOUNTER — Ambulatory Visit (INDEPENDENT_AMBULATORY_CARE_PROVIDER_SITE_OTHER)
Admission: EM | Admit: 2019-12-20 | Discharge: 2019-12-20 | Disposition: A | Discharge status: Home / Self Care | Payer: No Typology Code available for payment source | Source: Home / Self Care

## 2019-12-20 ENCOUNTER — Other Ambulatory Visit: Payer: Self-pay

## 2019-12-20 ENCOUNTER — Emergency Department (HOSPITAL_COMMUNITY)
Admission: EM | Admit: 2019-12-20 | Discharge: 2019-12-20 | Disposition: A | Discharge status: Home / Self Care | Payer: No Typology Code available for payment source | Attending: Emergency Medicine | Admitting: Emergency Medicine

## 2019-12-20 DIAGNOSIS — E119 Type 2 diabetes mellitus without complications: Secondary | ICD-10-CM | POA: Insufficient documentation

## 2019-12-20 DIAGNOSIS — E039 Hypothyroidism, unspecified: Secondary | ICD-10-CM | POA: Diagnosis not present

## 2019-12-20 DIAGNOSIS — Z7984 Long term (current) use of oral hypoglycemic drugs: Secondary | ICD-10-CM | POA: Insufficient documentation

## 2019-12-20 DIAGNOSIS — I350 Nonrheumatic aortic (valve) stenosis: Secondary | ICD-10-CM | POA: Diagnosis not present

## 2019-12-20 DIAGNOSIS — R9431 Abnormal electrocardiogram [ECG] [EKG]: Secondary | ICD-10-CM

## 2019-12-20 DIAGNOSIS — R42 Dizziness and giddiness: Secondary | ICD-10-CM

## 2019-12-20 DIAGNOSIS — I10 Essential (primary) hypertension: Secondary | ICD-10-CM | POA: Diagnosis not present

## 2019-12-20 DIAGNOSIS — Z7901 Long term (current) use of anticoagulants: Secondary | ICD-10-CM | POA: Diagnosis not present

## 2019-12-20 LAB — CBC WITH DIFFERENTIAL/PLATELET
Abs Immature Granulocytes: 0.01 10*3/uL (ref 0.00–0.07)
Basophils Absolute: 0 10*3/uL (ref 0.0–0.1)
Basophils Relative: 1 %
Eosinophils Absolute: 0 10*3/uL (ref 0.0–0.5)
Eosinophils Relative: 1 %
HCT: 44 % (ref 39.0–52.0)
Hemoglobin: 14.3 g/dL (ref 13.0–17.0)
Immature Granulocytes: 0 %
Lymphocytes Relative: 33 %
Lymphs Abs: 1.5 10*3/uL (ref 0.7–4.0)
MCH: 28 pg (ref 26.0–34.0)
MCHC: 32.5 g/dL (ref 30.0–36.0)
MCV: 86.1 fL (ref 80.0–100.0)
Monocytes Absolute: 0.3 10*3/uL (ref 0.1–1.0)
Monocytes Relative: 7 %
Neutro Abs: 2.6 10*3/uL (ref 1.7–7.7)
Neutrophils Relative %: 58 %
Platelets: 220 10*3/uL (ref 150–400)
RBC: 5.11 MIL/uL (ref 4.22–5.81)
RDW: 14.2 % (ref 11.5–15.5)
WBC: 4.4 10*3/uL (ref 4.0–10.5)
nRBC: 0 % (ref 0.0–0.2)

## 2019-12-20 LAB — COMPREHENSIVE METABOLIC PANEL
ALT: 27 U/L (ref 0–44)
AST: 23 U/L (ref 15–41)
Albumin: 3.6 g/dL (ref 3.5–5.0)
Alkaline Phosphatase: 78 U/L (ref 38–126)
Anion gap: 10 (ref 5–15)
BUN: 7 mg/dL (ref 6–20)
CO2: 24 mmol/L (ref 22–32)
Calcium: 8.2 mg/dL — ABNORMAL LOW (ref 8.9–10.3)
Chloride: 104 mmol/L (ref 98–111)
Creatinine, Ser: 1.03 mg/dL (ref 0.61–1.24)
GFR calc Af Amer: >60 mL/min (ref >60)
GFR calc non Af Amer: >60 mL/min (ref >60)
Glucose, Bld: 156 mg/dL — ABNORMAL HIGH (ref 70–99)
Potassium: 3.9 mmol/L (ref 3.5–5.1)
Sodium: 138 mmol/L (ref 135–145)
Total Bilirubin: 0.8 mg/dL (ref 0.3–1.2)
Total Protein: 6.9 g/dL (ref 6.5–8.1)

## 2019-12-20 LAB — POCT FASTING CBG KUC MANUAL ENTRY: POCT Glucose (KUC): 165 mg/dL — AB (ref 70–99)

## 2019-12-20 MED ORDER — DILTIAZEM HCL ER COATED BEADS 120 MG PO CP24: 120.0000 mg | ORAL_CAPSULE | Freq: Every day | ORAL | 6 refills | Status: DC

## 2019-12-20 MED ORDER — APIXABAN 5 MG PO TABS: 5.0000 mg | ORAL_TABLET | Freq: Two times a day (BID) | ORAL | 6 refills | Status: DC

## 2019-12-20 MED ORDER — METFORMIN HCL 500 MG PO TABS: 500.0000 mg | ORAL_TABLET | Freq: Every day | ORAL | 1 refills | Status: DC

## 2019-12-20 MED ORDER — PANTOPRAZOLE SODIUM 40 MG PO TBEC: 40.0000 mg | DELAYED_RELEASE_TABLET | Freq: Every day | ORAL | 1 refills | Status: DC

## 2019-12-20 NOTE — ED Provider Notes (Signed)
[place]   [id number] 12/20/19 Arrival Time: 56  CC: Lightheadedness  SUBJECTIVE:  Johnathan Richmond is a 59 y.o. male hx significant for aortic stenosis, DM, HTN, hypothyroid, morbid obesity, and paroxysmal atrial flutter, who presents with complaint of lightheadedness that began the last two nights.  Denies a precipitating event, or trauma.  Speculates his sugar may have been low at that time.  Describes the lightheaded as feeling "off balance." States that it is intermittent with episodes lasting 30 minutes.  Has tried rest with relief.  Symptoms made worse with sudden movement.  Admits to previous symptoms with hypoglycemia.  Denies fever, chills, nausea, vomiting, hearing changes, chest pain, syncope, SOB, weakness, slurred speech, memory or emotional changes, facial drooping/ asymmetry, incoordination, numbness or tingling, abdominal pain, changes in bowel or bladder habits.    Ran out of medications 3 weeks ago.    ROS: As per HPI.  All other pertinent ROS negative.    Past Medical History:  Diagnosis Date  . Aortic stenosis   . Diabetes mellitus ([place])   . Essential hypertension   . Hypothyroidism   . Morbid obesity ([place])   . Paroxysmal atrial flutter [facility])    Past Surgical History:  Procedure Laterality Date  . CHOLECYSTECTOMY     No Known Allergies No current facility-administered medications on file prior to encounter.   Current Outpatient Medications on File Prior to Encounter  Medication Sig Dispense Refill  . apixaban (ELIQUIS) 5 MG TABS tablet Take 1 tablet (5 mg total) by mouth 2 (two) times daily. 60 tablet 6  . diltiazem (CARDIZEM CD) 120 MG 24 hr capsule Take 1 capsule (120 mg total) by mouth daily. 30 capsule 6  . metFORMIN (GLUCOPHAGE) 500 MG tablet Take 1 tablet (500 mg total) by mouth daily with breakfast. Do not start until 09/01/19. 30 tablet 1  . pantoprazole (PROTONIX) 40 MG tablet Take 1 tablet (40 mg total) by mouth daily. 30 tablet 1    Social History   Socioeconomic History  . Marital status: Divorced    Spouse name: Not on file  . Number of children: 0  . Years of education: Not on file  . Highest education level: Not on file  Occupational History  . Occupation: Works at [employer]  . Smoking status: Never Smoker  . Smokeless tobacco: Never Used  Substance and Sexual Activity  . Alcohol use: No  . Drug use: No  . Sexual activity: Not on file  Other Topics Concern  . Not on file  Social History Narrative  . Not on file   Social Determinants of Health   Financial Resource Strain:   . Difficulty of Paying Living Expenses:   Food Insecurity:   . Worried About Charity fundraiser in the Last Year:   . Arboriculturist in the Last Year:   Transportation Needs:   . Film/video editor (Medical):   Marland Kitchen Lack of Transportation (Non-Medical):   Physical Activity:   . Days of Exercise per Week:   . Minutes of Exercise per Session:   Stress:   . Feeling of Stress :   Social Connections:   . Frequency of Communication with Friends and Family:   . Frequency of Social Gatherings with Friends and Family:   . Attends Religious Services:   . Active Member of Clubs or Organizations:   . Attends Archivist Meetings:   Marland Kitchen Marital Status:   Intimate  Partner Violence:   . Fear of Current or Ex-Partner:   . Emotionally Abused:   Marland Kitchen Physically Abused:   . Sexually Abused:    Family History  Problem Relation Age of Onset  . Schizophrenia Mother   . COPD Mother   . Epilepsy Father   . Schizophrenia Brother     OBJECTIVE:  Vitals:   12/20/19 1339  BP: (!) 160/72  Pulse: 83  Resp: 17  Temp: 97.7 F (36.5 C)  TempSrc: Oral  SpO2: 96%    General appearance: alert; no distress Eyes: PERRLA; EOMI; conjunctiva normal HENT: normocephalic; atraumatic; EACs clear; oropharynx clear Neck: supple with FROM Lungs: clear to auscultation bilaterally Heart: systolic  murmur Abdomen: soft, protuberant non-tender; bowel sounds normal Extremities: no cyanosis or edema; symmetrical with no gross deformities Skin: warm and dry Neurologic: normal gait; CN 2-12 grossly intact Psychological: alert and cooperative; normal mood and affect  Labs:  Results for orders placed or performed during the hospital encounter of 12/20/19 (from the past 24 hour(s))  POCT CBG (manual entry)     Status: Abnormal   Collection Time: 12/20/19  1:39 PM  Result Value Ref Range   POCT Glucose ([facility]) 165 (A) 70 - 99 mg/dL   Orders placed or performed during the hospital encounter of 12/20/19  . ED EKG  . ED EKG    EKG normal sinus rhythm with ST depression in lead II, no consecutive lead involvement.  No narrowing or widening of the QRS complexes. Changes from past EKG.    ASSESSMENT & PLAN:  1. Lightheadedness   2. Nonspecific abnormal electrocardiogram (ECG) (EKG)    Recommending further evaluation and management in the ED for lightheadedness and EKG changes/ abnormalities.  Hx significant for aortic stenosis, HTN, and paroxysmal atrial flutter.  Has been out of medications x 2 weeks.  VS stable with mild elevated blood pressure on exam.  Exam unremarkable.  Patient declines EMS transport at this time.  Reports he is asymptomatic and would like to drive himself to [facility] ED.    Reviewed expectations re: course of current medical issues. Questions answered. Outlined signs and symptoms indicating need for more acute intervention. Patient verbalized understanding. After Visit Summary given.    Rennis Harding, PA-C 12/20/19 1420

## 2019-12-20 NOTE — ED Provider Notes (Signed)
[facility] EMERGENCY DEPARTMENT Provider Note   CSN: [id number] Arrival date & time: 12/20/19  1417     History Chief Complaint  Patient presents with  . Abnormal ECG    Johnathan Richmond is a 59 y.o. male.  HPI   Pt presents to the ED for evaluation of lightheadedness.  Pt states it started a couple of days ago.  He states it comes and goes.  He is not having any trouble with chest pain, shortness of breath, abdominal pain, vomiting or diarrhea.  No trouble with his speech, balance or vision.  Pt does have history of DM, HTN, AS.  Pt staes he does not have his medications.  When he went to pick them up they were not available. Pt went to an urgent care today and they sent him to the ED.  Past Medical History:  Diagnosis Date  . Aortic stenosis   . Diabetes mellitus ([facility])   . Essential hypertension   . Hypothyroidism   . Morbid obesity ([facility])   . Paroxysmal atrial flutter [facility])     Patient Active Problem List   Diagnosis Date Noted  . Aortic stenosis 08/29/2019  . History of atrial flutter 08/29/2019  . Subclinical hypothyroidism 08/29/2019  . Lower extremity edema 08/29/2019  . Essential hypertension 08/29/2019  . Type 2 diabetes mellitus without complication, without long-term current use of insulin (H[facility]) 08/29/2019  . Atypical chest pain 08/28/2019    Past Surgical History:  Procedure Laterality Date  . CHOLECYSTECTOMY         Family History  Problem Relation Age of Onset  . Schizophrenia Mother   . COPD Mother   . Epilepsy Father   . Schizophrenia Brother     Social History   Tobacco Use  . Smoking status: Never Smoker  . Smokeless tobacco: Never Used  Substance Use Topics  . Alcohol use: No  . Drug use: No    Home Medications Prior to Admission medications   Medication Sig Start Date End Date Taking? Authorizing Provider  apixaban (ELIQUIS) 5 MG TABS tablet Take 1 tablet (5 mg total) by mouth 2 (two) times daily. 12/20/19   Linwood Dibbles, MD    diltiazem (CARDIZEM CD) 120 MG 24 hr capsule Take 1 capsule (120 mg total) by mouth daily. 12/20/19   Linwood Dibbles, MD  metFORMIN (GLUCOPHAGE) 500 MG tablet Take 1 tablet (500 mg total) by mouth daily with breakfast. Do not start until 09/01/19. 12/20/19 12/19/20  Linwood Dibbles, MD  pantoprazole (PROTONIX) 40 MG tablet Take 1 tablet (40 mg total) by mouth daily. 12/20/19   Linwood Dibbles, MD    Allergies    Patient has no known allergies.  Review of Systems   Review of Systems  All other systems reviewed and are negative.   Physical Exam Updated Vital Signs BP 122/64   Pulse 76   Temp 98.6 F (37 C)   Resp 16   Ht 1.88 m (6\' 2" )   Wt (!) 141.5 kg   SpO2 98%   BMI 40.06 kg/m   Physical Exam Vitals and nursing note reviewed.  Constitutional:      General: He is not in acute distress.    Appearance: He is well-developed.  HENT:     Head: Normocephalic and atraumatic.     Right Ear: External ear normal.     Left Ear: External ear normal.  Eyes:     General: No scleral icterus.       Right  eye: No discharge.        Left eye: No discharge.     Conjunctiva/sclera: Conjunctivae normal.  Neck:     Trachea: No tracheal deviation.  Cardiovascular:     Rate and Rhythm: Normal rate and regular rhythm.     Heart sounds: Murmur present.     Comments: Systolic Pulmonary:     Effort: Pulmonary effort is normal. No respiratory distress.     Breath sounds: Normal breath sounds. No stridor. No wheezing or rales.  Abdominal:     General: Bowel sounds are normal. There is no distension.     Palpations: Abdomen is soft.     Tenderness: There is no abdominal tenderness. There is no guarding or rebound.  Musculoskeletal:        General: No tenderness.     Cervical back: Neck supple.  Skin:    General: Skin is warm and dry.     Findings: No rash.  Neurological:     Mental Status: He is alert and oriented to person, place, and time.     Cranial Nerves: No cranial nerve deficit (No facial  droop, extraocular movements intact, tongue midline ).     Sensory: No sensory deficit.     Motor: No abnormal muscle tone or seizure activity.     Coordination: Coordination normal.     Comments: No pronator drift bilateral upper extrem, able to hold both legs off bed for 5 seconds, sensation intact in all extremities, no visual field cuts, no left or right sided neglect, normal finger-nose exam bilaterally, no nystagmus noted      ED Results / Procedures / Treatments   Labs (all labs ordered are listed, but only abnormal results are displayed) Labs Reviewed  COMPREHENSIVE METABOLIC PANEL - Abnormal; Notable for the following components:      Result Value   Glucose, Bld 156 (*)    Calcium 8.2 (*)    All other components within normal limits  CBC WITH DIFFERENTIAL/PLATELET    EKG None  Radiology No results found.  Procedures Procedures (including critical care time)  Medications Ordered in ED Medications - No data to display  ED Course  I have reviewed the triage vital signs and the nursing notes.  Pertinent labs & imaging results that were available during my care of the patient were reviewed by me and considered in my medical decision making (see chart for details).  Clinical Course as of Dec 19 1620  Fri Dec 20, 2019  1605 Labs reviewed.  No sig abnormalities noted   [JK]    Clinical Course User Index [JK] Dorie Rank, MD   MDM Rules/Calculators/A&P                      Patient presented to ED for evaluation of mild lightheadedness.  In the ED the patient's physical exam is reassuring.  No focal deficits noted.  Vital signs are unremarkable.  Laboratory tests do not show any signs of anemia or acute electrolyte abnormalities.  I doubt stroke, infection cardiac dysrhythmia or other emergent etiology.  It is possible the patient may be having some symptoms associated with his known aortic stenosis.  Discussed outpatient follow-up with cardiologist.  Patient also  requested refills of his medications as he did not have them.   Final Clinical Impression(s) / ED Diagnoses Final diagnoses:  Lightheadedness  Aortic valve stenosis, etiology of cardiac valve disease unspecified    Rx / DC Orders ED Discharge Orders  Ordered    apixaban (ELIQUIS) 5 MG TABS tablet  2 times daily     12/20/19 1621    diltiazem (CARDIZEM CD) 120 MG 24 hr capsule  Daily     12/20/19 1621    metFORMIN (GLUCOPHAGE) 500 MG tablet  Daily with breakfast     12/20/19 1621    pantoprazole (PROTONIX) 40 MG tablet  Daily     12/20/19 1621           Linwood Dibbles, MD 12/20/19 1623

## 2019-12-20 NOTE — ED Notes (Signed)
Pt w hx of aortic stenosis and DM  Hx of a flutter

## 2019-12-20 NOTE — ED Triage Notes (Signed)
Sent from urgent care for evaluation of abnormal ekg

## 2019-12-20 NOTE — ED Notes (Signed)
Monitor   IV  Blood to lab  Dr Lynelle Doctor has evaluated

## 2019-12-20 NOTE — Discharge Instructions (Signed)
Recommending further evaluation and management in the ED for lightheadedness and EKG changes/ abnormalities.  Hx significant for aortic stenosis, HTN, and paroxysmal atrial flutter.  Has been out of medications x 2 weeks.  VS stable with mild elevated blood pressure on exam.  Exam unremarkable.  Patient declines EMS transport at this time.  Reports he is asymptomatic and would like to drive himself to [facility] ED.

## 2019-12-20 NOTE — ED Triage Notes (Signed)
Pt presents with complaints of lightheadedness last night at work. Denies symptoms at this time. Reports he is out of his medications.

## 2019-12-20 NOTE — Discharge Instructions (Signed)
Continue your current medications, follow-up with your cardiologist, return as needed for worsening symptoms

## 2019-12-20 NOTE — ED Notes (Signed)
Sent from [facility] by PA who had concerns of a reported abnormal EKG  Pt denies CP   Cardiac workup   IV  EKG

## 2020-01-17 ENCOUNTER — Telehealth: Payer: Self-pay | Admitting: *Deleted

## 2020-01-17 NOTE — Telephone Encounter (Signed)
Left message for patient to call back to schedule echo and structural visit with Dr. Clifton James in July.

## 2020-02-05 ENCOUNTER — Other Ambulatory Visit: Payer: Self-pay | Admitting: Physician Assistant

## 2020-03-13 ENCOUNTER — Other Ambulatory Visit (HOSPITAL_COMMUNITY): Payer: No Typology Code available for payment source

## 2020-03-13 ENCOUNTER — Ambulatory Visit: Payer: No Typology Code available for payment source | Admitting: Cardiovascular Disease

## 2020-03-13 NOTE — Progress Notes (Deleted)
Structural Heart Clinic Consult Note  No chief complaint on file.  History of Present Illness: 59 yo male with history of atrial flutter, diabetes mellitus, HTN, hypothyroidism, obesity and moderate aortic stenosis here today for follow up in the structural heart clinic to discuss his aortic stenosis. I saw him as a new consult in January 2021. He is followed in our office by Dr. Gardiner Rhyme. He was admitted to [facility] 08/29/19 with atypical chest pain and dizziness. CTA with no evidence of PE. Echo 08/29/19 with LVEF=60-65%. Moderate to severe aortic stenosis with mean gradient of 30 mmHg, AVA 0.88 cm2, dimensionless index 0.24. He was started on Diltiazem and Eliquis. When I met him in January 2021 he was felt to be asymptomatic from his aortic stenosis and no plans were made at that time for AVR.   He is here today for follow up. The patient denies any chest pain, dyspnea, palpitations, lower extremity edema, orthopnea, PND, dizziness, near syncope or syncope.   Primary Care Physician: Patient, No Pcp Per Primary Cardiologist: Gardiner Rhyme Referring Cardiologist: Gardiner Rhyme   Past Medical History:  Diagnosis Date  . Aortic stenosis   . Diabetes mellitus ([place])   . Essential hypertension   . Hypothyroidism   . Morbid obesity ([place])   . Paroxysmal atrial flutter [facility])     Past Surgical History:  Procedure Laterality Date  . CHOLECYSTECTOMY      Current Outpatient Medications  Medication Sig Dispense Refill  . apixaban (ELIQUIS) 5 MG TABS tablet Take 1 tablet (5 mg total) by mouth 2 (two) times daily. 60 tablet 6  . diltiazem (CARDIZEM CD) 120 MG 24 hr capsule Take 1 capsule (120 mg total) by mouth daily. 30 capsule 6  . metFORMIN (GLUCOPHAGE) 500 MG tablet Take 1 tablet (500 mg total) by mouth daily with breakfast. Do not start until 09/01/19. 30 tablet 1  . pantoprazole (PROTONIX) 40 MG tablet Take 1 tablet (40 mg total) by mouth daily. 30 tablet 1   No current facility-administered  medications for this visit.    No Known Allergies  Social History   Socioeconomic History  . Marital status: Divorced    Spouse name: Not on file  . Number of children: 0  . Years of education: Not on file  . Highest education level: Not on file  Occupational History  . Occupation: Works at [employer]  . Smoking status: Never Smoker  . Smokeless tobacco: Never Used  Vaping Use  . Vaping Use: Never used  Substance and Sexual Activity  . Alcohol use: No  . Drug use: No  . Sexual activity: Not on file  Other Topics Concern  . Not on file  Social History Narrative  . Not on file   Social Determinants of Health   Financial Resource Strain:   . Difficulty of Paying Living Expenses:   Food Insecurity:   . Worried About Charity fundraiser in the Last Year:   . Arboriculturist in the Last Year:   Transportation Needs:   . Film/video editor (Medical):   Marland Kitchen Lack of Transportation (Non-Medical):   Physical Activity:   . Days of Exercise per Week:   . Minutes of Exercise per Session:   Stress:   . Feeling of Stress :   Social Connections:   . Frequency of Communication with Friends and Family:   . Frequency of Social Gatherings with Friends and Family:   . Attends Religious Services:   .  Active Member of Clubs or Organizations:   . Attends Archivist Meetings:   Marland Kitchen Marital Status:   Intimate Partner Violence:   . Fear of Current or Ex-Partner:   . Emotionally Abused:   Marland Kitchen Physically Abused:   . Sexually Abused:     Family History  Problem Relation Age of Onset  . Schizophrenia Mother   . COPD Mother   . Epilepsy Father   . Schizophrenia Brother     Review of Systems:  As stated in the HPI and otherwise negative.   There were no vitals taken for this visit.  Physical Examination: General: Well developed, well nourished, NAD  HEENT: OP clear, mucus membranes moist  SKIN: warm, dry. No rashes. Neuro: No focal  deficits  Musculoskeletal: Muscle strength 5/5 all ext  Psychiatric: Mood and affect normal  Neck: No JVD, no carotid bruits, no thyromegaly, no lymphadenopathy.  Lungs:Clear bilaterally, no wheezes, rhonci, crackles Cardiovascular: Regular rate and rhythm. *** Harsh systolic murmur noted.  Abdomen:Soft. Bowel sounds present. Non-tender.  Extremities: No lower extremity edema. Pulses are 2 + in the bilateral DP/PT.  EKG:  EKG is not *** ordered today. The ekg ordered today demonstrates   Echo 08/29/19:    1. Left ventricular ejection fraction, by visual estimation, is 60 to 65%. The left ventricle has normal function. There is mildly increased left ventricular hypertrophy.  2. The left ventricle has no regional wall motion abnormalities.  3. Global right ventricle has normal systolic function.The right ventricular size is normal. No increase in right ventricular wall thickness.  4. Left atrial size was normal.  5. Right atrial size was normal.  6. The mitral valve is normal in structure. Trivial mitral valve regurgitation. No evidence of mitral stenosis.  7. The tricuspid valve is normal in structure.  8. The aortic valve is grossly normal. Aortic valve regurgitation is not visualized. Moderate to severe aortic valve stenosis.  9. The pulmonic valve was normal in structure. Pulmonic valve regurgitation is not visualized.  10. Normal pulmonary artery systolic pressure.  11. The inferior vena cava is normal in size with greater than 50% respiratory variability, suggesting right atrial pressure of 3 mmHg.    FINDINGS  Left Ventricle: Left ventricular ejection fraction, by visual estimation, is 60 to 65%. The left ventricle has normal function. The left ventricle has no regional wall motion abnormalities. The left ventricular internal cavity size was the left  ventricle is normal in size. There is mildly increased left ventricular hypertrophy. Concentric left ventricular  hypertrophy. Left ventricular diastolic parameters were normal. Normal left atrial pressure.    Right Ventricle: The right ventricular size is normal. No increase in right ventricular wall thickness. Global RV systolic function is has normal systolic function. The tricuspid regurgitant velocity is 2.35 m/s, and with an assumed right atrial pressure  of 3 mmHg, the estimated right ventricular systolic pressure is normal at 25.1 mmHg.    Left Atrium: Left atrial size was normal in size.    Right Atrium: Right atrial size was normal in size    Pericardium: There is no evidence of pericardial effusion.    Mitral Valve: The mitral valve is normal in structure. Trivial mitral valve regurgitation. No evidence of mitral valve stenosis by observation.    Tricuspid Valve: The tricuspid valve is normal in structure. Tricuspid valve regurgitation is not demonstrated.    Aortic Valve: The aortic valve is grossly normal.. There is moderate thickening and moderate calcification of the aortic  valve. Aortic valve regurgitation is not visualized. Moderate to severe aortic stenosis is present. There is moderate thickening of  the aortic valve. There is moderate calcification of the aortic valve. Aortic valve mean gradient measures 30.3 mmHg. Aortic valve peak gradient measures 59.0 mmHg. Aortic valve area, by VTI measures 1.03 cm.    Pulmonic Valve: The pulmonic valve was normal in structure. Pulmonic valve regurgitation is not visualized. Pulmonic regurgitation is not visualized.    Aorta: The aortic root, ascending aorta and aortic arch are all structurally normal, with no evidence of dilitation or obstruction.    Venous: The inferior vena cava is normal in size with greater than 50% respiratory variability, suggesting right atrial pressure of 3 mmHg.    IAS/Shunts: No atrial level shunt detected by color flow Doppler. There is no evidence of a patent foramen ovale. No ventricular septal defect  is seen or detected. There is no evidence of an atrial septal defect.      LEFT VENTRICLE  PLAX 2D  LVIDd: 4.91 cm Diastology  LVIDs: 3.94 cm LV e' lateral: 8.93 cm/s  LV PW: 1.28 cm LV E/e' lateral: 7.7  LV IVS: 1.30 cm LV e' medial: 5.66 cm/s  LVOT diam: 2.34 cm LV E/e' medial: 12.1  LV SV: 46 ml  LV SV Index: 16.42  LVOT Area: 4.30 cm    LV Volumes (MOD)  LV area d, A2C: 31.80 cm  LV area d, A4C: 38.50 cm  LV area s, A2C: 16.50 cm  LV area s, A4C: 20.40 cm  LV major d, A2C: 7.73 cm  LV major d, A4C: 8.64 cm  LV major s, A2C: 6.19 cm  LV major s, A4C: 6.97 cm  LV vol d, MOD A2C: 111.0 ml  LV vol d, MOD A4C: 139.0 ml  LV vol s, MOD A2C: 39.0 ml  LV vol s, MOD A4C: 51.1 ml  LV SV MOD A2C: 72.0 ml  LV SV MOD A4C: 139.0 ml  LV SV MOD BP: 83.5 ml    RIGHT VENTRICLE  RV S prime: 11.70 cm/s  TAPSE (M-mode): 2.1 cm    LEFT ATRIUM Index RIGHT ATRIUM Index  LA diam: 3.90 cm 1.48 cm/m RA Area: 15.00 cm  LA Vol (A2C): 46.7 ml 17.70 ml/m RA Volume: 37.10 ml 14.06 ml/m  LA Vol (A4C): 43.8 ml 16.60 ml/m  LA Biplane Vol: 48.0 ml 18.19 ml/m  AORTIC VALVE  AV Area (Vmax): 0.88 cm  AV Area (Vmean): 0.96 cm  AV Area (VTI): 1.03 cm  AV Vmax: 384.14 cm/s  AV Vmean: 252.881 cm/s  AV VTI: 0.797 m  AV Peak Grad: 59.0 mmHg  AV Mean Grad: 30.3 mmHg  LVOT Vmax: 78.30 cm/s  LVOT Vmean: 56.200 cm/s  LVOT VTI: 0.191 m  LVOT/AV VTI ratio: 0.24    AORTA  Ao Root diam: 3.10 cm    MITRAL VALVE TRICUSPID VALVE  MV Area (PHT): 2.80 cm TR Peak grad: 22.1 mmHg  MV PHT: 78.59 msec TR Vmax: 235.00 cm/s  MV Decel Time: 271 msec  MV E velocity: 68.60 cm/s 103 cm/s SHUNTS  MV  A velocity: 58.30 cm/s 70.3 cm/s Systemic VTI: 0.19 m  MV E/A ratio: 1.18 1.5 Systemic Diam: 2.34 cm    Recent Labs: 08/28/2019: B Natriuretic Peptide 51.0; TSH 7.935 12/20/2019: ALT 27; BUN 7; Creatinine, Ser 1.03; Hemoglobin 14.3; Platelets 220; Potassium 3.9; Sodium 138    Wt Readings from Last 3 Encounters:  12/20/19 (!) 312 lb (141.5 kg)  09/20/19 (!) 317 lb 1.9 oz (143.8 kg)  09/09/19 (!) 312 lb (141.5 kg)     Other studies Reviewed: Additional studies/ records that were reviewed today include: echo images,hosptial records Review of the above records demonstrates:   Assessment and Plan:   1. Severe Aortic Valve Stenosis: *** plan from here *** 1. Severe Aortic Valve Stenosis: He has moderate to severe aortic stenosis. I have personally reviewed the echo images. The aortic valve is thickened, calcified but the leaflets open. No indication at this time to proceed with planning for AVR. I have spent 45 minutes today reviewing his aortic valve pathology and possible treatments including surgical AVR and TAVR. Will follow with repeat echo in 6 months. He will call with a change in his clinical status.    Current medicines are reviewed at length with the patient today.  The patient does not have concerns regarding medicines.  The following changes have been made:  no change  Labs/ tests ordered today include:   No orders of the defined types were placed in this encounter.    Disposition:   FU with me in 6 months.    Signed, Lauree Chandler, MD 03/13/2020 6:48 AM    [facility] [address], [place], [place]  [address] Phone: [phone]; Fax: [phone]

## 2020-04-06 ENCOUNTER — Other Ambulatory Visit (HOSPITAL_COMMUNITY): Payer: No Typology Code available for payment source

## 2020-04-06 ENCOUNTER — Encounter (HOSPITAL_COMMUNITY): Payer: Self-pay

## 2020-04-06 ENCOUNTER — Encounter (HOSPITAL_COMMUNITY): Payer: Self-pay | Admitting: Cardiovascular Disease

## 2020-04-16 ENCOUNTER — Telehealth (HOSPITAL_COMMUNITY): Payer: Self-pay | Admitting: Cardiovascular Disease

## 2020-04-16 NOTE — Telephone Encounter (Signed)
Just an FYI. We have made several attempts to contact this patient including sending a letter to schedule or reschedule their echocardiogram. We will be removing the patient from the echo WQ.  04/06/20 MAILED LETTER [facility]  Pt cancelled 03/13/20 and No Show on 04/06/2020  11/18/19 [facility] to schedule Echo in July 2021 @ 11:55/[facility]  6 mth f/u echo due in July 2021/sf      Thank you

## 2020-04-16 NOTE — Telephone Encounter (Signed)
Will route to primary RN to make aware.

## 2020-04-17 NOTE — Telephone Encounter (Signed)
Johnathan Richmond was seen in structural clinic in Jan 2021 for aortic stenosis, 38mo f/u was recommended.

## 2020-06-12 ENCOUNTER — Other Ambulatory Visit: Payer: Self-pay

## 2020-06-12 ENCOUNTER — Encounter (HOSPITAL_COMMUNITY): Payer: Self-pay | Admitting: Emergency Medicine

## 2020-06-12 ENCOUNTER — Ambulatory Visit
Admission: EM | Admit: 2020-06-12 | Discharge: 2020-06-12 | Disposition: A | Discharge status: Home / Self Care | Payer: No Typology Code available for payment source | Attending: Emergency Medicine | Admitting: Emergency Medicine

## 2020-06-12 ENCOUNTER — Encounter: Payer: Self-pay | Admitting: Emergency Medicine

## 2020-06-12 DIAGNOSIS — E039 Hypothyroidism, unspecified: Secondary | ICD-10-CM | POA: Insufficient documentation

## 2020-06-12 DIAGNOSIS — I10 Essential (primary) hypertension: Secondary | ICD-10-CM | POA: Insufficient documentation

## 2020-06-12 DIAGNOSIS — E1165 Type 2 diabetes mellitus with hyperglycemia: Secondary | ICD-10-CM | POA: Diagnosis present

## 2020-06-12 DIAGNOSIS — R42 Dizziness and giddiness: Secondary | ICD-10-CM | POA: Diagnosis not present

## 2020-06-12 LAB — BASIC METABOLIC PANEL
Anion gap: 12 (ref 5–15)
BUN: 14 mg/dL (ref 6–20)
CO2: 25 mmol/L (ref 22–32)
Calcium: 9.1 mg/dL (ref 8.9–10.3)
Chloride: 96 mmol/L — ABNORMAL LOW (ref 98–111)
Creatinine, Ser: 1.07 mg/dL (ref 0.61–1.24)
GFR, Estimated: >60 mL/min (ref >60)
Glucose, Bld: 401 mg/dL — ABNORMAL HIGH (ref 70–99)
Potassium: 4.1 mmol/L (ref 3.5–5.1)
Sodium: 133 mmol/L — ABNORMAL LOW (ref 135–145)

## 2020-06-12 LAB — CBC
HCT: 48.1 % (ref 39.0–52.0)
Hemoglobin: 16.3 g/dL (ref 13.0–17.0)
MCH: 28.2 pg (ref 26.0–34.0)
MCHC: 33.9 g/dL (ref 30.0–36.0)
MCV: 83.1 fL (ref 80.0–100.0)
Platelets: 273 10*3/uL (ref 150–400)
RBC: 5.79 MIL/uL (ref 4.22–5.81)
RDW: 13.5 % (ref 11.5–15.5)
WBC: 5.3 10*3/uL (ref 4.0–10.5)
nRBC: 0 % (ref 0.0–0.2)

## 2020-06-12 LAB — POCT FASTING CBG KUC MANUAL ENTRY: POCT Glucose (KUC): 389 mg/dL — AB (ref 70–99)

## 2020-06-12 LAB — CBG MONITORING, ED: Glucose-Capillary: 380 mg/dL — ABNORMAL HIGH (ref 70–99)

## 2020-06-12 NOTE — ED Triage Notes (Addendum)
Patient was seen at the Urgent Care today for hyperglycemia. Patient has type 2 diabetes. Patient normally takes metformin but has been without due to insurance issues. Cbg in triage is 380. Patient states frequent urination and dizziness.

## 2020-06-12 NOTE — ED Triage Notes (Signed)
Pt has been having fatigue, dizziness, increased urinary frequency and increased thurst x 2 weeks.  Pt dx with type 2 DM this year. States he has not been taking his metformin d/t he did not have any insurance.  Pt states he just got a new job and his insurance will start next week.   Informed that metformin is on the [facility] $4 list.

## 2020-06-12 NOTE — ED Notes (Signed)
Patient is being discharged from the Urgent Care and sent to the Emergency Department via pov . Per K.Avegno, patient is in need of higher level of care due to dizziness/ hypergylcemia. Patient is aware and verbalizes understanding of plan of care.  Vitals:   06/12/20 1705  BP: 135/88  Pulse: 83  Resp: 18  Temp: 98.4 F (36.9 C)  SpO2: 98%

## 2020-06-13 ENCOUNTER — Emergency Department (HOSPITAL_COMMUNITY)
Admission: EM | Admit: 2020-06-13 | Discharge: 2020-06-13 | Disposition: A | Discharge status: Home / Self Care | Payer: No Typology Code available for payment source | Attending: Emergency Medicine | Admitting: Emergency Medicine

## 2020-06-13 DIAGNOSIS — R739 Hyperglycemia, unspecified: Secondary | ICD-10-CM

## 2020-06-13 LAB — CBG MONITORING, ED: Glucose-Capillary: 578 mg/dL (ref 70–99)

## 2020-06-13 MED ORDER — INSULIN ASPART 100 UNIT/ML ~~LOC~~ SOLN
3.0000 [IU] | Freq: Once | SUBCUTANEOUS | Status: AC
  Administered 2020-06-13: 3 [IU] via SUBCUTANEOUS
  Filled 2020-06-13: qty 1

## 2020-06-13 MED ORDER — INSULIN ASPART 100 UNIT/ML ~~LOC~~ SOLN: 2.0000 [IU] | Freq: Once | SUBCUTANEOUS | Status: DC

## 2020-06-13 MED ORDER — METFORMIN HCL 500 MG PO TABS: 500.0000 mg | ORAL_TABLET | Freq: Two times a day (BID) | ORAL | 1 refills | Status: DC

## 2020-06-13 NOTE — ED Provider Notes (Signed)
[facility]-EMERGENCY DEPT [facility] Emergency Department Provider Note MRN:  [id number]  Arrival date & time: 06/13/20     Chief Complaint   Hyperglycemia   History of Present Illness   Johnathan Richmond is a 59 y.o. year-old male with a history of diabetes presenting to the ED with chief complaint of hyperglycemia.  Patient has been having a lot of frequent urination and drinking a lot of water, feeling lightheaded and dehydrated.  Has been out of his Metformin for a while due to losing his insurance.  Blood sugar has been running high.  Denies fever, no cough, no other complaints.  No chest pain, no shortness of breath, no burning with urination.  Review of Systems  A problem-focused ROS was performed. Positive for polyuria, polydipsia.  Patient denies fever.  Patient's Health History    Past Medical History:  Diagnosis Date  . Aortic stenosis   . Diabetes mellitus ([facility])   . Essential hypertension   . Hypothyroidism   . Morbid obesity ([facility])   . Paroxysmal atrial flutter [facility])     Past Surgical History:  Procedure Laterality Date  . CHOLECYSTECTOMY      Family History  Problem Relation Age of Onset  . Schizophrenia Mother   . COPD Mother   . Epilepsy Father   . Schizophrenia Brother     Social History   Socioeconomic History  . Marital status: Divorced    Spouse name: Not on file  . Number of children: 0  . Years of education: Not on file  . Highest education level: Not on file  Occupational History  . Occupation: Works at [employer]  . Smoking status: Never Smoker  . Smokeless tobacco: Never Used  Vaping Use  . Vaping Use: Never used  Substance and Sexual Activity  . Alcohol use: No  . Drug use: No  . Sexual activity: Not on file  Other Topics Concern  . Not on file  Social History Narrative  . Not on file   Social Determinants of Health   Financial Resource Strain:   . Difficulty of Paying Living Expenses: Not  on file  Food Insecurity:   . Worried About Programme researcher, broadcasting/film/video in the Last Year: Not on file  . Ran Out of Food in the Last Year: Not on file  Transportation Needs:   . Lack of Transportation (Medical): Not on file  . Lack of Transportation (Non-Medical): Not on file  Physical Activity:   . Days of Exercise per Week: Not on file  . Minutes of Exercise per Session: Not on file  Stress:   . Feeling of Stress : Not on file  Social Connections:   . Frequency of Communication with Friends and Family: Not on file  . Frequency of Social Gatherings with Friends and Family: Not on file  . Attends Religious Services: Not on file  . Active Member of Clubs or Organizations: Not on file  . Attends Banker Meetings: Not on file  . Marital Status: Not on file  Intimate Partner Violence:   . Fear of Current or Ex-Partner: Not on file  . Emotionally Abused: Not on file  . Physically Abused: Not on file  . Sexually Abused: Not on file     Physical Exam   Vitals:   06/12/20 1906  BP: (!) 132/106  Pulse: 93  Resp: 18  Temp: 98.4 F (36.9 C)  SpO2: 100%    CONSTITUTIONAL: Well-appearing,  NAD NEURO:  Alert and oriented x 3, no focal deficits EYES:  eyes equal and reactive ENT/NECK:  no LAD, no JVD CARDIO: Regular rate, well-perfused, normal S1 and S2 PULM:  CTAB no wheezing or rhonchi GI/GU:  normal bowel sounds, non-distended, non-tender MSK/SPINE:  No gross deformities, no edema SKIN:  no rash, atraumatic PSYCH:  Appropriate speech and behavior  *Additional and/or pertinent findings included in MDM below  Diagnostic and Interventional Summary    EKG Interpretation  Date/Time:    Ventricular Rate:    PR Interval:    QRS Duration:   QT Interval:    QTC Calculation:   R Axis:     Text Interpretation:        Labs Reviewed  BASIC METABOLIC PANEL - Abnormal; Notable for the following components:      Result Value   Sodium 133 (*)    Chloride 96 (*)     Glucose, Bld 401 (*)    All other components within normal limits  CBG MONITORING, ED - Abnormal; Notable for the following components:   Glucose-Capillary 380 (*)    All other components within normal limits  CBG MONITORING, ED - Abnormal; Notable for the following components:   Glucose-Capillary 578 (*)    All other components within normal limits  CBC  URINALYSIS, ROUTINE W REFLEX MICROSCOPIC    No orders to display    Medications  insulin aspart (novoLOG) injection 3 Units (has no administration in time range)     Procedures  /  Critical Care Procedures  ED Course and Medical Decision Making  I have reviewed the triage vital signs, the nursing notes, and pertinent available records from the EMR.  Listed above are laboratory and imaging tests that I personally ordered, reviewed, and interpreted and then considered in my medical decision making (see below for details).  Hyperglycemia without evidence of DKA, providing small dose of subcutaneous insulin here in the emergency department, will refill Metformin, appropriate for discharge.       Elmer Sow. Pilar Plate, MD [facility] Emergency Medicine [facility] [email or web address]  Final Clinical Impressions(s) / ED Diagnoses     ICD-10-CM   1. Hyperglycemia  R73.9     ED Discharge Orders         Ordered    metFORMIN (GLUCOPHAGE) 500 MG tablet  2 times daily with meals        06/13/20 0050           Discharge Instructions Discussed with and Provided to Patient:     Discharge Instructions     You were evaluated in the Emergency Department and after careful evaluation, we did not find any emergent condition requiring admission or further testing in the hospital.  Your exam/testing today was overall reassuring.  As discussed, continue working on removing carbs and sugars from your diet and take the Metformin medication as directed.  It will be important you follow-up with your primary care doctor who can  follow your diabetes.  Please return to the Emergency Department if you experience any worsening of your condition.  Thank you for allowing [place] to be a part of your care.       Sabas Sous, MD 06/13/20 [phone]

## 2020-06-13 NOTE — Discharge Instructions (Addendum)
You were evaluated in the Emergency Department and after careful evaluation, we did not find any emergent condition requiring admission or further testing in the hospital.  Your exam/testing today was overall reassuring.  As discussed, continue working on removing carbs and sugars from your diet and take the Metformin medication as directed.  It will be important you follow-up with your primary care doctor who can follow your diabetes.  Please return to the Emergency Department if you experience any worsening of your condition.  Thank you for allowing [place] to be a part of your care.

## 2020-06-18 ENCOUNTER — Telehealth: Payer: Self-pay

## 2020-06-18 NOTE — Telephone Encounter (Signed)
Reviewed chart and pt has no showed for Echocardiogram and appointment with Dr Clifton James on 7/9 and 8/2.  Since the pt's evaluation in January 2021 with Dr Clifton James the pt has been seen in the ER and Urgent Care for dizziness and lightheadedness.  I have attempted to reach the pt previously and left messages for the pt to schedule an echocardiogram and OV with Dr Clifton James but have not received a return call.  I have left another voicemail today for the pt to contact the office to arrange follow-up.

## 2020-06-26 ENCOUNTER — Other Ambulatory Visit: Payer: Self-pay

## 2020-06-26 ENCOUNTER — Ambulatory Visit
Admission: EM | Admit: 2020-06-26 | Discharge: 2020-06-26 | Disposition: A | Discharge status: Home / Self Care | Payer: No Typology Code available for payment source | Attending: Emergency Medicine | Admitting: Emergency Medicine

## 2020-06-26 DIAGNOSIS — R739 Hyperglycemia, unspecified: Secondary | ICD-10-CM

## 2020-06-26 LAB — POCT FASTING CBG KUC MANUAL ENTRY: POCT Glucose (KUC): 463 mg/dL — AB (ref 70–99)

## 2020-06-26 MED ORDER — INSULIN ASPART 100 UNIT/ML ~~LOC~~ SOLN
10.0000 [IU] | Freq: Once | SUBCUTANEOUS | Status: AC
  Administered 2020-06-26: 10 [IU] via SUBCUTANEOUS

## 2020-06-26 NOTE — ED Provider Notes (Signed)
[facility]   [id number] 06/26/20 Arrival Time: 1747   Chief Complaint  Patient presents with  . Dizziness     SUBJECTIVE: History from: patient.  Johnathan Richmond is a 59 y.o. male who presented to the urgent care with a complaint of lightheadedness that started last night.  Was sent home from work.  Was recently started on Metformin for diabetes.  supposed to take Metformin 500 mg twice a day.  So far today he has taken only 1 tablet.  Reports similar symptoms in the past and was seen in the emergency department.  Insulin was given and patient was discharged with Metformin.  Denies fever, cough, chest pain, shortness of breath, dysuria.  ROS: As per HPI.  All other pertinent ROS negative.     Past Medical History:  Diagnosis Date  . Aortic stenosis   . Diabetes mellitus (HCC)   . Essential hypertension   . Hypothyroidism   . Morbid obesity (HCC)   . Paroxysmal atrial flutter [facility]    Past Surgical History:  Procedure Laterality Date  . CHOLECYSTECTOMY     No Known Allergies No current facility-administered medications on file prior to encounter.   Current Outpatient Medications on File Prior to Encounter  Medication Sig Dispense Refill  . apixaban (ELIQUIS) 5 MG TABS tablet Take 1 tablet (5 mg total) by mouth 2 (two) times daily. 60 tablet 6  . diltiazem (CARDIZEM CD) 120 MG 24 hr capsule Take 1 capsule (120 mg total) by mouth daily. 30 capsule 6  . metFORMIN (GLUCOPHAGE) 500 MG tablet Take 1 tablet (500 mg total) by mouth 2 (two) times daily with a meal. 60 tablet 1  . pantoprazole (PROTONIX) 40 MG tablet Take 1 tablet (40 mg total) by mouth daily. 30 tablet 1   Social History   Socioeconomic History  . Marital status: Divorced    Spouse name: Not on file  . Number of children: 0  . Years of education: Not on file  . Highest education level: Not on file  Occupational History  . Occupation: Works at [employer]  .  Smoking status: Never Smoker  . Smokeless tobacco: Never Used  Vaping Use  . Vaping Use: Never used  Substance and Sexual Activity  . Alcohol use: No  . Drug use: No  . Sexual activity: Not on file  Other Topics Concern  . Not on file  Social History Narrative  . Not on file   Social Determinants of Health   Financial Resource Strain:   . Difficulty of Paying Living Expenses: Not on file  Food Insecurity:   . Worried About Programme researcher, broadcasting/film/video in the Last Year: Not on file  . Ran Out of Food in the Last Year: Not on file  Transportation Needs:   . Lack of Transportation (Medical): Not on file  . Lack of Transportation (Non-Medical): Not on file  Physical Activity:   . Days of Exercise per Week: Not on file  . Minutes of Exercise per Session: Not on file  Stress:   . Feeling of Stress : Not on file  Social Connections:   . Frequency of Communication with Friends and Family: Not on file  . Frequency of Social Gatherings with Friends and Family: Not on file  . Attends Religious Services: Not on file  . Active Member of Clubs or Organizations: Not on file  . Attends Banker Meetings: Not on file  . Marital  Status: Not on file  Intimate Partner Violence:   . Fear of Current or Ex-Partner: Not on file  . Emotionally Abused: Not on file  . Physically Abused: Not on file  . Sexually Abused: Not on file   Family History  Problem Relation Age of Onset  . Schizophrenia Mother   . COPD Mother   . Epilepsy Father   . Schizophrenia Brother     OBJECTIVE:  Vitals:   06/26/20 1807  BP: 129/85  Pulse: 97  Resp: 20  Temp: 97.6 F (36.4 C)  SpO2: 95%     General appearance: alert; appears fatigued, but nontoxic; speaking in full sentences and tolerating own secretions HEENT: NCAT; Ears: EACs clear, TMs pearly gray; Eyes: PERRL.  EOM grossly intact. Sinuses: nontender; Nose: nares patent without rhinorrhea, Throat: oropharynx clear, tonsils non erythematous or  enlarged, uvula midline  Neck: supple without LAD Lungs: unlabored respirations, symmetrical air entry; no respiratory distress; CTAB Heart: regular rate and rhythm.  Radial pulses 2+ symmetrical bilaterally Skin: warm and dry Psychological: alert and cooperative; normal mood and affect  LABS:  Results for orders placed or performed during the hospital encounter of 06/26/20 (from the past 24 hour(s))  POCT CBG (manual entry)     Status: Abnormal   Collection Time: 06/26/20  6:18 PM  Result Value Ref Range   POCT Glucose ([facility]) 463 (A) 70 - 99 mg/dL     ASSESSMENT & PLAN:  1. Hyperglycemia     No orders of the defined types were placed in this encounter.   Discharge instructions  POC CBGs in office was 463 NovoLog  was given in office Was advised to continue to take Metformin as prescribed Advised to eat a well balanced diet, low-carb rich in vegetable Advised for physical activity daily Follow-up and establish care with a primary care provider Return or go to ED for worsening of symptoms  Reviewed expectations re: course of current medical issues. Questions answered. Outlined signs and symptoms indicating need for more acute intervention. Patient verbalized understanding. After Visit Summary given.         Durward Parcel, FNP 06/26/20 [phone]

## 2020-06-26 NOTE — Discharge Instructions (Addendum)
POC CBGs in office was 463 NovoLog  was given in office Was advised to continue to take Metformin as prescribed Advised to eat a well balanced diet, low-carb rich in vegetable Advised for physical activity daily Follow-up plan establish care with a primary care provider Return or go to ED for worsening of symptoms

## 2020-06-26 NOTE — ED Triage Notes (Signed)
Pt presents with c/o being light headed last night at work and was sent home from work, pt recently started on metformin with new diagnosis of diabetes

## 2020-08-23 ENCOUNTER — Ambulatory Visit
Admission: EM | Admit: 2020-08-23 | Discharge: 2020-08-23 | Disposition: A | Discharge status: Home / Self Care | Payer: Self-pay | Attending: Emergency Medicine | Admitting: Emergency Medicine

## 2020-08-23 ENCOUNTER — Encounter: Payer: Self-pay | Admitting: Emergency Medicine

## 2020-08-23 ENCOUNTER — Other Ambulatory Visit: Payer: Self-pay

## 2020-08-23 DIAGNOSIS — Z760 Encounter for issue of repeat prescription: Secondary | ICD-10-CM

## 2020-08-23 MED ORDER — METFORMIN HCL 500 MG PO TABS: 500.0000 mg | ORAL_TABLET | Freq: Two times a day (BID) | ORAL | 1 refills | Status: DC

## 2020-08-23 NOTE — ED Triage Notes (Signed)
Needs refill on metformin refilled

## 2020-08-23 NOTE — Discharge Instructions (Addendum)
Metformin was refilled/take as directed Follow-up and establish care with PCP PCP resource was provided Return or go to ED if you develop any new or worsening of your

## 2020-08-23 NOTE — ED Provider Notes (Signed)
[facility]   [id number] 08/23/20 Arrival Time: 1145  Chief Complaint  Patient presents with  . Medication Refill     SUBJECTIVE: History from: patient and family.  Johnathan Richmond is a 59 y.o. male who presented to the urgent care with a complaint of medication refill.  Report he ran out of his Metformin.  Does not have a PCP.  Has been taking Metformin for a few month as he was recently diagnosed with diabetes.  Denies of any aggravating factors.  Denies similar symptoms in the past.  Denies chills, fever, nausea, vomiting, diarrhea.  ROS: As per HPI.  All other pertinent ROS negative.     Past Medical History:  Diagnosis Date  . Aortic stenosis   . Diabetes mellitus (HCC)   . Essential hypertension   . Hypothyroidism   . Morbid obesity (HCC)   . Paroxysmal atrial flutter [facility])    Past Surgical History:  Procedure Laterality Date  . CHOLECYSTECTOMY     No Known Allergies No current facility-administered medications on file prior to encounter.   Current Outpatient Medications on File Prior to Encounter  Medication Sig Dispense Refill  . apixaban (ELIQUIS) 5 MG TABS tablet Take 1 tablet (5 mg total) by mouth 2 (two) times daily. 60 tablet 6  . diltiazem (CARDIZEM CD) 120 MG 24 hr capsule Take 1 capsule (120 mg total) by mouth daily. 30 capsule 6  . pantoprazole (PROTONIX) 40 MG tablet Take 1 tablet (40 mg total) by mouth daily. 30 tablet 1   Social History   Socioeconomic History  . Marital status: Divorced    Spouse name: Not on file  . Number of children: 0  . Years of education: Not on file  . Highest education level: Not on file  Occupational History  . Occupation: Works at [employer]  . Smoking status: Never Smoker  . Smokeless tobacco: Never Used  Vaping Use  . Vaping Use: Never used  Substance and Sexual Activity  . Alcohol use: No  . Drug use: No  . Sexual activity: Not on file  Other Topics Concern   . Not on file  Social History Narrative  . Not on file   Social Determinants of Health   Financial Resource Strain: Not on file  Food Insecurity: Not on file  Transportation Needs: Not on file  Physical Activity: Not on file  Stress: Not on file  Social Connections: Not on file  Intimate Partner Violence: Not on file   Family History  Problem Relation Age of Onset  . Schizophrenia Mother   . COPD Mother   . Epilepsy Father   . Schizophrenia Brother     OBJECTIVE:  Vitals:   08/23/20 1215 08/23/20 1217  BP:  (!) 150/84  Pulse:  90  Resp:  19  Temp:  98.4 F (36.9 C)  TempSrc:  Oral  SpO2:  96%  Weight: 265 lb (120.2 kg)   Height: 6\' 2"  (1.88 m)      Physical Exam Vitals and nursing note reviewed.  Constitutional:      General: He is not in acute distress.    Appearance: Normal appearance. He is normal weight. He is not ill-appearing, toxic-appearing or diaphoretic.  Cardiovascular:     Rate and Rhythm: Normal rate and regular rhythm.     Pulses: Normal pulses.     Heart sounds: Normal heart sounds. No murmur heard. No friction rub. No gallop.   Pulmonary:  Effort: Pulmonary effort is normal. No respiratory distress.     Breath sounds: Normal breath sounds. No stridor. No wheezing, rhonchi or rales.  Chest:     Chest wall: No tenderness.  Neurological:     Mental Status: He is alert and oriented to person, place, and time.     LABS:  No results found for this or any previous visit (from the past 24 hour(s)).   ASSESSMENT & PLAN:  1. Medication refill     Meds ordered this encounter  Medications  . metFORMIN (GLUCOPHAGE) 500 MG tablet    Sig: Take 1 tablet (500 mg total) by mouth 2 (two) times daily with a meal.    Dispense:  120 tablet    Refill:  1    Discharge Instructions  Metformin was refilled/take as directed Follow-up and establish care with PCP PCP resource was provided Return or go to ED if you develop any new or worsening of  your symptoms  Reviewed expectations re: course of current medical issues. Questions answered. Outlined signs and symptoms indicating need for more acute intervention. Patient verbalized understanding. After Visit Summary given.         Durward Parcel, FNP 08/23/20 1246

## 2020-10-07 ENCOUNTER — Other Ambulatory Visit: Payer: Self-pay

## 2020-10-07 ENCOUNTER — Encounter (HOSPITAL_COMMUNITY): Payer: Self-pay

## 2020-10-07 ENCOUNTER — Emergency Department (HOSPITAL_COMMUNITY): Payer: Self-pay

## 2020-10-07 ENCOUNTER — Emergency Department (HOSPITAL_COMMUNITY)
Admission: EM | Admit: 2020-10-07 | Discharge: 2020-10-08 | Disposition: A | Discharge status: Home / Self Care | Payer: Self-pay | Attending: Emergency Medicine | Admitting: Emergency Medicine

## 2020-10-07 DIAGNOSIS — J069 Acute upper respiratory infection, unspecified: Secondary | ICD-10-CM

## 2020-10-07 DIAGNOSIS — R0981 Nasal congestion: Secondary | ICD-10-CM | POA: Insufficient documentation

## 2020-10-07 DIAGNOSIS — I10 Essential (primary) hypertension: Secondary | ICD-10-CM | POA: Insufficient documentation

## 2020-10-07 DIAGNOSIS — E039 Hypothyroidism, unspecified: Secondary | ICD-10-CM | POA: Insufficient documentation

## 2020-10-07 DIAGNOSIS — E119 Type 2 diabetes mellitus without complications: Secondary | ICD-10-CM | POA: Insufficient documentation

## 2020-10-07 DIAGNOSIS — Z7901 Long term (current) use of anticoagulants: Secondary | ICD-10-CM | POA: Insufficient documentation

## 2020-10-07 DIAGNOSIS — Z7984 Long term (current) use of oral hypoglycemic drugs: Secondary | ICD-10-CM | POA: Insufficient documentation

## 2020-10-07 DIAGNOSIS — R059 Cough, unspecified: Secondary | ICD-10-CM | POA: Insufficient documentation

## 2020-10-07 DIAGNOSIS — R739 Hyperglycemia, unspecified: Secondary | ICD-10-CM

## 2020-10-07 DIAGNOSIS — Z79899 Other long term (current) drug therapy: Secondary | ICD-10-CM | POA: Insufficient documentation

## 2020-10-07 LAB — CBC WITH DIFFERENTIAL/PLATELET
Abs Immature Granulocytes: 0.02 10*3/uL (ref 0.00–0.07)
Basophils Absolute: 0 10*3/uL (ref 0.0–0.1)
Basophils Relative: 0 %
Eosinophils Absolute: 0 10*3/uL (ref 0.0–0.5)
Eosinophils Relative: 1 %
HCT: 46.7 % (ref 39.0–52.0)
Hemoglobin: 15.3 g/dL (ref 13.0–17.0)
Immature Granulocytes: 0 %
Lymphocytes Relative: 43 %
Lymphs Abs: 2.6 10*3/uL (ref 0.7–4.0)
MCH: 27.7 pg (ref 26.0–34.0)
MCHC: 32.8 g/dL (ref 30.0–36.0)
MCV: 84.6 fL (ref 80.0–100.0)
Monocytes Absolute: 0.5 10*3/uL (ref 0.1–1.0)
Monocytes Relative: 9 %
Neutro Abs: 2.9 10*3/uL (ref 1.7–7.7)
Neutrophils Relative %: 47 %
Platelets: 288 10*3/uL (ref 150–400)
RBC: 5.52 MIL/uL (ref 4.22–5.81)
RDW: 12.8 % (ref 11.5–15.5)
WBC: 6.1 10*3/uL (ref 4.0–10.5)
nRBC: 0 % (ref 0.0–0.2)

## 2020-10-07 LAB — CBG MONITORING, ED: Glucose-Capillary: 479 mg/dL — ABNORMAL HIGH (ref 70–99)

## 2020-10-07 MED ORDER — INSULIN ASPART 100 UNIT/ML IV SOLN
10.0000 [IU] | Freq: Once | INTRAVENOUS | Status: AC
  Administered 2020-10-07: 10 [IU] via INTRAVENOUS

## 2020-10-07 MED ORDER — SODIUM CHLORIDE 0.9 % IV BOLUS
1000.0000 mL | Freq: Once | INTRAVENOUS | Status: AC
  Administered 2020-10-07: 1000 mL via INTRAVENOUS

## 2020-10-07 NOTE — ED Triage Notes (Signed)
Pt to er, pt states that last week he started to have flu like symptoms. States that he didn't want anyone at work to get sick so he has been home.  States that he is here to get evaluated to come back to work.

## 2020-10-07 NOTE — ED Provider Notes (Signed)
[facility] EMERGENCY DEPARTMENT Provider Note   CSN: [id number] Arrival date & time: 10/07/20  1549     History Chief Complaint  Patient presents with  . Cough    Johnathan Richmond is a 60 y.o. male.  Patient is a 60 year old male with past medical history of diabetes, hypertension, paroxysmal a flutter.  He presents today for evaluation of cough and congestion.  Symptoms have been present for greater than 1 week.  Patient has been unable to attend work secondary to feeling weak.  He has not sought medical care prior to tonight because of his weakness.  He tells me his symptoms are actually improving and needs a note to return to work.  He denies chest pain or difficulty breathing.  The history is provided by the patient.  Cough Cough characteristics:  Non-productive Severity:  Moderate Onset quality:  Gradual Duration:  1 week Timing:  Constant Progression:  Improving Chronicity:  New Smoker: no   Relieved by:  Nothing Worsened by:  Nothing      Past Medical History:  Diagnosis Date  . Aortic stenosis   . Diabetes mellitus (HCC)   . Essential hypertension   . Hypothyroidism   . Morbid obesity (HCC)   . Paroxysmal atrial flutter [facility])     Patient Active Problem List   Diagnosis Date Noted  . Aortic stenosis 08/29/2019  . History of atrial flutter 08/29/2019  . Subclinical hypothyroidism 08/29/2019  . Lower extremity edema 08/29/2019  . Essential hypertension 08/29/2019  . Type 2 diabetes mellitus without complication, without long-term current use of insulin (HCC) 08/29/2019  . Atypical chest pain 08/28/2019    Past Surgical History:  Procedure Laterality Date  . CHOLECYSTECTOMY         Family History  Problem Relation Age of Onset  . Schizophrenia Mother   . COPD Mother   . Epilepsy Father   . Schizophrenia Brother     Social History   Tobacco Use  . Smoking status: Never Smoker  . Smokeless tobacco: Never Used  Vaping Use  . Vaping Use:  Never used  Substance Use Topics  . Alcohol use: No  . Drug use: No    Home Medications Prior to Admission medications   Medication Sig Start Date End Date Taking? Authorizing Provider  apixaban (ELIQUIS) 5 MG TABS tablet Take 1 tablet (5 mg total) by mouth 2 (two) times daily. 12/20/19   Linwood Dibbles, MD  diltiazem (CARDIZEM CD) 120 MG 24 hr capsule Take 1 capsule (120 mg total) by mouth daily. 12/20/19   Linwood Dibbles, MD  metFORMIN (GLUCOPHAGE) 500 MG tablet Take 1 tablet (500 mg total) by mouth 2 (two) times daily with a meal. 08/23/20   Avegno, Zachery Dakins, FNP  pantoprazole (PROTONIX) 40 MG tablet Take 1 tablet (40 mg total) by mouth daily. 12/20/19   Linwood Dibbles, MD    Allergies    Patient has no known allergies.  Review of Systems   Review of Systems  Respiratory: Positive for cough.   All other systems reviewed and are negative.   Physical Exam Updated Vital Signs BP 125/78 (BP Location: Right Arm)   Pulse 89   Temp 98.2 F (36.8 C) (Oral)   Resp 16   Ht 6\' 2"  (1.88 m)   Wt 111.1 kg   SpO2 96%   BMI 31.46 kg/m   Physical Exam Vitals and nursing note reviewed.  Constitutional:      General: He is not in acute  distress.    Appearance: He is well-developed and well-nourished. He is not diaphoretic.  HENT:     Head: Normocephalic and atraumatic.     Mouth/Throat:     Mouth: Oropharynx is clear and moist.  Cardiovascular:     Rate and Rhythm: Normal rate and regular rhythm.     Heart sounds: No murmur heard. No friction rub.  Pulmonary:     Effort: Pulmonary effort is normal. No respiratory distress.     Breath sounds: Normal breath sounds. No wheezing or rales.  Abdominal:     General: Bowel sounds are normal. There is no distension.     Palpations: Abdomen is soft.     Tenderness: There is no abdominal tenderness.  Musculoskeletal:        General: No edema. Normal range of motion.     Cervical back: Normal range of motion and neck supple.  Skin:    General:  Skin is warm and dry.  Neurological:     Mental Status: He is alert and oriented to person, place, and time.     Coordination: Coordination normal.     ED Results / Procedures / Treatments   Labs (all labs ordered are listed, but only abnormal results are displayed) Labs Reviewed - No data to display  EKG None  Radiology DG Chest Portable 1 View  Result Date: 10/07/2020 CLINICAL DATA:  Cough. EXAM: PORTABLE CHEST 1 VIEW COMPARISON:  Radiograph and CT December 2020 FINDINGS: The cardiomediastinal contours are normal. The lungs are clear. Pulmonary vasculature is normal. No consolidation, pleural effusion, or pneumothorax. No acute osseous abnormalities are seen. IMPRESSION: No acute chest findings. Electronically Signed   By: Narda Rutherford M.D.   On: 10/07/2020 21:28    Procedures Procedures   Medications Ordered in ED Medications - No data to display  ED Course  I have reviewed the triage vital signs and the nursing notes.  Pertinent labs & imaging results that were available during my care of the patient were reviewed by me and considered in my medical decision making (see chart for details).    MDM Rules/Calculators/A&P  Patient is a 60 year old male presenting with complaints of weakness, URI symptoms, and elevated blood sugar.  This is been ongoing for the past week.  Patient has been unable to attend work secondary to the above symptoms.  He states he is actually feeling better and felt well enough to make it to the ER today, whereas last week he could not because he felt poorly.  Patient's physical examination is unremarkable.  He does have a fingerstick glucose of nearly 500.  This was confirmed by his basic metabolic panel.  Patient was hydrated with normal saline and given intravenous insulin with some improvement in his blood sugar.  At this point, I feel as though he is appropriate for discharge.  Patient may have had Covid, however his symptoms are improving and  he is now beyond the quarantine period.  I do not feel as though testing is necessary.  He will be discharged with close monitoring of his blood sugars and follow-up with a primary physician.  Final Clinical Impression(s) / ED Diagnoses Final diagnoses:  None    Rx / DC Orders ED Discharge Orders    None       Geoffery Lyons, MD 10/08/20 [phone]

## 2020-10-08 LAB — BASIC METABOLIC PANEL
Anion gap: 11 (ref 5–15)
BUN: 11 mg/dL (ref 8–23)
CO2: 27 mmol/L (ref 22–32)
Calcium: 9 mg/dL (ref 8.9–10.3)
Chloride: 92 mmol/L — ABNORMAL LOW (ref 98–111)
Creatinine, Ser: 1.18 mg/dL (ref 0.61–1.24)
GFR, Estimated: >60 mL/min (ref >60)
Glucose, Bld: 510 mg/dL (ref 70–99)
Potassium: 3.7 mmol/L (ref 3.5–5.1)
Sodium: 130 mmol/L — ABNORMAL LOW (ref 135–145)

## 2020-10-08 LAB — CBG MONITORING, ED: Glucose-Capillary: 401 mg/dL — ABNORMAL HIGH (ref 70–99)

## 2020-10-08 NOTE — Discharge Instructions (Signed)
Keep a close eye on your blood sugars for the next week.  Keep a record of these sugars and take this with you to your next doctor's appointment for review.  Return to the emergency department if you develop severe chest pain, difficulty breathing, or other new and concerning symptoms.

## 2020-10-08 NOTE — ED Notes (Signed)
Date and time results received: 10/08/20 0040 (use smartphrase ".now" to insert current time)  Test: glucose Critical Value: 510  Name of Provider Notified: Dr Judd Lien  Orders Received? Or Actions Taken?: Actions Taken: no orders received

## 2020-11-05 ENCOUNTER — Ambulatory Visit
Admission: EM | Admit: 2020-11-05 | Discharge: 2020-11-05 | Disposition: A | Discharge status: Home / Self Care | Payer: 59 | Attending: Emergency Medicine | Admitting: Emergency Medicine

## 2020-11-05 ENCOUNTER — Encounter: Payer: Self-pay | Admitting: Emergency Medicine

## 2020-11-05 ENCOUNTER — Other Ambulatory Visit: Payer: Self-pay

## 2020-11-05 DIAGNOSIS — R42 Dizziness and giddiness: Secondary | ICD-10-CM | POA: Diagnosis not present

## 2020-11-05 DIAGNOSIS — R739 Hyperglycemia, unspecified: Secondary | ICD-10-CM

## 2020-11-05 LAB — POCT FASTING CBG KUC MANUAL ENTRY: POCT Glucose (KUC): 351 mg/dL — AB (ref 70–99)

## 2020-11-05 NOTE — Discharge Instructions (Signed)
Follow-up and establish care with PCP PCP resource was given Take Metformin as prescribed and directed Return or go to ED if you develop any new or worsening of your symptoms

## 2020-11-05 NOTE — ED Triage Notes (Addendum)
Dizziness and blurred vision that started last night.  Pt reports this usually happens when his blood sugar is high.  Pt ambulatory, a&o x 4.  Pt missed last night and today. Pt has not taken his metformin today yet.

## 2020-11-05 NOTE — ED Provider Notes (Signed)
[facility]ARE CENTER   [id number] 11/05/20 Arrival Time: 1615   Chief Complaint  Patient presents with  . Dizziness    SUBJECTIVE: History from: patient.  Johnathan MICHALEC is a 60 y.o. male history of diabetes presented to the urgent care with a complaint of dizziness and blurry vision that occurred last night.  States he had the symptom if his blood sugar is up.  Describes dizziness as room spinning.  States it lasts few seconds.  Denies alleviating or aggravating factors.  Report previous symptoms in the past. Denies fever, chills, fatigue, sinus pain, rhinorrhea, sore throat, SOB, wheezing, chest pain, nausea, changes in bowel or bladder habits.    He states he has missed work yesterday and today and would like to have a work note  ROS: As per HPI.  All other pertinent ROS negative.      Past Medical History:  Diagnosis Date  . Aortic stenosis   . Diabetes mellitus (HCC)   . Essential hypertension   . Hypothyroidism   . Morbid obesity (HCC)   . Paroxysmal atrial flutter [facility])    Past Surgical History:  Procedure Laterality Date  . CHOLECYSTECTOMY     No Known Allergies No current facility-administered medications on file prior to encounter.   Current Outpatient Medications on File Prior to Encounter  Medication Sig Dispense Refill  . apixaban (ELIQUIS) 5 MG TABS tablet Take 1 tablet (5 mg total) by mouth 2 (two) times daily. 60 tablet 6  . diltiazem (CARDIZEM CD) 120 MG 24 hr capsule Take 1 capsule (120 mg total) by mouth daily. 30 capsule 6  . metFORMIN (GLUCOPHAGE) 500 MG tablet Take 1 tablet (500 mg total) by mouth 2 (two) times daily with a meal. 120 tablet 1  . pantoprazole (PROTONIX) 40 MG tablet Take 1 tablet (40 mg total) by mouth daily. 30 tablet 1   Social History   Socioeconomic History  . Marital status: Divorced    Spouse name: Not on file  . Number of children: 0  . Years of education: Not on file  . Highest education level: Not on file   Occupational History  . Occupation: Works at [employer]  . Smoking status: Never Smoker  . Smokeless tobacco: Never Used  Vaping Use  . Vaping Use: Never used  Substance and Sexual Activity  . Alcohol use: No  . Drug use: No  . Sexual activity: Not on file  Other Topics Concern  . Not on file  Social History Narrative  . Not on file   Social Determinants of Health   Financial Resource Strain: Not on file  Food Insecurity: Not on file  Transportation Needs: Not on file  Physical Activity: Not on file  Stress: Not on file  Social Connections: Not on file  Intimate Partner Violence: Not on file   Family History  Problem Relation Age of Onset  . Schizophrenia Mother   . COPD Mother   . Epilepsy Father   . Schizophrenia Brother     OBJECTIVE:  Vitals:   11/05/20 1648  BP: 115/75  Pulse: 89  Resp: 19  Temp: 98.1 F (36.7 C)  TempSrc: Oral  SpO2: 96%     Physical Exam Vitals and nursing note reviewed.  Constitutional:      General: He is not in acute distress.    Appearance: Normal appearance. He is normal weight. He is not ill-appearing, toxic-appearing or diaphoretic.  Eyes:  General: Lids are normal. Lids are everted, no foreign bodies appreciated. Vision grossly intact. Gaze aligned appropriately. No visual field deficit. Cardiovascular:     Rate and Rhythm: Normal rate and regular rhythm.     Pulses: Normal pulses.     Heart sounds: Normal heart sounds. No murmur heard. No friction rub. No gallop.   Pulmonary:     Effort: Pulmonary effort is normal. No respiratory distress.     Breath sounds: Normal breath sounds. No stridor. No wheezing, rhonchi or rales.  Chest:     Chest wall: No tenderness.  Neurological:     General: No focal deficit present.     Mental Status: He is alert and oriented to person, place, and time.     GCS: GCS eye subscore is 4. GCS verbal subscore is 5. GCS motor subscore is 6.     Cranial  Nerves: Cranial nerves are intact.     Sensory: Sensation is intact.     Motor: Motor function is intact.     Coordination: Coordination is intact.     Gait: Gait is intact.      LABS:  Results for orders placed or performed during the hospital encounter of 11/05/20 (from the past 24 hour(s))  POCT CBG (manual entry)     Status: Abnormal   Collection Time: 11/05/20  4:53 PM  Result Value Ref Range   POCT Glucose ([facility]) 351 (A) 70 - 99 mg/dL     ASSESSMENT & PLAN:  1. Hyperglycemia   2. Dizziness     No orders of the defined types were placed in this encounter.  Patient reports he has not taken Metformin today.  He was advised to take his medication as prescribed and directed.  Neuro exam is otherwise normal.  He was advised to follow-up and establish care with PCP.  Discharge instructions  Follow-up and establish care with PCP PCP resource was given Take Metformin as prescribed and directed Return or go to ED if you develop any new or worsening of your symptoms  Reviewed expectations re: course of current medical issues. Questions answered. Outlined signs and symptoms indicating need for more acute intervention. Patient verbalized understanding. After Visit Summary given.         Durward Parcel, FNP 11/05/20 1729

## 2020-11-21 ENCOUNTER — Emergency Department (HOSPITAL_COMMUNITY)
Admission: EM | Admit: 2020-11-21 | Discharge: 2020-11-21 | Disposition: A | Discharge status: Home / Self Care | Payer: 59 | Attending: Emergency Medicine | Admitting: Emergency Medicine

## 2020-11-21 ENCOUNTER — Encounter (HOSPITAL_COMMUNITY): Payer: Self-pay | Admitting: Emergency Medicine

## 2020-11-21 ENCOUNTER — Other Ambulatory Visit: Payer: Self-pay

## 2020-11-21 ENCOUNTER — Emergency Department (HOSPITAL_COMMUNITY): Payer: 59

## 2020-11-21 DIAGNOSIS — Z7901 Long term (current) use of anticoagulants: Secondary | ICD-10-CM | POA: Insufficient documentation

## 2020-11-21 DIAGNOSIS — E039 Hypothyroidism, unspecified: Secondary | ICD-10-CM | POA: Insufficient documentation

## 2020-11-21 DIAGNOSIS — R42 Dizziness and giddiness: Secondary | ICD-10-CM | POA: Diagnosis present

## 2020-11-21 DIAGNOSIS — Z79899 Other long term (current) drug therapy: Secondary | ICD-10-CM | POA: Insufficient documentation

## 2020-11-21 DIAGNOSIS — E1165 Type 2 diabetes mellitus with hyperglycemia: Secondary | ICD-10-CM | POA: Insufficient documentation

## 2020-11-21 DIAGNOSIS — Z7984 Long term (current) use of oral hypoglycemic drugs: Secondary | ICD-10-CM | POA: Insufficient documentation

## 2020-11-21 DIAGNOSIS — I10 Essential (primary) hypertension: Secondary | ICD-10-CM | POA: Diagnosis not present

## 2020-11-21 DIAGNOSIS — R739 Hyperglycemia, unspecified: Secondary | ICD-10-CM

## 2020-11-21 LAB — CBC
HCT: 43.6 % (ref 39.0–52.0)
Hemoglobin: 14.8 g/dL (ref 13.0–17.0)
MCH: 28.2 pg (ref 26.0–34.0)
MCHC: 33.9 g/dL (ref 30.0–36.0)
MCV: 83 fL (ref 80.0–100.0)
Platelets: 227 10*3/uL (ref 150–400)
RBC: 5.25 MIL/uL (ref 4.22–5.81)
RDW: 13.4 % (ref 11.5–15.5)
WBC: 4.3 10*3/uL (ref 4.0–10.5)
nRBC: 0 % (ref 0.0–0.2)

## 2020-11-21 LAB — CBG MONITORING, ED
Glucose-Capillary: 466 mg/dL — ABNORMAL HIGH (ref 70–99)
Glucose-Capillary: 406 mg/dL — ABNORMAL HIGH (ref 70–99)
Glucose-Capillary: 318 mg/dL — ABNORMAL HIGH (ref 70–99)

## 2020-11-21 LAB — URINALYSIS, ROUTINE W REFLEX MICROSCOPIC
Bacteria, UA: NONE SEEN
Bilirubin Urine: NEGATIVE
Glucose, UA: >=500 mg/dL — AB
Hgb urine dipstick: NEGATIVE
Ketones, ur: 5 mg/dL — AB
Leukocytes,Ua: NEGATIVE
Nitrite: NEGATIVE
Protein, ur: NEGATIVE mg/dL
Specific Gravity, Urine: 1.033 — ABNORMAL HIGH (ref 1.005–1.030)
pH: 5 (ref 5.0–8.0)

## 2020-11-21 LAB — BASIC METABOLIC PANEL
Anion gap: 10 (ref 5–15)
BUN: 9 mg/dL (ref 8–23)
CO2: 23 mmol/L (ref 22–32)
Calcium: 8.8 mg/dL — ABNORMAL LOW (ref 8.9–10.3)
Chloride: 97 mmol/L — ABNORMAL LOW (ref 98–111)
Creatinine, Ser: 0.82 mg/dL (ref 0.61–1.24)
GFR, Estimated: >60 mL/min (ref >60)
Glucose, Bld: 480 mg/dL — ABNORMAL HIGH (ref 70–99)
Potassium: 3.5 mmol/L (ref 3.5–5.1)
Sodium: 130 mmol/L — ABNORMAL LOW (ref 135–145)

## 2020-11-21 LAB — TROPONIN I (HIGH SENSITIVITY)
Troponin I (High Sensitivity): 5 ng/L (ref <18)
Troponin I (High Sensitivity): 5 ng/L (ref <18)

## 2020-11-21 MED ORDER — ONDANSETRON HCL 4 MG/2ML IJ SOLN
4.0000 mg | Freq: Once | INTRAMUSCULAR | Status: AC
  Administered 2020-11-21: 4 mg via INTRAVENOUS
  Filled 2020-11-21: qty 2

## 2020-11-21 MED ORDER — INSULIN ASPART 100 UNIT/ML ~~LOC~~ SOLN
10.0000 [IU] | Freq: Once | SUBCUTANEOUS | Status: AC
  Administered 2020-11-21: 10 [IU] via SUBCUTANEOUS
  Filled 2020-11-21: qty 1

## 2020-11-21 MED ORDER — ONDANSETRON 4 MG PO TBDP: 4.0000 mg | ORAL_TABLET | Freq: Three times a day (TID) | ORAL | 0 refills | Status: DC | PRN

## 2020-11-21 MED ORDER — MECLIZINE HCL 25 MG PO TABS: 25.0000 mg | ORAL_TABLET | Freq: Three times a day (TID) | ORAL | 0 refills | Status: DC | PRN

## 2020-11-21 MED ORDER — SODIUM CHLORIDE 0.9 % IV BOLUS
1000.0000 mL | Freq: Once | INTRAVENOUS | Status: AC
  Administered 2020-11-21: 1000 mL via INTRAVENOUS

## 2020-11-21 MED ORDER — MECLIZINE HCL 12.5 MG PO TABS
25.0000 mg | ORAL_TABLET | Freq: Once | ORAL | Status: AC
  Administered 2020-11-21: 25 mg via ORAL
  Filled 2020-11-21: qty 2

## 2020-11-21 MED ORDER — IOHEXOL 350 MG/ML SOLN
100.0000 mL | Freq: Once | INTRAVENOUS | Status: AC | PRN
  Administered 2020-11-21: 100 mL via INTRAVENOUS

## 2020-11-21 MED ORDER — DILTIAZEM HCL ER COATED BEADS 120 MG PO CP24: 120.0000 mg | ORAL_CAPSULE | Freq: Every day | ORAL | 0 refills | Status: DC

## 2020-11-21 MED ORDER — APIXABAN 5 MG PO TABS: 5.0000 mg | ORAL_TABLET | Freq: Two times a day (BID) | ORAL | 0 refills | Status: DC

## 2020-11-21 NOTE — ED Provider Notes (Signed)
Signout from Dr. Manus Gunning. 60 year old male here with dizziness.  Elevated blood sugar.  He is pending CTA head and neck.  Declined transfer for MRI.  Plan is to follow-up on final readings of CTA head and neck.  No significant findings patient can be discharged with follow-up with his PCP. Physical Exam  BP 127/85   Pulse 66   Temp 97.6 F (36.4 C) (Oral)   Resp 15   Ht 6\' 2"  (1.88 m)   Wt 110.2 kg   SpO2 96%   BMI 31.20 kg/m   Physical Exam  ED Course/Procedures     Procedures  MDM  CTA head and neck does not show any acute findings.  We will proceed with discharge per plan from Dr. .    IMPRESSION: 1. No acute intracranial abnormality. Minimal to mild for age nonspecific cerebral white matter changes. 2. CTA is negative for large vessel occlusion, with suboptimal intracranial contrast timing. 3. Mild for age atherosclerosis in the head and neck, predominantly low-density soft plaque at the Right ICA bulb which is not hemodynamically significant. 4. 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   Electronically Signed By: Manus Gunning M.D. On: 11/21/2020 07:28     11/23/2020, MD 11/21/20 [phone]

## 2020-11-21 NOTE — Discharge Instructions (Addendum)
Take your medications as prescribed.  CT scan today does not show any signs of a stroke but you declined transfer for MRI.  Follow-up with your doctor this week as scheduled.  Return to the ED with new or worsening symptoms.

## 2020-11-21 NOTE — ED Notes (Addendum)
Pt to CT

## 2020-11-21 NOTE — ED Provider Notes (Signed)
[facility] EMERGENCY DEPARTMENT Provider Note   CSN: [id number] Arrival date & time: 11/21/20  4709     History Chief Complaint  Patient presents with  . Hyperglycemia    Johnathan Richmond is a 60 y.o. male.  Patient with a history of diabetes, hypertension, aortic stenosis, atrial flutter no longer on anticoagulation presenting with hyperglycemia.  States he knows he is a diabetic and takes Metformin at home.  States compliance.  He works as a Estate agent on third shift.  24 hours ago while he was working he began to feel room spinning dizziness with nausea had had to stop working.  States he is still having room spinning dizziness but is improved.  He said the symptoms before when his sugar has been high.  Sugar has been in the 4-500 range at home.  He is supposed to see a PCP for the first time this week.  He does not take insulin. He complains of blurry vision, dizziness, nausea with one episode of vomiting.  No chest pain or shortness of breath. No abdominal pain.  No pain with urination or blood in the urine. Was diagnosed with atrial fibrillation in the past but stopped Eliquis after 1 month due to cost States he does have insurance now No focal weakness.  No numbness or tingling.  No difficulty speaking or difficulty swallowing.  The history is provided by the patient.  Hyperglycemia Associated symptoms: dizziness, nausea, vomiting and weakness   Associated symptoms: no abdominal pain, no chest pain, no dysuria, no fatigue, no fever and no shortness of breath        Past Medical History:  Diagnosis Date  . Aortic stenosis   . Diabetes mellitus (HCC)   . Essential hypertension   . Hypothyroidism   . Morbid obesity (HCC)   . Paroxysmal atrial flutter [facility])     Patient Active Problem List   Diagnosis Date Noted  . Aortic stenosis 08/29/2019  . History of atrial flutter 08/29/2019  . Subclinical hypothyroidism 08/29/2019  . Lower extremity edema 08/29/2019  .  Essential hypertension 08/29/2019  . Type 2 diabetes mellitus without complication, without long-term current use of insulin (HCC) 08/29/2019  . Atypical chest pain 08/28/2019    Past Surgical History:  Procedure Laterality Date  . CHOLECYSTECTOMY         Family History  Problem Relation Age of Onset  . Schizophrenia Mother   . COPD Mother   . Epilepsy Father   . Schizophrenia Brother     Social History   Tobacco Use  . Smoking status: Never Smoker  . Smokeless tobacco: Never Used  Vaping Use  . Vaping Use: Never used  Substance Use Topics  . Alcohol use: No  . Drug use: No    Home Medications Prior to Admission medications   Medication Sig Start Date End Date Taking? Authorizing Provider  apixaban (ELIQUIS) 5 MG TABS tablet Take 1 tablet (5 mg total) by mouth 2 (two) times daily. 12/20/19   Linwood Dibbles, MD  diltiazem (CARDIZEM CD) 120 MG 24 hr capsule Take 1 capsule (120 mg total) by mouth daily. 12/20/19   Linwood Dibbles, MD  metFORMIN (GLUCOPHAGE) 500 MG tablet Take 1 tablet (500 mg total) by mouth 2 (two) times daily with a meal. 08/23/20   Avegno, Zachery Dakins, FNP  pantoprazole (PROTONIX) 40 MG tablet Take 1 tablet (40 mg total) by mouth daily. 12/20/19   Linwood Dibbles, MD    Allergies    Patient  has no known allergies.  Review of Systems   Review of Systems  Constitutional: Negative for activity change, appetite change, fatigue and fever.  HENT: Negative for congestion and rhinorrhea.   Eyes: Negative for visual disturbance.  Respiratory: Negative for cough, chest tightness and shortness of breath.   Cardiovascular: Negative for chest pain.  Gastrointestinal: Positive for nausea and vomiting. Negative for abdominal pain.  Genitourinary: Negative for dysuria and hematuria.  Musculoskeletal: Negative for arthralgias and myalgias.  Neurological: Positive for dizziness, weakness and light-headedness. Negative for headaches.   all other systems are negative except as noted  in the HPI and PMH.   Physical Exam Updated Vital Signs BP (!) 145/94 (BP Location: Left Arm)   Pulse 73   Temp 98.1 F (36.7 C) (Oral)   Resp 18   Ht 6\' 2"  (1.88 m)   Wt 110.2 kg   SpO2 100%   BMI 31.20 kg/m   Physical Exam Vitals and nursing note reviewed.  Constitutional:      General: He is not in acute distress.    Appearance: He is well-developed.  HENT:     Head: Normocephalic and atraumatic.     Mouth/Throat:     Pharynx: No oropharyngeal exudate.  Eyes:     Conjunctiva/sclera: Conjunctivae normal.     Pupils: Pupils are equal, round, and reactive to light.  Neck:     Comments: No meningismus. Cardiovascular:     Rate and Rhythm: Normal rate and regular rhythm.     Heart sounds: Normal heart sounds. No murmur heard.   Pulmonary:     Effort: Pulmonary effort is normal. No respiratory distress.     Breath sounds: Normal breath sounds.  Abdominal:     Palpations: Abdomen is soft.     Tenderness: There is no abdominal tenderness. There is no guarding or rebound.  Musculoskeletal:        General: No tenderness. Normal range of motion.     Cervical back: Normal range of motion and neck supple.  Skin:    General: Skin is warm.  Neurological:     Mental Status: He is alert and oriented to person, place, and time.     Cranial Nerves: No cranial nerve deficit.     Motor: No abnormal muscle tone.     Coordination: Coordination normal.     Comments: CN 2-12 intact, no ataxia on finger to nose, no nystagmus, 5/5 strength throughout, no pronator drift,  Positive Romberg, gait not test  No appreciable nystagmus.  No ataxia on finger-to-nose.  No catch-up saccades on head impulse testing. Test of skew negative   Psychiatric:        Behavior: Behavior normal.     ED Results / Procedures / Treatments   Labs (all labs ordered are listed, but only abnormal results are displayed) Labs Reviewed  BASIC METABOLIC PANEL - Abnormal; Notable for the following components:       Result Value   Sodium 130 (*)    Chloride 97 (*)    Glucose, Bld 480 (*)    Calcium 8.8 (*)    All other components within normal limits  URINALYSIS, ROUTINE W REFLEX MICROSCOPIC - Abnormal; Notable for the following components:   Color, Urine STRAW (*)    Specific Gravity, Urine 1.033 (*)    Glucose, UA >=500 (*)    Ketones, ur 5 (*)    All other components within normal limits  CBG MONITORING, ED - Abnormal; Notable for the following  components:   Glucose-Capillary 466 (*)    All other components within normal limits  CBG MONITORING, ED - Abnormal; Notable for the following components:   Glucose-Capillary 406 (*)    All other components within normal limits  CBG MONITORING, ED - Abnormal; Notable for the following components:   Glucose-Capillary 318 (*)    All other components within normal limits  CBC  CBG MONITORING, ED  TROPONIN I (HIGH SENSITIVITY)  TROPONIN I (HIGH SENSITIVITY)    EKG EKG Interpretation  Date/Time:  Saturday November 21 2020 03:30:56 EDT Ventricular Rate:  70 PR Interval:    QRS Duration: 103 QT Interval:  409 QTC Calculation: 442 R Axis:   -39 Text Interpretation: Sinus rhythm Left axis deviation RSR' in V1 or V2, right VCD or RVH Nonspecific T abnormalities, lateral leads Nonspecific T wave abnormality No significant change was found Confirmed by Glynn Octave [phone]) on 11/21/2020 3:34:42 AM   Radiology No results found.  Procedures Procedures   Medications Ordered in ED Medications  sodium chloride 0.9 % bolus 1,000 mL (has no administration in time range)  insulin aspart (novoLOG) injection 10 Units (has no administration in time range)    ED Course  I have reviewed the triage vital signs and the nursing notes.  Pertinent labs & imaging results that were available during my care of the patient were reviewed by me and considered in my medical decision making (see chart for details).    MDM Rules/Calculators/A&P                          Hyperglycemia with known history of diabetes.  Room spinning dizziness with difficulty walking and nausea since last night.  Positive Romberg without ataxia or nystagmus  Anion gap is normal.  No evidence of DKA.  Patient given IV fluids as well as antiemetics. Troponin is negative.  EKG with unchanged T wave inversions.  IV fluids and insulin given.  Orthostatics are negative.  Troponin negative x2.  No chest pain or shortness of breath.  Patient does have a positive Romberg with some unsteadiness when he tries to walk with no focal deficits.  No ataxia and no nystagmus. MRI is not available. Patient feels improved with meclizine and Zofran.  CTA shows no vertebral artery stenosis or basilar occlusion.  Discussed with patient the stroke seems less likely but not ruled out completely without MRI.  He is able to ambulate. He understands this.  He declines transfer for MRI today stating he cannot do it today.  He is able to ambulate.  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  He is going to see Dr. Felecia Shelling for the first time on March 23.  He request refills of his Eliquis and Cardizem.  Delay in CTA imaging at shift change.  Expect discharge home if no gross abnormalities.  Dr. Charm Barges to assume care. Patient agreeable with plan for follow-up with Dr. Felecia Shelling this week.  He declines being transferred for [facility] at [facility].  He understands there is a small chance that a stroke could be missed.  Final Clinical Impression(s) / ED Diagnoses Final diagnoses:  Hyperglycemia  Dizziness    Rx / DC Orders ED Discharge Orders    None       Lilianne Delair, Jeannett Senior, MD 11/21/20 [phone]

## 2020-11-21 NOTE — ED Notes (Addendum)
Pt ambulated with steady gait, states he still feels a little weak and dizzy.

## 2020-11-21 NOTE — ED Notes (Signed)
Pt tolerated fluids well.  

## 2020-11-21 NOTE — ED Triage Notes (Signed)
Pt states his blood glucose is reading "high" on his machine. Pt states he feels "not himself". Blood glucose here reads 466. Pt only on Metformin. States he has appt. With Dr. Felecia Shelling Wednesday.

## 2020-12-10 ENCOUNTER — Other Ambulatory Visit: Payer: Self-pay

## 2020-12-10 ENCOUNTER — Ambulatory Visit
Admission: EM | Admit: 2020-12-10 | Discharge: 2020-12-10 | Disposition: A | Discharge status: Home / Self Care | Payer: 59

## 2020-12-10 ENCOUNTER — Encounter: Payer: Self-pay | Admitting: Emergency Medicine

## 2020-12-10 DIAGNOSIS — E1165 Type 2 diabetes mellitus with hyperglycemia: Secondary | ICD-10-CM

## 2020-12-10 DIAGNOSIS — R200 Anesthesia of skin: Secondary | ICD-10-CM

## 2020-12-10 DIAGNOSIS — R202 Paresthesia of skin: Secondary | ICD-10-CM

## 2020-12-10 NOTE — ED Provider Notes (Signed)
[facility] CARE CENTER   [id number] 12/10/20 Arrival Time: 1553  CC:LT hand numbness/ tingling  SUBJECTIVE: History from: patient. Johnathan Richmond is a 60 y.o. male complains of left hand numbness/ tingling that occurred 2 days ago, now resolved.  Denies a precipitating event or specific injury.  States it may be related to his diabetes, which is currently not well-controlled.  On metformin.  Daily sugars up into the 400's.  Has appt with PCP later this month.  Needs work note for missed days.  Denies similar symptoms in the past.  Denies fever, chills, erythema, ecchymosis, effusion, weakness, numbness and tingling, CP, SOB.  ROS: As per HPI.  All other pertinent ROS negative.     Past Medical History:  Diagnosis Date  . Aortic stenosis   . Diabetes mellitus (HCC)   . Essential hypertension   . Hypothyroidism   . Morbid obesity (HCC)   . Paroxysmal atrial flutter [facility])    Past Surgical History:  Procedure Laterality Date  . CHOLECYSTECTOMY     No Known Allergies No current facility-administered medications on file prior to encounter.   Current Outpatient Medications on File Prior to Encounter  Medication Sig Dispense Refill  . apixaban (ELIQUIS) 5 MG TABS tablet Take 1 tablet (5 mg total) by mouth 2 (two) times daily. 60 tablet 0  . diltiazem (CARDIZEM CD) 120 MG 24 hr capsule Take 1 capsule (120 mg total) by mouth daily. 30 capsule 0  . meclizine (ANTIVERT) 25 MG tablet Take 1 tablet (25 mg total) by mouth 3 (three) times daily as needed for dizziness. 30 tablet 0  . metFORMIN (GLUCOPHAGE) 500 MG tablet Take 1 tablet (500 mg total) by mouth 2 (two) times daily with a meal. 120 tablet 1  . ondansetron (ZOFRAN ODT) 4 MG disintegrating tablet Take 1 tablet (4 mg total) by mouth every 8 (eight) hours as needed for nausea or vomiting. 20 tablet 0  . pantoprazole (PROTONIX) 40 MG tablet Take 1 tablet (40 mg total) by mouth daily. 30 tablet 1   Social History   Socioeconomic History   . Marital status: Divorced    Spouse name: Not on file  . Number of children: 0  . Years of education: Not on file  . Highest education level: Not on file  Occupational History  . Occupation: Works at [employer]  . Smoking status: Never Smoker  . Smokeless tobacco: Never Used  Vaping Use  . Vaping Use: Never used  Substance and Sexual Activity  . Alcohol use: No  . Drug use: No  . Sexual activity: Not on file  Other Topics Concern  . Not on file  Social History Narrative  . Not on file   Social Determinants of Health   Financial Resource Strain: Not on file  Food Insecurity: Not on file  Transportation Needs: Not on file  Physical Activity: Not on file  Stress: Not on file  Social Connections: Not on file  Intimate Partner Violence: Not on file   Family History  Problem Relation Age of Onset  . Schizophrenia Mother   . COPD Mother   . Epilepsy Father   . Schizophrenia Brother     OBJECTIVE:  Vitals:   12/10/20 1624  BP: 126/84  Pulse: 80  Resp: 18  Temp: 98.6 F (37 C)  TempSrc: Oral  SpO2: 98%    General appearance: ALERT; in no acute distress.  Head: NCAT Lungs: Normal respiratory effort CV: radial  pulse 2+ Musculoskeletal: LT hand Inspection: Skin warm, dry, clear and intact without obvious erythema, effusion, or ecchymosis.  Palpation: Nontender to palpation ROM: FROM active and passive Strength: 5/5 grip strength Skin: warm and dry Neurologic: Ambulates without difficulty Psychological: alert and cooperative; normal mood and affect   ASSESSMENT & PLAN:  1. Numbness and tingling in left hand   2. Type 2 diabetes mellitus with hyperglycemia, without long-term current use of insulin (HCC)     Numbness/ tingling in left hand resolved Return to work note given Follow up with PCP Go to the ED if you experience any chest pain, shortness of breath, numbness/ tingling up arm, vision changes, slurred speech,  facial droop, weakness, etc...   Reviewed expectations re: course of current medical issues. Questions answered. Outlined signs and symptoms indicating need for more acute intervention. Patient verbalized understanding. After Visit Summary given.    Rennis Harding, PA-C 12/10/20 1724

## 2020-12-10 NOTE — Discharge Instructions (Addendum)
Numbness/ tingling in left hand resolved Return to work note given Follow up with PCP Go to the ED if you experience any chest pain, shortness of breath, numbness/ tingling up arm, vision changes, slurred speech, facial droop, weakness, etc..Marland Kitchen

## 2020-12-10 NOTE — ED Triage Notes (Signed)
States fingers on left hand have been tingling for the past 2 days.  States he was having tingling in his left foot and that has subsided now.

## 2020-12-24 ENCOUNTER — Other Ambulatory Visit: Payer: Self-pay

## 2020-12-24 ENCOUNTER — Encounter: Payer: Self-pay | Admitting: Emergency Medicine

## 2020-12-24 ENCOUNTER — Ambulatory Visit
Admission: EM | Admit: 2020-12-24 | Discharge: 2020-12-24 | Disposition: A | Discharge status: Home / Self Care | Payer: 59

## 2020-12-24 DIAGNOSIS — M25471 Effusion, right ankle: Secondary | ICD-10-CM

## 2020-12-24 NOTE — ED Provider Notes (Signed)
[facility] CARE CENTER   [id number] 12/24/20 Arrival Time: 1504  CC:RT ankle swelling  SUBJECTIVE: History from: patient. Johnathan Richmond is a 60 y.o. male complains of RT ankle swelling that occurred 2 days ago.  Reports swelling after working 13 hours on his feet.  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  Denies alleviating or aggravating factors.  Denies fever, chills, erythema, ecchymosis, weakness, numbness and tingling.      ROS: As per HPI.  All other pertinent ROS negative.     Past Medical History:  Diagnosis Date  . Aortic stenosis   . Diabetes mellitus (HCC)   . Essential hypertension   . Hypothyroidism   . Morbid obesity (HCC)   . Paroxysmal atrial flutter [facility])    Past Surgical History:  Procedure Laterality Date  . CHOLECYSTECTOMY     No Known Allergies No current facility-administered medications on file prior to encounter.   Current Outpatient Medications on File Prior to Encounter  Medication Sig Dispense Refill  . apixaban (ELIQUIS) 5 MG TABS tablet Take 1 tablet (5 mg total) by mouth 2 (two) times daily. 60 tablet 0  . diltiazem (CARDIZEM CD) 120 MG 24 hr capsule Take 1 capsule (120 mg total) by mouth daily. 30 capsule 0  . meclizine (ANTIVERT) 25 MG tablet Take 1 tablet (25 mg total) by mouth 3 (three) times daily as needed for dizziness. 30 tablet 0  . metFORMIN (GLUCOPHAGE) 500 MG tablet Take 1 tablet (500 mg total) by mouth 2 (two) times daily with a meal. 120 tablet 1  . ondansetron (ZOFRAN ODT) 4 MG disintegrating tablet Take 1 tablet (4 mg total) by mouth every 8 (eight) hours as needed for nausea or vomiting. 20 tablet 0  . pantoprazole (PROTONIX) 40 MG tablet Take 1 tablet (40 mg total) by mouth daily. 30 tablet 1   Social History   Socioeconomic History  . Marital status: Divorced    Spouse name: Not on file  . Number of children: 0  . Years of education: Not on file  . Highest education  level: Not on file  Occupational History  . Occupation: Works at [employer]  . Smoking status: Never Smoker  . Smokeless tobacco: Never Used  Vaping Use  . Vaping Use: Never used  Substance and Sexual Activity  . Alcohol use: No  . Drug use: No  . Sexual activity: Not on file  Other Topics Concern  . Not on file  Social History Narrative  . Not on file   Social Determinants of Health   Financial Resource Strain: Not on file  Food Insecurity: Not on file  Transportation Needs: Not on file  Physical Activity: Not on file  Stress: Not on file  Social Connections: Not on file  Intimate Partner Violence: Not on file   Family History  Problem Relation Age of Onset  . Schizophrenia Mother   . COPD Mother   . Epilepsy Father   . Schizophrenia Brother     OBJECTIVE:  Vitals:   12/24/20 1511  BP: 121/86  Pulse: 92  Resp: 18  Temp: 98 F (36.7 C)  TempSrc: Oral  SpO2: 97%    General appearance: ALERT; in no acute distress.  Head: NCAT Lungs: Normal respiratory effort CV: Dorsalis pedis  Musculoskeletal: RT ankle Inspection: mild diffuse over lateral aspect Palpation: Nontender to palpation ROM: FROM active and passive Skin:  warm and dry Neurologic: Ambulates without difficulty Psychological: alert and cooperative; normal mood and affect  ASSESSMENT & PLAN:  1. Right ankle swelling    Patient requests return to work note Continue conservative management of rest, ice, and elevation Ace applied Alternate ibuprofen and or tylenol for pain as needed Follow up with PCP if symptoms persist Return or go to the ER if you have any new or worsening symptoms (fever, chills, redness, worsening swelling, pain, etc...)   Reviewed expectations re: course of current medical issues. Questions answered. Outlined signs and symptoms indicating need for more acute intervention. Patient verbalized understanding. After Visit Summary given.     Rennis Harding, PA-C 12/24/20 1520

## 2020-12-24 NOTE — Discharge Instructions (Signed)
Patient requests return to work note Continue conservative management of rest, ice, and elevation Ace applied Alternate ibuprofen and or tylenol for pain as needed Follow up with PCP if symptoms persist Return or go to the ER if you have any new or worsening symptoms (fever, chills, redness, worsening swelling, pain, etc...)

## 2020-12-24 NOTE — ED Triage Notes (Signed)
Right ankle swelling and pain since Tuesday.  No known injury

## 2021-01-29 ENCOUNTER — Other Ambulatory Visit: Payer: Self-pay

## 2021-01-29 ENCOUNTER — Encounter: Payer: Self-pay | Admitting: Emergency Medicine

## 2021-01-29 ENCOUNTER — Ambulatory Visit
Admission: EM | Admit: 2021-01-29 | Discharge: 2021-01-29 | Disposition: A | Discharge status: Home / Self Care | Payer: Medicare Other

## 2021-01-29 DIAGNOSIS — M79605 Pain in left leg: Secondary | ICD-10-CM

## 2021-01-29 DIAGNOSIS — M79604 Pain in right leg: Secondary | ICD-10-CM | POA: Diagnosis not present

## 2021-01-29 NOTE — Discharge Instructions (Signed)
Continue conservative management of rest, ice, and elevation Use OTC tylenol and/or ibuprofen as needed for pain Follow up with cardiology for possible work restrictions Return or go to the ER if you have any new or worsening symptoms (fever, chills, chest pain, shortness of breath, redness, swelling, calf pain, calf swelling, etc...)

## 2021-01-29 NOTE — ED Provider Notes (Signed)
[facility]   [id number] 01/29/21 Arrival Time: 1555  CC: Leg PAIN  SUBJECTIVE: History from: patient. Johnathan Richmond is a 60 y.o. male complains of bilateral upper leg pain x few days.  Denies a precipitating event or specific injury.  Attributes his symptoms to working on concrete floors.  Localizes the pain to the upper thighs.  Describes the pain as intermittent and sharp in character.  Better with rest. Has called out of work the past few days, and would like to have tonight off as well.   Symptoms are made worse with working.  Denies similar symptoms in the past.  Denies fever, chills, erythema, ecchymosis, effusion, weakness, numbness and tingling, back pain, cp, SOB, calf pain, calf swelling.    ROS: As per HPI.  All other pertinent ROS negative.     Past Medical History:  Diagnosis Date  . Aortic stenosis   . Diabetes mellitus (HCC)   . Essential hypertension   . Hypothyroidism   . Morbid obesity (HCC)   . Paroxysmal atrial flutter [facility])    Past Surgical History:  Procedure Laterality Date  . CHOLECYSTECTOMY     No Known Allergies No current facility-administered medications on file prior to encounter.   Current Outpatient Medications on File Prior to Encounter  Medication Sig Dispense Refill  . apixaban (ELIQUIS) 5 MG TABS tablet Take 1 tablet (5 mg total) by mouth 2 (two) times daily. 60 tablet 0  . diltiazem (CARDIZEM CD) 120 MG 24 hr capsule Take 1 capsule (120 mg total) by mouth daily. 30 capsule 0  . meclizine (ANTIVERT) 25 MG tablet Take 1 tablet (25 mg total) by mouth 3 (three) times daily as needed for dizziness. 30 tablet 0  . metFORMIN (GLUCOPHAGE) 500 MG tablet Take 1 tablet (500 mg total) by mouth 2 (two) times daily with a meal. 120 tablet 1  . ondansetron (ZOFRAN ODT) 4 MG disintegrating tablet Take 1 tablet (4 mg total) by mouth every 8 (eight) hours as needed for nausea or vomiting. 20 tablet 0  . pantoprazole (PROTONIX) 40 MG tablet Take 1  tablet (40 mg total) by mouth daily. 30 tablet 1   Social History   Socioeconomic History  . Marital status: Divorced    Spouse name: Not on file  . Number of children: 0  . Years of education: Not on file  . Highest education level: Not on file  Occupational History  . Occupation: Works at [employer]  . Smoking status: Never Smoker  . Smokeless tobacco: Never Used  Vaping Use  . Vaping Use: Never used  Substance and Sexual Activity  . Alcohol use: No  . Drug use: No  . Sexual activity: Not on file  Other Topics Concern  . Not on file  Social History Narrative  . Not on file   Social Determinants of Health   Financial Resource Strain: Not on file  Food Insecurity: Not on file  Transportation Needs: Not on file  Physical Activity: Not on file  Stress: Not on file  Social Connections: Not on file  Intimate Partner Violence: Not on file   Family History  Problem Relation Age of Onset  . Schizophrenia Mother   . COPD Mother   . Epilepsy Father   . Schizophrenia Brother     OBJECTIVE:  Vitals:   01/29/21 1720  BP: (!) 154/82  Pulse: 75  Resp: 17  Temp: 97.8 F (36.6 C)  TempSrc: Oral  SpO2: 98%    General appearance: ALERT; in no acute distress.  Head: NCAT Lungs: Normal respiratory effort CV: posterior tibialis pulse 2+ Musculoskeletal: Bilateral leg pain Inspection: Skin warm, dry, clear and intact without obvious erythema, effusion, or ecchymosis.  Palpation: mildly TTP over upper medial thigh Skin: warm and dry Neurologic: Ambulates without difficulty Psychological: alert and cooperative; normal mood and affect  ASSESSMENT & PLAN:  1. Bilateral leg pain    Continue conservative management of rest, ice, and elevation Use OTC tylenol and/or ibuprofen as needed for pain Follow up with cardiology for possible work restrictions Return or go to the ER if you have any new or worsening symptoms (fever, chills, chest  pain, shortness of breath, redness, swelling, calf pain, calf swelling, etc...)   Reviewed expectations re: course of current medical issues. Questions answered. Outlined signs and symptoms indicating need for more acute intervention. Patient verbalized understanding. After Visit Summary given.    Rennis Harding, PA-C 01/29/21 1754

## 2021-01-29 NOTE — ED Triage Notes (Signed)
Pt is having bilateral leg pain since Tuesday night.  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

## 2021-03-01 ENCOUNTER — Other Ambulatory Visit: Payer: Self-pay

## 2021-03-01 ENCOUNTER — Ambulatory Visit
Admission: EM | Admit: 2021-03-01 | Discharge: 2021-03-01 | Disposition: A | Discharge status: Home / Self Care | Payer: Medicare Other | Attending: Family Medicine | Admitting: Family Medicine

## 2021-03-01 DIAGNOSIS — E860 Dehydration: Secondary | ICD-10-CM

## 2021-03-01 DIAGNOSIS — E1169 Type 2 diabetes mellitus with other specified complication: Secondary | ICD-10-CM | POA: Diagnosis not present

## 2021-03-01 DIAGNOSIS — R42 Dizziness and giddiness: Secondary | ICD-10-CM

## 2021-03-01 LAB — POCT URINALYSIS DIP (MANUAL ENTRY)
Bilirubin, UA: NEGATIVE
Blood, UA: NEGATIVE
Glucose, UA: NEGATIVE mg/dL
Ketones, POC UA: NEGATIVE mg/dL
Leukocytes, UA: NEGATIVE
Nitrite, UA: NEGATIVE
Protein Ur, POC: NEGATIVE mg/dL
Spec Grav, UA: >=1.03 — AB (ref 1.010–1.025)
Urobilinogen, UA: 0.2 E.U./dL
pH, UA: 5 (ref 5.0–8.0)

## 2021-03-01 LAB — POCT FASTING CBG KUC MANUAL ENTRY: POCT Glucose (KUC): 115 mg/dL — AB (ref 70–99)

## 2021-03-01 NOTE — ED Triage Notes (Signed)
Pt presents with complaints of feeling lightheaded off and on since Thursday. Reports blood sugar is usually in the 400's and now is in the 100's. Concerned that his body is adjusting to a normal blood sugar.

## 2021-03-01 NOTE — Discharge Instructions (Addendum)
Your urine shows that you are very dehydrated which I suspect is source of your dizziness.  Due to the heat and your current work environment recommend drinking at minimum 5 to 6, 16 ounce bottles of water.  per day to maintain adequate hydration.   Continue to check your blood sugar and take medication as prescribed. If any of your symptoms worsen or become severe go immediately to the emergency department for further work-up and evaluation.

## 2021-03-01 NOTE — ED Provider Notes (Signed)
[facility]    CSN: [id number] Arrival date & time: 03/01/21  1613      History   Chief Complaint Chief Complaint  Patient presents with   Dizziness    HPI Johnathan Richmond is a 60 y.o. male.   HPI Patient presents today for evaluation of dizziness which he suspects was related to his blood sugar.  He reports a history of poorly controlled diabetes with blood sugars ranging in the 400s.  He reports since his blood sugars have regain control and have remained in the lower 100 he has had some episodic dizziness.  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  He drinks diet sodas denies any intake of any sugary sweetened beverages.  Patient denies headache, chest pain or shortness of breath Past Medical History:  Diagnosis Date   Aortic stenosis    Diabetes mellitus (HCC)    Essential hypertension    Hypothyroidism    Morbid obesity (HCC)    Paroxysmal atrial flutter (HCC)     Patient Active Problem List   Diagnosis Date Noted   Aortic stenosis 08/29/2019   History of atrial flutter 08/29/2019   Subclinical hypothyroidism 08/29/2019   Lower extremity edema 08/29/2019   Essential hypertension 08/29/2019   Type 2 diabetes mellitus without complication, without long-term current use of insulin (HCC) 08/29/2019   Atypical chest pain 08/28/2019    Past Surgical History:  Procedure Laterality Date   CHOLECYSTECTOMY         Home Medications    Prior to Admission medications   Medication Sig Start Date End Date Taking? Authorizing Provider  apixaban (ELIQUIS) 5 MG TABS tablet Take 1 tablet (5 mg total) by mouth 2 (two) times daily. 11/21/20   Rancour, Jeannett Senior, MD  diltiazem (CARDIZEM CD) 120 MG 24 hr capsule Take 1 capsule (120 mg total) by mouth daily. 11/21/20   Rancour, Jeannett Senior, MD  meclizine (ANTIVERT) 25 MG tablet Take 1 tablet (25 mg total) by mouth 3 (three) times daily as needed for dizziness. 11/21/20    Rancour, Jeannett Senior, MD  metFORMIN (GLUCOPHAGE) 500 MG tablet Take 1 tablet (500 mg total) by mouth 2 (two) times daily with a meal. 08/23/20   Avegno, Zachery Dakins, FNP  ondansetron (ZOFRAN ODT) 4 MG disintegrating tablet Take 1 tablet (4 mg total) by mouth every 8 (eight) hours as needed for nausea or vomiting. 11/21/20   Rancour, Jeannett Senior, MD  pantoprazole (PROTONIX) 40 MG tablet Take 1 tablet (40 mg total) by mouth daily. 12/20/19   Linwood Dibbles, MD    Family History Family History  Problem Relation Age of Onset   Schizophrenia Mother    COPD Mother    Epilepsy Father    Schizophrenia Brother     Social History Social History   Tobacco Use   Smoking status: Never   Smokeless tobacco: Never  Vaping Use   Vaping Use: Never used  Substance Use Topics   Alcohol use: No   Drug use: No     Allergies   Patient has no known allergies.   Review of Systems Review of Systems Pertinent negatives listed in HPI  Physical Exam Triage Vital Signs ED Triage Vitals  Enc Vitals Group     BP 03/01/21 1718 (!) 146/80     Pulse Rate 03/01/21 1718 81     Resp 03/01/21 1718 17     Temp 03/01/21 1718 97.6 F (36.4 C)  Temp Source 03/01/21 1718 Tympanic     SpO2 03/01/21 1718 97 %     Weight --      Height --      Head Circumference --      Peak Flow --      Pain Score 03/01/21 1728 0     Pain Loc --      Pain Edu? --      Excl. in GC? --    No data found.  Updated Vital Signs BP (!) 146/80 (BP Location: Right Arm)   Pulse 81   Temp 97.6 F (36.4 C) (Tympanic)   Resp 17   SpO2 97%   Visual Acuity Right Eye Distance:   Left Eye Distance:   Bilateral Distance:    Right Eye Near:   Left Eye Near:    Bilateral Near:     Physical Exam Constitutional: Patient appears well-developed and well-nourished. No distress. HENT: Normocephalic, atraumatic, External right and left ear normal. Oropharynx is clear and moist.  Eyes: Conjunctivae and EOM are normal. PERRLA, no scleral  icterus. Neck: Normal ROM. Neck supple. No JVD. No tracheal deviation. No thyromegaly. CVS: RRR, S1/S2 +, no murmurs, no gallops, no carotid bruit.  Pulmonary: Effort and breath sounds normal, no stridor, rhonchi, wheezes, rales.  Abdominal: Soft. BS +, no distension, tenderness, rebound or guarding.  Musculoskeletal: Normal range of motion. No edema and no tenderness.  Neuro: Alert. Normal reflexes, muscle tone coordination. No cranial nerve deficit. Skin: Skin is warm and dry. No rash noted. Not diaphoretic. No erythema. No pallor. Psychiatric: Normal mood and affect. Behavior, judgment, thought content normal.   UC Treatments / Results  Labs (all labs ordered are listed, but only abnormal results are displayed) Labs Reviewed  POCT FASTING CBG [facility] MANUAL ENTRY - Abnormal; Notable for the following components:      Result Value   POCT Glucose ([facility]) 115 (*)    All other components within normal limits  POCT URINALYSIS DIP (MANUAL ENTRY) - Abnormal; Notable for the following components:   Spec Grav, UA >=1.030 (*)    All other components within normal limits    EKG   Radiology No results found.  Procedures Procedures (including critical care time)  Medications Ordered in UC Medications - No data to display  Initial Impression / Assessment and Plan / UC Course  I have reviewed the triage vital signs and the nursing notes.  Pertinent labs & imaging results that were available during my care of the patient were reviewed by me and considered in my medical decision making (see chart for details).     Type 2 diabetes blood sugar stable here in clinic today.  Patient urine highly concentrated therefore suspect dizziness is related to moderate dehydration.  Educated patient on the importance of hydrating well given diabetes and current humid hot weather conditions can easily call dehydration.  Patient advised if any of his symptoms worsen or do not improve go immediately to the  ER Final Clinical Impressions(s) / UC Diagnoses   Final diagnoses:  Type 2 diabetes mellitus with other specified complication, without long-term current use of insulin (HCC)  Moderate dehydration  Dizziness     Discharge Instructions      Your urine shows that you are very dehydrated which I suspect is source of your dizziness.  Due to the heat and your current work environment recommend drinking at minimum 5 to 6, 16 ounce bottles of water.  per day to maintain adequate  hydration.   Continue to check your blood sugar and take medication as prescribed. If any of your symptoms worsen or become severe go immediately to the emergency department for further work-up and evaluation.   ED Prescriptions   None    PDMP not reviewed this encounter.   Bing Neighbors, [place] 03/07/21 [phone]

## 2021-10-19 ENCOUNTER — Ambulatory Visit
Admission: EM | Admit: 2021-10-19 | Discharge: 2021-10-19 | Disposition: A | Discharge status: Home / Self Care | Payer: Medicare Other | Attending: Student | Admitting: Student

## 2021-10-19 ENCOUNTER — Other Ambulatory Visit: Payer: Self-pay

## 2021-10-19 DIAGNOSIS — Z7984 Long term (current) use of oral hypoglycemic drugs: Secondary | ICD-10-CM

## 2021-10-19 DIAGNOSIS — E119 Type 2 diabetes mellitus without complications: Secondary | ICD-10-CM | POA: Diagnosis not present

## 2021-10-19 DIAGNOSIS — Z7901 Long term (current) use of anticoagulants: Secondary | ICD-10-CM | POA: Diagnosis not present

## 2021-10-19 DIAGNOSIS — R052 Subacute cough: Secondary | ICD-10-CM

## 2021-10-19 DIAGNOSIS — E1169 Type 2 diabetes mellitus with other specified complication: Secondary | ICD-10-CM

## 2021-10-19 MED ORDER — PREDNISONE 20 MG PO TABS: 20.0000 mg | ORAL_TABLET | Freq: Every day | ORAL | 0 refills | Status: AC

## 2021-10-19 MED ORDER — FEXOFENADINE HCL 60 MG PO TABS: 60.0000 mg | ORAL_TABLET | Freq: Every day | ORAL | 2 refills | Status: DC

## 2021-10-19 NOTE — ED Provider Notes (Signed)
[facility]    CSN: [id number] Arrival date & time: 10/19/21  1330      History   Chief Complaint Chief Complaint  Patient presents with   Nasal Congestion   Cough    HPI Johnathan Richmond is a 61 y.o. male presenting with congestion.  Medical history atrial flutter, hypertension, long-term anticoagulation. Cough and congestion x2 weeks following exposure to dust. States some congestion at home but this is mainly at work. States he does have a diagnosis of bronchitis but not COPD or asthma, doesn't require inhalers. Never smoker. Theraflu provided minimal relief. Has not attempted allergy medications.   HPI  Past Medical History:  Diagnosis Date   Aortic stenosis    Diabetes mellitus ([place])    Essential hypertension    Hypothyroidism    Morbid obesity ([place])    Paroxysmal atrial flutter ([place])     Patient Active Problem List   Diagnosis Date Noted   Aortic stenosis 08/29/2019   History of atrial flutter 08/29/2019   Subclinical hypothyroidism 08/29/2019   Lower extremity edema 08/29/2019   Essential hypertension 08/29/2019   Type 2 diabetes mellitus without complication, without long-term current use of insulin ([place]) 08/29/2019   Atypical chest pain 08/28/2019    Past Surgical History:  Procedure Laterality Date   CHOLECYSTECTOMY         Home Medications    Prior to Admission medications   Medication Sig Start Date End Date Taking? Authorizing Provider  fexofenadine (ALLEGRA) 60 MG tablet Take 1 tablet (60 mg total) by mouth daily. 10/19/21  Yes Hazel Sams, PA-C  predniSONE (DELTASONE) 20 MG tablet Take 1 tablet (20 mg total) by mouth daily for 5 days. 10/19/21 10/24/21 Yes Hazel Sams, PA-C  apixaban (ELIQUIS) 5 MG TABS tablet Take 1 tablet (5 mg total) by mouth 2 (two) times daily. 11/21/20   Rancour, Annie Main, MD  diltiazem (CARDIZEM CD) 120 MG 24 hr capsule Take 1 capsule (120 mg total) by mouth daily. 11/21/20   Rancour, Annie Main, MD   meclizine (ANTIVERT) 25 MG tablet Take 1 tablet (25 mg total) by mouth 3 (three) times daily as needed for dizziness. 11/21/20   Rancour, Annie Main, MD  metFORMIN (GLUCOPHAGE) 500 MG tablet Take 1 tablet (500 mg total) by mouth 2 (two) times daily with a meal. 08/23/20   Avegno, Darrelyn Hillock, FNP  pantoprazole (PROTONIX) 40 MG tablet Take 1 tablet (40 mg total) by mouth daily. 12/20/19   Dorie Rank, MD    Family History Family History  Problem Relation Age of Onset   Schizophrenia Mother    COPD Mother    Epilepsy Father    Schizophrenia Brother     Social History Social History   Tobacco Use   Smoking status: Never   Smokeless tobacco: Never  Vaping Use   Vaping Use: Never used  Substance Use Topics   Alcohol use: No   Drug use: No     Allergies   Patient has no known allergies.   Review of Systems Review of Systems  Constitutional:  Negative for appetite change, chills and fever.  HENT:  Positive for congestion. Negative for ear pain, rhinorrhea, sinus pressure, sinus pain and sore throat.   Eyes:  Negative for redness and visual disturbance.  Respiratory:  Positive for cough. Negative for chest tightness, shortness of breath and wheezing.   Cardiovascular:  Negative for chest pain and palpitations.  Gastrointestinal:  Negative for abdominal pain, constipation, diarrhea, nausea and vomiting.  Genitourinary:  Negative for dysuria, frequency and urgency.  Musculoskeletal:  Negative for myalgias.  Neurological:  Negative for dizziness, weakness and headaches.  Psychiatric/Behavioral:  Negative for confusion.   All other systems reviewed and are negative.   Physical Exam Triage Vital Signs ED Triage Vitals  Enc Vitals Group     BP      Pulse      Resp      Temp      Temp src      SpO2      Weight      Height      Head Circumference      Peak Flow      Pain Score      Pain Loc      Pain Edu?      Excl. in GC?    No data found.  Updated Vital Signs BP  139/84 (BP Location: Right Arm)    Pulse 97    Temp 97.9 F (36.6 C) (Oral)    Resp 20    SpO2 94%   Visual Acuity Right Eye Distance:   Left Eye Distance:   Bilateral Distance:    Right Eye Near:   Left Eye Near:    Bilateral Near:     Physical Exam Vitals reviewed.  Constitutional:      General: He is not in acute distress.    Appearance: Normal appearance. He is not ill-appearing.  HENT:     Head: Normocephalic and atraumatic.     Right Ear: Tympanic membrane, ear canal and external ear normal. No tenderness. No middle ear effusion. There is no impacted cerumen. Tympanic membrane is not perforated, erythematous, retracted or bulging.     Left Ear: Tympanic membrane, ear canal and external ear normal. No tenderness.  No middle ear effusion. There is no impacted cerumen. Tympanic membrane is not perforated, erythematous, retracted or bulging.     Nose: Nose normal. No congestion.     Mouth/Throat:     Mouth: Mucous membranes are moist.     Pharynx: Uvula midline. No oropharyngeal exudate or posterior oropharyngeal erythema.  Eyes:     Extraocular Movements: Extraocular movements intact.     Pupils: Pupils are equal, round, and reactive to light.  Cardiovascular:     Rate and Rhythm: Normal rate and regular rhythm.     Heart sounds: Normal heart sounds.  Pulmonary:     Effort: Pulmonary effort is normal.     Breath sounds: Normal breath sounds. No decreased breath sounds, wheezing, rhonchi or rales.  Abdominal:     Palpations: Abdomen is soft.     Tenderness: There is no abdominal tenderness. There is no guarding or rebound.  Lymphadenopathy:     Cervical: No cervical adenopathy.     Right cervical: No superficial cervical adenopathy.    Left cervical: No superficial cervical adenopathy.  Neurological:     General: No focal deficit present.     Mental Status: He is alert and oriented to person, place, and time.  Psychiatric:        Mood and Affect: Mood normal.         Behavior: Behavior normal.        Thought Content: Thought content normal.        Judgment: Judgment normal.     UC Treatments / Results  Labs (all labs ordered are listed, but only abnormal results are displayed) Labs Reviewed - No data to display  EKG  Radiology No results found.  Procedures Procedures (including critical care time)  Medications Ordered in UC Medications - No data to display  Initial Impression / Assessment and Plan / UC Course  I have reviewed the triage vital signs and the nursing notes.  Pertinent labs & imaging results that were available during my care of the patient were reviewed by me and considered in my medical decision making (see chart for details).     This patient is a very pleasant 61 y.o. year old male presenting with allergic cough x 2 weeks. Afebrile, nontachy. Breath sounds clear throughout. No diagnosis of pulm ds, no inhalers, never smoker. Symptoms primarily at work around dust.  Has not attempted allergy medication. Allegra sent. He is a diabetic; low-dose prednisone sent.   ED return precautions discussed. Patient verbalizes understanding and agreement.     Final Clinical Impressions(s) / UC Diagnoses   Final diagnoses:  Subacute cough  Type 2 diabetes mellitus with other specified complication, without long-term current use of insulin (HCC)  Diabetes mellitus treated with oral medication ([place])  Long term current use of anticoagulant therapy     Discharge Instructions      -Prednisone one pill with breakfast or lunch x5 days -Allegra once daily for at least 2 weeks -Follow-up if symptoms worsen/persist.     ED Prescriptions     Medication Sig Dispense Auth. Provider   predniSONE (DELTASONE) 20 MG tablet Take 1 tablet (20 mg total) by mouth daily for 5 days. 5 tablet Hazel Sams, PA-C   fexofenadine (ALLEGRA) 60 MG tablet Take 1 tablet (60 mg total) by mouth daily. 30 tablet Hazel Sams, PA-C      PDMP  not reviewed this encounter.   Hazel Sams, PA-C 10/19/21 1359

## 2021-10-19 NOTE — Discharge Instructions (Addendum)
-  Prednisone one pill with breakfast or lunch x5 days -Allegra once daily for at least 2 weeks -Follow-up if symptoms worsen/persist.

## 2021-10-19 NOTE — ED Triage Notes (Signed)
Pt reports cough, chest congestion and nasal congestion x 2 weeks. States cough is worse at work as they're place is full of dust. States he feels weak at work when he has to pick up any box at work.

## 2021-11-17 ENCOUNTER — Encounter (HOSPITAL_COMMUNITY): Payer: Self-pay

## 2021-11-17 ENCOUNTER — Other Ambulatory Visit: Payer: Self-pay

## 2021-11-17 ENCOUNTER — Emergency Department (HOSPITAL_COMMUNITY)
Admission: EM | Admit: 2021-11-17 | Discharge: 2021-11-17 | Disposition: A | Discharge status: Home / Self Care | Payer: Medicare Other | Attending: Emergency Medicine | Admitting: Emergency Medicine

## 2021-11-17 DIAGNOSIS — R739 Hyperglycemia, unspecified: Secondary | ICD-10-CM

## 2021-11-17 DIAGNOSIS — Z7984 Long term (current) use of oral hypoglycemic drugs: Secondary | ICD-10-CM | POA: Insufficient documentation

## 2021-11-17 DIAGNOSIS — E1165 Type 2 diabetes mellitus with hyperglycemia: Secondary | ICD-10-CM | POA: Diagnosis not present

## 2021-11-17 DIAGNOSIS — Z7901 Long term (current) use of anticoagulants: Secondary | ICD-10-CM | POA: Diagnosis not present

## 2021-11-17 LAB — BLOOD GAS, VENOUS
Acid-Base Excess: 0.5 mmol/L (ref 0.0–2.0)
Bicarbonate: 26 mmol/L (ref 20.0–28.0)
Drawn by: 12816
FIO2: 21 %
O2 Saturation: 61.2 %
Patient temperature: 36.5
pCO2, Ven: 43 mmHg — ABNORMAL LOW (ref 44–60)
pH, Ven: 7.39 (ref 7.25–7.43)
pO2, Ven: 32 mmHg (ref 32–45)

## 2021-11-17 LAB — URINALYSIS, ROUTINE W REFLEX MICROSCOPIC
Bacteria, UA: NONE SEEN
Bilirubin Urine: NEGATIVE
Glucose, UA: >=500 mg/dL — AB
Hgb urine dipstick: NEGATIVE
Ketones, ur: 20 mg/dL — AB
Leukocytes,Ua: NEGATIVE
Nitrite: NEGATIVE
Protein, ur: NEGATIVE mg/dL
Specific Gravity, Urine: 1.028 (ref 1.005–1.030)
pH: 6 (ref 5.0–8.0)

## 2021-11-17 LAB — BASIC METABOLIC PANEL
Anion gap: 9 (ref 5–15)
BUN: 17 mg/dL (ref 8–23)
CO2: 23 mmol/L (ref 22–32)
Calcium: 8.4 mg/dL — ABNORMAL LOW (ref 8.9–10.3)
Chloride: 98 mmol/L (ref 98–111)
Creatinine, Ser: 1.06 mg/dL (ref 0.61–1.24)
GFR, Estimated: >60 mL/min (ref >60)
Glucose, Bld: 626 mg/dL (ref 70–99)
Potassium: 4 mmol/L (ref 3.5–5.1)
Sodium: 130 mmol/L — ABNORMAL LOW (ref 135–145)

## 2021-11-17 LAB — CBC
HCT: 42 % (ref 39.0–52.0)
Hemoglobin: 14.1 g/dL (ref 13.0–17.0)
MCH: 27.4 pg (ref 26.0–34.0)
MCHC: 33.6 g/dL (ref 30.0–36.0)
MCV: 81.7 fL (ref 80.0–100.0)
Platelets: 232 10*3/uL (ref 150–400)
RBC: 5.14 MIL/uL (ref 4.22–5.81)
RDW: 13.7 % (ref 11.5–15.5)
WBC: 4.8 10*3/uL (ref 4.0–10.5)
nRBC: 0 % (ref 0.0–0.2)

## 2021-11-17 LAB — CBG MONITORING, ED
Glucose-Capillary: >600 mg/dL (ref 70–99)
Glucose-Capillary: 409 mg/dL — ABNORMAL HIGH (ref 70–99)
Glucose-Capillary: 387 mg/dL — ABNORMAL HIGH (ref 70–99)
Glucose-Capillary: 294 mg/dL — ABNORMAL HIGH (ref 70–99)
Glucose-Capillary: 128 mg/dL — ABNORMAL HIGH (ref 70–99)
Glucose-Capillary: 141 mg/dL — ABNORMAL HIGH (ref 70–99)

## 2021-11-17 MED ORDER — METFORMIN HCL 500 MG PO TABS: 500.0000 mg | ORAL_TABLET | Freq: Two times a day (BID) | ORAL | 1 refills | Status: DC

## 2021-11-17 MED ORDER — DEXTROSE 50 % IV SOLN: 0.0000 mL | INTRAVENOUS | Status: DC | PRN

## 2021-11-17 MED ORDER — PANTOPRAZOLE SODIUM 40 MG PO TBEC: 40.0000 mg | DELAYED_RELEASE_TABLET | Freq: Every day | ORAL | 1 refills | Status: DC

## 2021-11-17 MED ORDER — INSULIN REGULAR(HUMAN) IN NACL 100-0.9 UT/100ML-% IV SOLN
INTRAVENOUS | Status: DC
  Administered 2021-11-17: 13 [IU]/h via INTRAVENOUS
  Filled 2021-11-17: qty 100

## 2021-11-17 MED ORDER — DILTIAZEM HCL ER COATED BEADS 120 MG PO CP24: 120.0000 mg | ORAL_CAPSULE | Freq: Every day | ORAL | 0 refills | Status: DC

## 2021-11-17 MED ORDER — DEXTROSE IN LACTATED RINGERS 5 % IV SOLN: INTRAVENOUS | Status: DC

## 2021-11-17 MED ORDER — APIXABAN 5 MG PO TABS: 5.0000 mg | ORAL_TABLET | Freq: Two times a day (BID) | ORAL | 0 refills | Status: DC

## 2021-11-17 MED ORDER — LACTATED RINGERS IV SOLN: INTRAVENOUS | Status: DC

## 2021-11-17 NOTE — ED Notes (Signed)
Discharge instructions including prescription and follow up care discussed with pt. Pt verbalized understanding with no questions at this time.  

## 2021-11-17 NOTE — ED Provider Notes (Signed)
?[facility] ?Provider Note ? ? ?CSN: [id number] ?Arrival date & time: 11/17/21  1342 ? ?  ? ?History ? ?Chief Complaint  ?Patient presents with  ? Hyperglycemia  ? ? ?Johnathan Richmond is a 61 y.o. male. ? ?HPI ?Patient presents from work with concern for hyperglycemia.  He notes that he is taken no antihyperglycemics in about 1 year.  Today, at work, with polyuria, polydipsia, he had his glucose checked and was sent here after he was found have critically abnormal value.  He denies pain, actual vomiting, fever ?  ? ?Home Medications ?Prior to Admission medications   ?Medication Sig Start Date End Date Taking? Authorizing Provider  ?apixaban (ELIQUIS) 5 MG TABS tablet Take 1 tablet (5 mg total) by mouth 2 (two) times daily. 11/21/20   Rancour, Jeannett Senior, MD  ?diltiazem (CARDIZEM CD) 120 MG 24 hr capsule Take 1 capsule (120 mg total) by mouth daily. 11/21/20   Rancour, Jeannett Senior, MD  ?fexofenadine (ALLEGRA) 60 MG tablet Take 1 tablet (60 mg total) by mouth daily. 10/19/21   Rhys Martini, PA-C  ?meclizine (ANTIVERT) 25 MG tablet Take 1 tablet (25 mg total) by mouth 3 (three) times daily as needed for dizziness. 11/21/20   Rancour, Jeannett Senior, MD  ?metFORMIN (GLUCOPHAGE) 500 MG tablet Take 1 tablet (500 mg total) by mouth 2 (two) times daily with a meal. 08/23/20   Avegno, Zachery Dakins, FNP  ?pantoprazole (PROTONIX) 40 MG tablet Take 1 tablet (40 mg total) by mouth daily. 12/20/19   Linwood Dibbles, MD  ?   ? ?Allergies    ?Patient has no known allergies.   ? ?Review of Systems   ?Review of Systems  ?Constitutional:   ?     Per HPI, otherwise negative  ?HENT:    ?     Per HPI, otherwise negative  ?Respiratory:    ?     Per HPI, otherwise negative  ?Cardiovascular:   ?     Per HPI, otherwise negative  ?Gastrointestinal:  Negative for vomiting.  ?Endocrine:  ?     Negative aside from HPI  ?Genitourinary:   ?     Neg aside from HPI   ?Musculoskeletal:   ?     Per HPI, otherwise negative  ?Skin: Negative.    ?Neurological:  Negative for syncope.  ? ?Physical Exam ?Updated Vital Signs ?BP 134/82   Pulse 85   Temp 97.8 ?F (36.6 ?C) (Oral)   Resp 13   Ht 6\' 2"  (1.88 m)   Wt 122.9 kg   SpO2 97%   BMI 34.79 kg/m?  ?Physical Exam ?Vitals and nursing note reviewed.  ?Constitutional:   ?   General: He is not in acute distress. ?   Appearance: He is well-developed.  ?HENT:  ?   Head: Normocephalic and atraumatic.  ?Eyes:  ?   Conjunctiva/sclera: Conjunctivae normal.  ?Cardiovascular:  ?   Rate and Rhythm: Normal rate and regular rhythm.  ?Pulmonary:  ?   Effort: Pulmonary effort is normal. No respiratory distress.  ?   Breath sounds: No stridor.  ?Abdominal:  ?   General: There is no distension.  ?Skin: ?   General: Skin is warm and dry.  ?Neurological:  ?   Mental Status: He is alert and oriented to person, place, and time.  ? ? ?ED Results / Procedures / Treatments   ?Labs ?(all labs ordered are listed, but only abnormal results are displayed) ?Labs Reviewed  ?BASIC METABOLIC PANEL -  Abnormal; Notable for the following components:  ?    Result Value  ? Sodium 130 (*)   ? Glucose, Bld 626 (*)   ? Calcium 8.4 (*)   ? All other components within normal limits  ?CBG MONITORING, ED - Abnormal; Notable for the following components:  ? Glucose-Capillary >600 (*)   ? All other components within normal limits  ?CBC  ?URINALYSIS, ROUTINE W REFLEX MICROSCOPIC  ?BLOOD GAS, VENOUS  ? ? ?EKG ?None ? ?Radiology ?No results found. ? ?Procedures ?Procedures  ? ? ?Medications Ordered in ED ?Medications - No data to display ? ?ED Course/ Medical Decision Making/ A&P ?This patient presents to the ED for concern of hyperglycemia polyuria, polydipsia in the context of a patient with known diabetes who has not taken antihyperglycemics in about 1 year, this involves an extensive number of treatment options, and is a complaint that carries with it a high risk of complications and morbidity.  The differential diagnosis includes DKA, nonketotic  hyperosmolar state, hyperglycemia, electrolyte abnormalities, dehydration ? ? ?Co morbidities that complicate the patient evaluation ? ?Obesity, medication noncompliance ? ?Social Determinants of Health: ? ?Obesity, difficulty with obtaining medicine ? ?Additional history obtained: ? ?Additional history and/or information obtained from chart review ?External records from outside source obtained and reviewed including angiography performed earlier this year and reassuring without acute intracranial abnormality ? ? ?After the initial evaluation, orders, including: Labs urinalysis, fluids were initiated. ? ? ?Patient placed on Cardiac and Pulse-Oximetry Monitors. ?The patient was maintained on a cardiac monitor.  The cardiac monitored showed an rhythm sinus 85 unremarkable ?The patient was also maintained on pulse oximetry. The readings were typically 99% room air normal ? ? ?On repeat evaluation of the patient improved ? ?Lab Tests: ? ?I personally interpreted labs.  The pertinent results include: Substantial hyperglycemia, ketone urea, but no evidence distress, patient started on continuous insulin drip, fluids ? ? ? ? ?Dispostion / Final MDM: ? ?After consideration of the diagnostic results and the patient's response to treatment, following several hours of resuscitation with fluids, continuous insulin the patient had a steady decline in his glucose level, down to 128.  Continuous insulin was stopped, and after monitoring patient continued to have euglycemia.  Absent evidence for DKA, hyperosmolar nonketotic state, given his own endorsement of not taking his medication regularly, and given his improvement here he was restarted on metformin which he was previously taken, discharged in stable condition to follow-up one of our community care clinics. ?No evidence for concurrent infection, bacteremia. ?Hospitalization was a consideration given substantial hyperglycemia, given his improvement here with resuscitation he  is approved for discharge with outpatient follow-up. ? ?Final Clinical Impression(s) / ED Diagnoses ?Final diagnoses:  ?Hyperglycemia  ? ? ?  ?Gerhard Munch, MD ?11/18/21 1759 ? ?

## 2021-11-17 NOTE — ED Triage Notes (Signed)
Pt presents to ED with complaints of blood sugar over 600. Pt states they had a seminar at work and checked his sugar.  ?

## 2021-11-17 NOTE — Discharge Instructions (Signed)
It is very important that you resume taking your medication as prescribed.  Return here for concerning changes in your condition. ?

## 2021-12-17 IMAGING — CT CT ANGIO CHEST
3 of 16 series · 18 of 47 positions shown · IV contrast (omnipaque)
Comparison: None.

CLINICAL DATA: Shortness of breath and chest pain.

EXAM:
CT ANGIOGRAPHY CHEST WITH CONTRAST
TECHNIQUE: Multidetector CT imaging of the chest was performed using the
standard protocol during bolus administration of intravenous
contrast. Multiplanar CT image reconstructions and MIPs were
obtained to evaluate the vascular anatomy.
CONTRAST:  100mL OMNIPAQUE IOHEXOL 350 MG/ML SOLN

[Series 4: thins · axial · 0.75mm/px · z∈[+1211,+1392]mm · 6 of 255 slices shown (1 of 3)]
[im 37/255  lung]
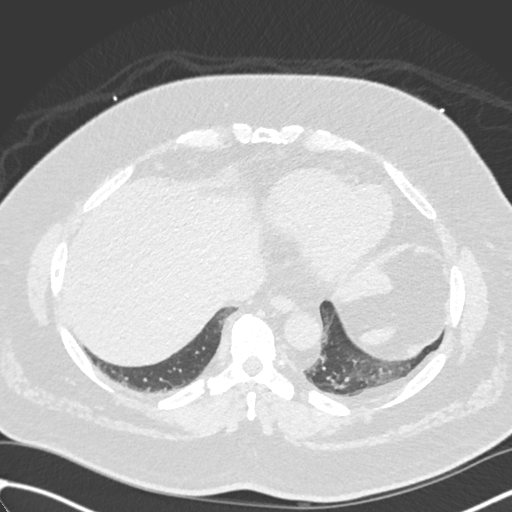
[im 73/255  lung]
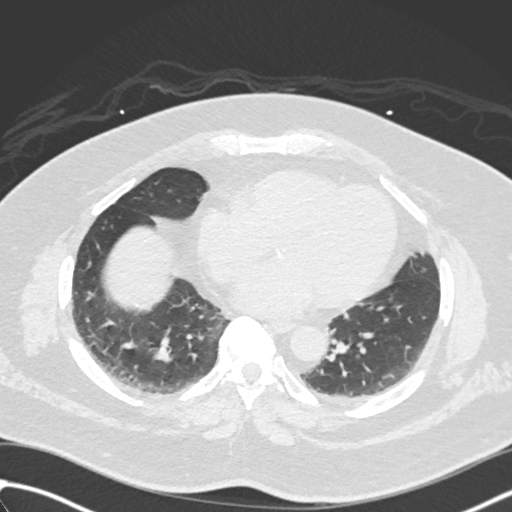
[im 109/255  lung]
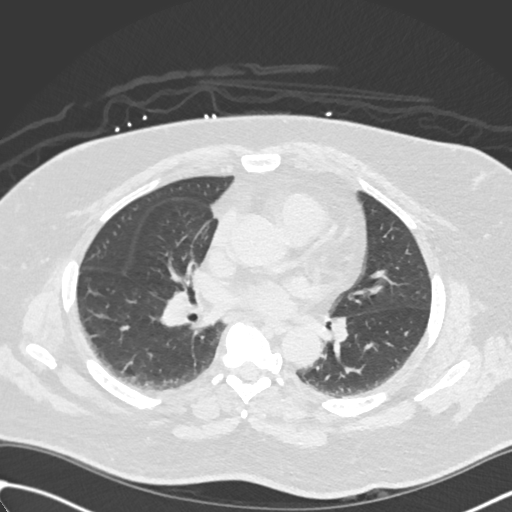
[im 146/255  lung]
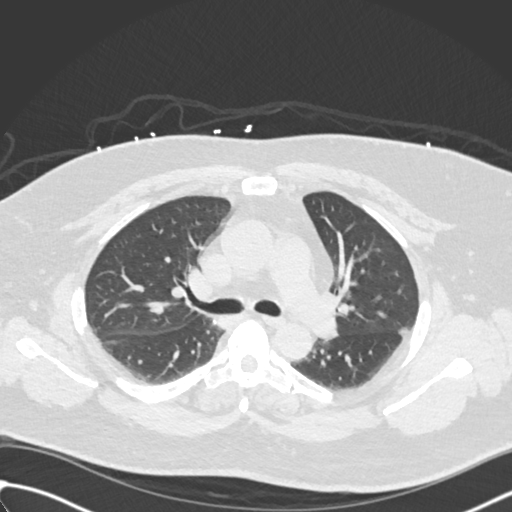
[im 182/255  lung]
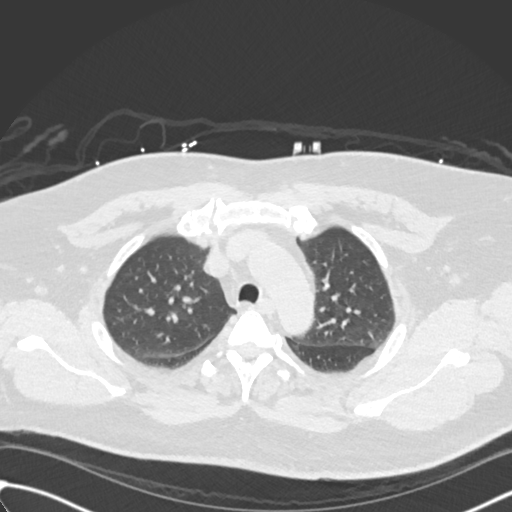
[im 218/255  lung]
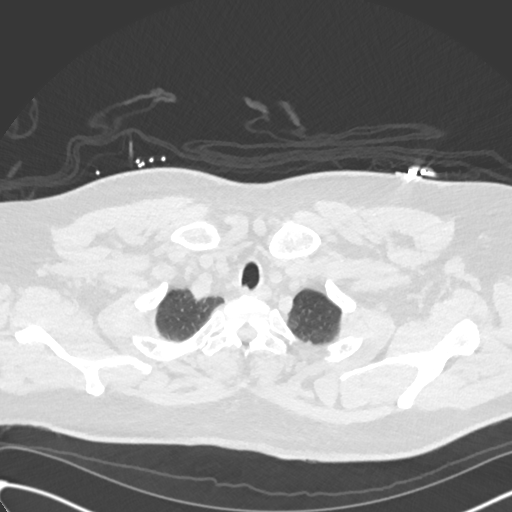

[Series 11: thins · axial · 0.76mm/px · z∈[+1188,+1385]mm · 6 of 277 slices shown (2 of 3)]
[im 40/277  lung]
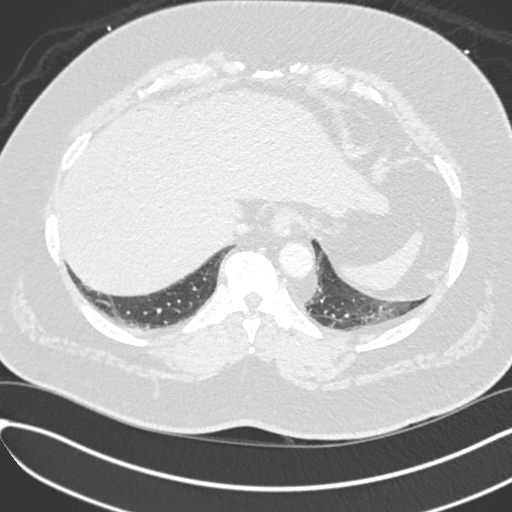
[im 79/277  soft-tissue]
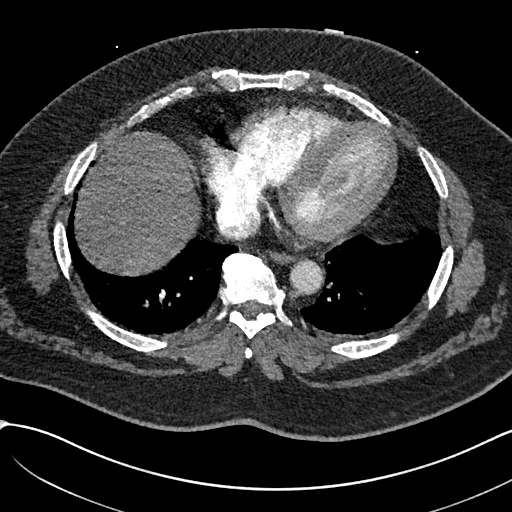
[im 119/277  lung]
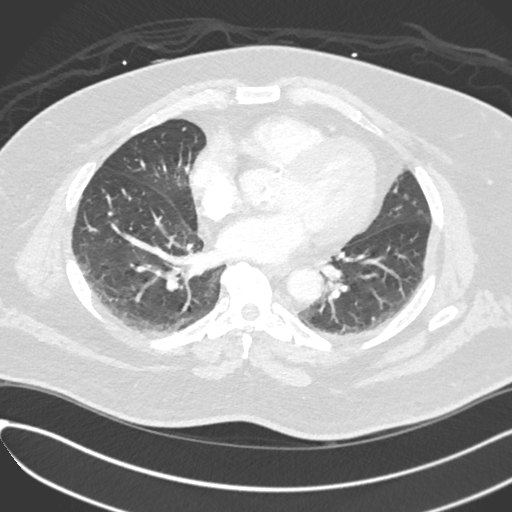
[im 158/277  soft-tissue]
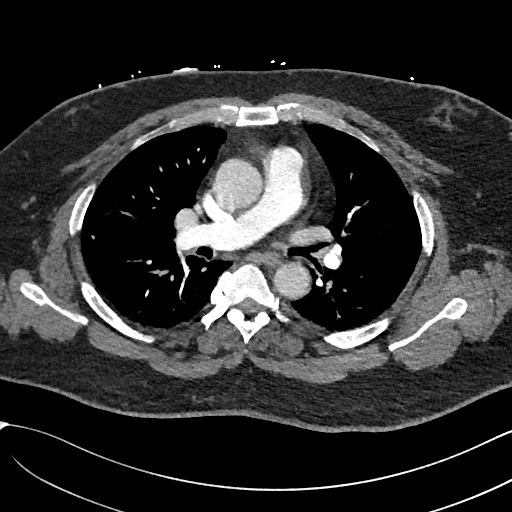
[im 198/277  lung]
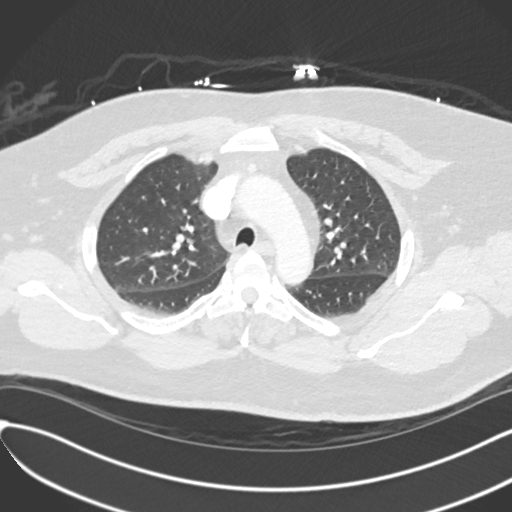
[im 237/277  soft-tissue]
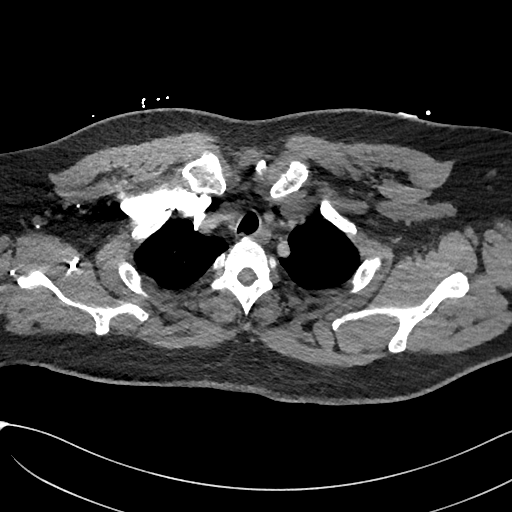

[Series 18: thins · axial · 0.76mm/px · z∈[+1188,+1385]mm · 6 of 277 slices shown (3 of 3)]
[im 40/277  lung]
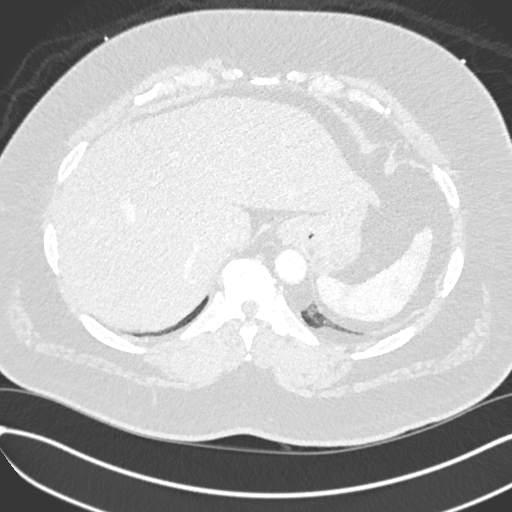
[im 79/277  lung]
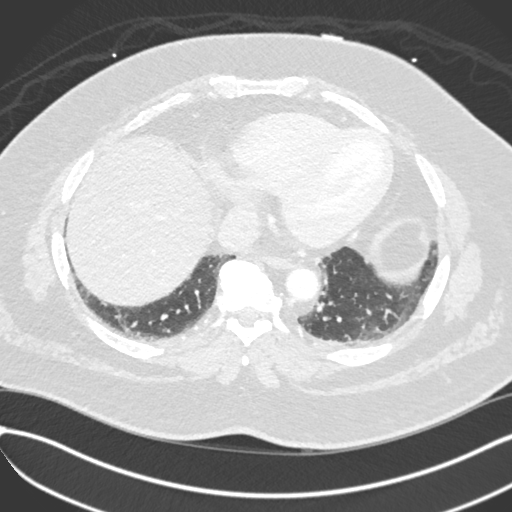
[im 119/277  lung]
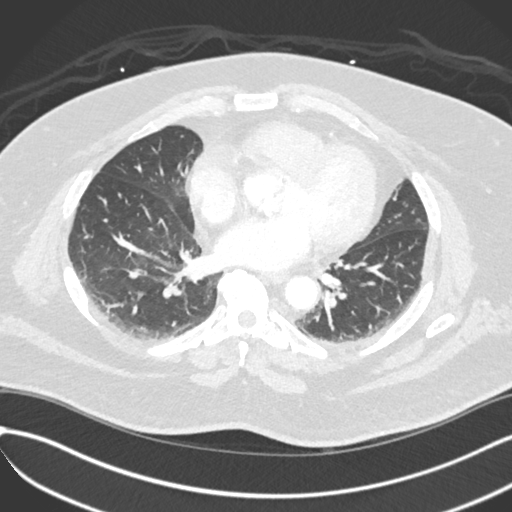
[im 158/277  lung]
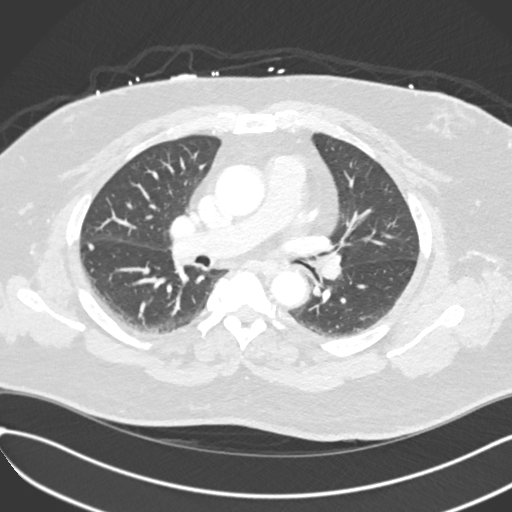
[im 198/277  lung]
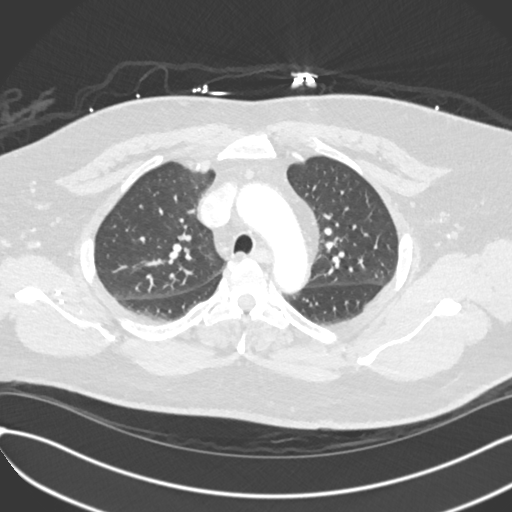
[im 237/277  lung]
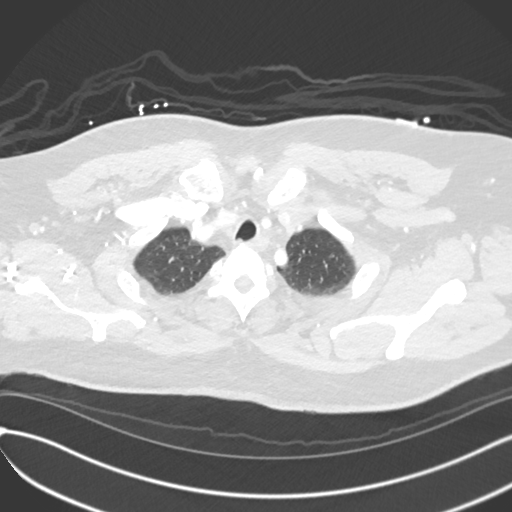

[18 of 47 positions shown; findings below may reference images not displayed]

FINDINGS: Cardiovascular: The heart is upper limits of normal in size. No
pericardial effusion. The aorta is normal in caliber. Moderate
calcifications are noted at the aortic valve. No focal aortic
aneurysm or aortic dissection. No aortic or coronary artery
calcifications are identified.

The pulmonary arterial tree is well opacified. No filling defects to
suggest pulmonary embolism.

Mediastinum/Nodes: No mediastinal or hilar mass or adenopathy. The
esophagus is grossly normal.

Lungs/Pleura: No acute pulmonary findings. No infiltrates or
effusions. No worrisome pulmonary lesions. There are few small
peripheral nodular densities and perifissural nodules which are
likely lymph nodes.

Upper Abdomen: No significant upper abdominal findings.

Musculoskeletal: No chest wall mass, supraclavicular or axillary
adenopathy. The thyroid gland appears normal. The bony thorax is
intact.

Review of the MIP images confirms the above findings.
IMPRESSION: 1. Normal caliber thoracic aorta and no findings for dissection or
atherosclerotic calcifications.
2. Moderate calcifications are noted at the aortic valve which could
lead to aortic stenosis. Cardiology consult may be reasonable.
3. No definite coronary artery calcifications.
4. No CT findings for pulmonary embolism.
5. No acute pulmonary findings.
6. A few scattered subpleural nodules and perifissural nodules,
likely benign lymph nodes. No follow-up needed if patient is
low-risk (and has no known or suspected primary neoplasm).
Non-contrast chest CT can be considered in 12 months if patient is
high-risk. This recommendation follows the consensus statement:
Guidelines for Management of Incidental Pulmonary Nodules Detected

## 2021-12-30 ENCOUNTER — Emergency Department (HOSPITAL_COMMUNITY)
Admission: EM | Admit: 2021-12-30 | Discharge: 2021-12-30 | Disposition: A | Discharge status: Home / Self Care | Payer: Medicare Other | Attending: Emergency Medicine | Admitting: Emergency Medicine

## 2021-12-30 ENCOUNTER — Emergency Department (HOSPITAL_COMMUNITY): Payer: Medicare Other

## 2021-12-30 ENCOUNTER — Encounter (HOSPITAL_COMMUNITY): Payer: Self-pay | Admitting: *Deleted

## 2021-12-30 ENCOUNTER — Other Ambulatory Visit: Payer: Self-pay

## 2021-12-30 DIAGNOSIS — I10 Essential (primary) hypertension: Secondary | ICD-10-CM | POA: Diagnosis not present

## 2021-12-30 DIAGNOSIS — E1165 Type 2 diabetes mellitus with hyperglycemia: Secondary | ICD-10-CM | POA: Insufficient documentation

## 2021-12-30 DIAGNOSIS — Z79899 Other long term (current) drug therapy: Secondary | ICD-10-CM | POA: Diagnosis not present

## 2021-12-30 DIAGNOSIS — E039 Hypothyroidism, unspecified: Secondary | ICD-10-CM | POA: Insufficient documentation

## 2021-12-30 DIAGNOSIS — R1031 Right lower quadrant pain: Secondary | ICD-10-CM | POA: Diagnosis present

## 2021-12-30 DIAGNOSIS — Z7984 Long term (current) use of oral hypoglycemic drugs: Secondary | ICD-10-CM | POA: Diagnosis not present

## 2021-12-30 DIAGNOSIS — Z7901 Long term (current) use of anticoagulants: Secondary | ICD-10-CM | POA: Diagnosis not present

## 2021-12-30 DIAGNOSIS — R109 Unspecified abdominal pain: Secondary | ICD-10-CM

## 2021-12-30 LAB — URINALYSIS, ROUTINE W REFLEX MICROSCOPIC
Bacteria, UA: NONE SEEN
Bilirubin Urine: NEGATIVE
Glucose, UA: >=500 mg/dL — AB
Hgb urine dipstick: NEGATIVE
Ketones, ur: 5 mg/dL — AB
Leukocytes,Ua: NEGATIVE
Nitrite: NEGATIVE
Protein, ur: NEGATIVE mg/dL
Specific Gravity, Urine: 1.03 (ref 1.005–1.030)
pH: 5 (ref 5.0–8.0)

## 2021-12-30 LAB — CBC WITH DIFFERENTIAL/PLATELET
Abs Immature Granulocytes: 0.01 10*3/uL (ref 0.00–0.07)
Basophils Absolute: 0 10*3/uL (ref 0.0–0.1)
Basophils Relative: 0 %
Eosinophils Absolute: 0 10*3/uL (ref 0.0–0.5)
Eosinophils Relative: 1 %
HCT: 44.1 % (ref 39.0–52.0)
Hemoglobin: 14.3 g/dL (ref 13.0–17.0)
Immature Granulocytes: 0 %
Lymphocytes Relative: 35 %
Lymphs Abs: 1.5 10*3/uL (ref 0.7–4.0)
MCH: 27.2 pg (ref 26.0–34.0)
MCHC: 32.4 g/dL (ref 30.0–36.0)
MCV: 84 fL (ref 80.0–100.0)
Monocytes Absolute: 0.3 10*3/uL (ref 0.1–1.0)
Monocytes Relative: 7 %
Neutro Abs: 2.4 10*3/uL (ref 1.7–7.7)
Neutrophils Relative %: 57 %
Platelets: 231 10*3/uL (ref 150–400)
RBC: 5.25 MIL/uL (ref 4.22–5.81)
RDW: 13.9 % (ref 11.5–15.5)
WBC: 4.3 10*3/uL (ref 4.0–10.5)
nRBC: 0 % (ref 0.0–0.2)

## 2021-12-30 LAB — COMPREHENSIVE METABOLIC PANEL
ALT: 21 U/L (ref 0–44)
AST: 14 U/L — ABNORMAL LOW (ref 15–41)
Albumin: 3.7 g/dL (ref 3.5–5.0)
Alkaline Phosphatase: 88 U/L (ref 38–126)
Anion gap: 6 (ref 5–15)
BUN: 11 mg/dL (ref 8–23)
CO2: 26 mmol/L (ref 22–32)
Calcium: 8.9 mg/dL (ref 8.9–10.3)
Chloride: 103 mmol/L (ref 98–111)
Creatinine, Ser: 0.8 mg/dL (ref 0.61–1.24)
GFR, Estimated: >60 mL/min (ref >60)
Glucose, Bld: 293 mg/dL — ABNORMAL HIGH (ref 70–99)
Potassium: 3.9 mmol/L (ref 3.5–5.1)
Sodium: 135 mmol/L (ref 135–145)
Total Bilirubin: 0.4 mg/dL (ref 0.3–1.2)
Total Protein: 6.8 g/dL (ref 6.5–8.1)

## 2021-12-30 LAB — LIPASE, BLOOD: Lipase: 23 U/L (ref 11–51)

## 2021-12-30 MED ORDER — KETOROLAC TROMETHAMINE 30 MG/ML IJ SOLN: 30.0000 mg | Freq: Once | INTRAMUSCULAR | Status: DC

## 2021-12-30 MED ORDER — METHOCARBAMOL 500 MG PO TABS: 500.0000 mg | ORAL_TABLET | Freq: Two times a day (BID) | ORAL | 0 refills | Status: AC

## 2021-12-30 MED ORDER — OXYCODONE-ACETAMINOPHEN 5-325 MG PO TABS
1.0000 | ORAL_TABLET | Freq: Once | ORAL | Status: AC
  Administered 2021-12-30: 1 via ORAL
  Filled 2021-12-30: qty 1

## 2021-12-30 NOTE — ED Triage Notes (Signed)
Pt c/o right flank pain x 4 days; pt states he has been urinating more frequently but he has been drinking more water ?

## 2021-12-30 NOTE — ED Notes (Signed)
Pt states he played golf on Monday and is unsure if he may have injured his back while playing ?

## 2021-12-30 NOTE — ED Provider Notes (Signed)
?Johnathan Richmond ?Provider Note ? ? ?CSN: [id number] ?Arrival date & time: 12/30/21  1227 ? ?  ? ?History ?PMH: Hypothyroidism, DM, Obesity, HTN, Aortic Stenosis, Atrial Flutter, hx cholecystectomy ?Chief Complaint  ?Patient presents with  ? Flank Pain  ? ? ?KENWOOD Richmond is a 61 y.o. male. ?Patient presents with a chief complaint of right flank pain.  He says the symptoms started on Monday which was 3 days ago.  They started gradual and is gradually gotten worse.  He says he was playing golf when he noticed this occurring.  He does not remember a specific incidence of bending over or anything like that that exasperated the symptoms.  He states that the pain is constant.  It is 8 out of 10.  He describes it as a stabbing pain.  Nothing is made it better or worse.  He has tried Goody's powder without any relief.  He denies any dysuria, hematuria but does state he has had increased urination.  No hx Of kidney stones, pyelonephritis, or urinary tract infections.  He is not a smoker.  No history of AAA.  No testicular pain.  No fevers or chills.  No abdominal pain, nausea, or vomiting.  No diarrhea or constipation.  He has never had the symptoms before.  He is on Eliquis therapy ? ? ?Flank Pain ? ? ?  ? ?Home Medications ?Prior to Admission medications   ?Medication Sig Start Date End Date Taking? Authorizing Provider  ?methocarbamol (ROBAXIN) 500 MG tablet Take 1 tablet (500 mg total) by mouth 2 (two) times daily for 7 days. 12/30/21 01/06/22 Yes Katsumi Wisler, Finis Bud, PA-C  ?apixaban (ELIQUIS) 5 MG TABS tablet Take 1 tablet (5 mg total) by mouth 2 (two) times daily. 11/17/21   Gerhard Munch, MD  ?diltiazem (CARDIZEM CD) 120 MG 24 hr capsule Take 1 capsule (120 mg total) by mouth daily. 11/17/21   Gerhard Munch, MD  ?fexofenadine (ALLEGRA) 60 MG tablet Take 1 tablet (60 mg total) by mouth daily. ?Patient not taking: Reported on 11/17/2021 10/19/21   Rhys Martini, PA-C  ?meclizine (ANTIVERT) 25 MG tablet  Take 1 tablet (25 mg total) by mouth 3 (three) times daily as needed for dizziness. ?Patient not taking: Reported on 11/17/2021 11/21/20   Glynn Octave, MD  ?metFORMIN (GLUCOPHAGE) 500 MG tablet Take 1 tablet (500 mg total) by mouth 2 (two) times daily with a meal. 11/17/21   Gerhard Munch, MD  ?pantoprazole (PROTONIX) 40 MG tablet Take 1 tablet (40 mg total) by mouth daily. 11/17/21   Gerhard Munch, MD  ?   ? ?Allergies    ?Patient has no known allergies.   ? ?Review of Systems   ?Review of Systems  ?Genitourinary:  Positive for flank pain.  ?All other systems reviewed and are negative. ? ?Physical Exam ?Updated Vital Signs ?BP (!) 146/90   Pulse 73   Temp 97.7 ?F (36.5 ?C) (Oral)   Resp 18   Ht 6\' 2"  (1.88 m)   Wt 122 kg   SpO2 100%   BMI 34.54 kg/m?  ?Physical Exam ?Vitals and nursing note reviewed.  ?Constitutional:   ?   General: He is not in acute distress. ?   Appearance: Normal appearance. He is obese. He is not ill-appearing, toxic-appearing or diaphoretic.  ?HENT:  ?   Head: Normocephalic and atraumatic.  ?   Nose: No nasal deformity.  ?   Mouth/Throat:  ?   Lips: Pink. No lesions.  ?  Mouth: No injury, lacerations, oral lesions or angioedema.  ?   Pharynx: Uvula midline. No pharyngeal swelling or uvula swelling.  ?Eyes:  ?   General: Gaze aligned appropriately. No scleral icterus.    ?   Right eye: No discharge.     ?   Left eye: No discharge.  ?   Conjunctiva/sclera: Conjunctivae normal.  ?   Right eye: Right conjunctiva is not injected. No exudate or hemorrhage. ?   Left eye: Left conjunctiva is not injected. No exudate or hemorrhage. ?Cardiovascular:  ?   Rate and Rhythm: Normal rate and regular rhythm.  ?   Pulses: Normal pulses.     ?     Radial pulses are 2+ on the right side and 2+ on the left side.  ?     Dorsalis pedis pulses are 2+ on the right side and 2+ on the left side.  ?   Heart sounds: S1 normal and S2 normal. Heart sounds not distant. Murmur heard.  ?  No friction rub. No  gallop. No S3 or S4 sounds.  ?Pulmonary:  ?   Effort: Pulmonary effort is normal. No accessory muscle usage or respiratory distress.  ?   Breath sounds: Normal breath sounds. No stridor. No wheezing, rhonchi or rales.  ?Chest:  ?   Chest wall: No tenderness.  ?Abdominal:  ?   General: Abdomen is flat. There is no distension.  ?   Palpations: Abdomen is soft. There is no mass or pulsatile mass.  ?   Tenderness: There is no abdominal tenderness. There is right CVA tenderness. There is no left CVA tenderness, guarding or rebound.  ?   Hernia: No hernia is present.  ?   Comments: Mild R CVA tenderness ?No pulsatile abdominal mass  ?Musculoskeletal:  ?   Right lower leg: No edema.  ?   Left lower leg: No edema.  ?Skin: ?   General: Skin is warm and dry.  ?   Coloration: Skin is not jaundiced or pale.  ?   Findings: No bruising, erythema, lesion or rash.  ?Neurological:  ?   General: No focal deficit present.  ?   Mental Status: He is alert and oriented to person, place, and time.  ?   GCS: GCS eye subscore is 4. GCS verbal subscore is 5. GCS motor subscore is 6.  ?Psychiatric:     ?   Mood and Affect: Mood normal.     ?   Behavior: Behavior normal. Behavior is cooperative.  ? ? ?ED Results / Procedures / Treatments   ?Labs ?(all labs ordered are listed, but only abnormal results are displayed) ?Labs Reviewed  ?URINALYSIS, ROUTINE W REFLEX MICROSCOPIC - Abnormal; Notable for the following components:  ?    Result Value  ? Glucose, UA >=500 (*)   ? Ketones, ur 5 (*)   ? All other components within normal limits  ?COMPREHENSIVE METABOLIC PANEL - Abnormal; Notable for the following components:  ? Glucose, Bld 293 (*)   ? AST 14 (*)   ? All other components within normal limits  ?CBC WITH DIFFERENTIAL/PLATELET  ?LIPASE, BLOOD  ? ? ?EKG ?None ? ?Radiology ?CT Renal Stone Study ? ?Result Date: 12/30/2021 ?CLINICAL DATA:  Right flank pain for 4 days. EXAM: CT ABDOMEN AND PELVIS WITHOUT CONTRAST TECHNIQUE: Multidetector CT imaging  of the abdomen and pelvis was performed following the standard protocol without IV contrast. RADIATION DOSE REDUCTION: This exam was performed according to the departmental dose-optimization  program which includes automated exposure control, adjustment of the mA and/or kV according to patient size and/or use of iterative reconstruction technique. COMPARISON:  11/25/2014 FINDINGS: Lower chest: No acute abnormality. Aortic valvular calcifications. Stable 7 mm right lower lobe pulmonary nodule unchanged compared with 11/25/2014. Stable 5 mm pulmonary nodule in the superior segment of the right lower lobe adjacent to the major fissure unchanged compared with 08/27/2019. No further evaluation recommended. Hepatobiliary: No focal liver abnormality is seen. Prior cholecystectomy. Pancreas: Unremarkable. No pancreatic ductal dilatation or surrounding inflammatory changes. Spleen: Normal in size without focal abnormality. Adrenals/Urinary Tract: Adrenal glands are unremarkable. Kidneys are normal, without renal calculi, focal lesion, or hydronephrosis. Bladder is unremarkable. Stomach/Bowel: Stomach is within normal limits. No evidence of bowel wall thickening, distention, or inflammatory changes. Appendix is normal. Mild haziness within the mesenteric fat unchanged compared with 11/25/2014. Vascular/Lymphatic: No significant vascular findings are present. No enlarged abdominal or pelvic lymph nodes. Reproductive: Prostate is unremarkable. Other: No abdominal wall hernia or abnormality. No abdominopelvic ascites. Musculoskeletal: No acute osseous abnormality. No aggressive osseous lesion. Osteoarthritis of bilateral SI joints. Degenerative disease with disc height loss at L5-S1 with bilateral facet arthropathy. IMPRESSION: 1. No acute abdominal or pelvic pathology. Electronically Signed   By: Elige Ko M.D.   On: 12/30/2021 14:09   ? ?Procedures ?Procedures  ? ?Medications Ordered in ED ?Medications   ?oxyCODONE-acetaminophen (PERCOCET/ROXICET) 5-325 MG per tablet 1 tablet (1 tablet Oral Given 12/30/21 1344)  ? ? ?ED Course/ Medical Decision Making/ A&P ?  ?                        ?Medical Decision Making ?Amount and/or Compl

## 2021-12-30 NOTE — Discharge Instructions (Addendum)
Back Pain: ? ?Back pain is very common.  The pain often gets better over time.  The cause of back pain is usually not dangerous.  Most people can learn to manage their back pain on their own. ? ?However if you develop severe or worsening pain, low back pain with fever, numbness, weakness or inability to walk or urinate/stool, you should return to the ER immediately.  Please follow up with your doctor this week for a recheck if still having symptoms. ? ?Low back pain is discomfort in the lower back that may be due to injuries to muscles and ligaments around the spine.  Occasionally, it may be caused by a a problem to a part of the spine called a disc. The pain may last several days or a week;  However, most patients get completely well in 4 weeks. ? ?Medications are also useful to help with pain control.  A commonly prescribed medications includes acetaminophen.  This medication is generally safe, though you should not take more than 8 of the extra strength (500mg ) pills a day. ? ?Be aware that if you develop new symptoms, such as a fever, leg weakness, difficulty with or loss of control of your urine or bowels, abdominal pain, or more severe pain, you will need to seek medical attention and  / or return to the Emergency department.  ? ? ?Home Care ?Stay active.  Start with short walks on flat ground if you can.  Try to walk farther each day. ?Do not sit, drive or stand in one place for more than 30 minutes.  Do not stay in bed. ?Do not avoid exercise or work.  Activity can help your back heal faster. ?Be careful when you bend or lift an object.  Bend at your knees, keep the object close to you, and do not twist. ?Sleep on a firm mattress.  Lie on your side, and bend your knees.  If you lie on your back, put a pillow under your knees. ?Only take medicines as told by your doctor. ?Put ice on the injured area. ?Put ice in a plastic bag ?Place a towel between your skin and the bag ?Leave the ice on for 15-20 minutes,  3-4 times a day for the first 2-3 days. 210 After that, you can switch between ice and heat packs. ?Ask your doctor about back exercises or massage. ?Avoid feeling anxious or stressed.  Find good ways to deal with stress, such as exercise. ? ?Get Help Right Way If: ?Your pain does not go away with rest or medicine. ?Your pain does not go away in 1 week. ?You have new problems. ?You do not feel well. ?The pain spreads into your legs. ?You cannot control when you poop (bowel movement) or pee (urinate) ?You feel sick to your stomach (nauseous) or throw up (vomit) ?You have belly (abdominal) pain. ?You feel like you may pass out (faint). ?If you develop a fever. ? ?Make Sure you: ?Understand these instructions. ?Watch your condition ?Get help right away if you are not doing well or get worse. ? ?Managing Pain ? ?Pain after surgery or related to activity is often due to strain/injury to muscle, tendon, nerves and/or incisions.  This pain is usually short-term and will improve in a few months.  ? ?Many people find it helpful to do the following things TOGETHER to help speed the process of healing and to get back to regular activity more quickly: ? ?Avoid heavy physical activity ? no lifting greater than  20 pounds ?Do not ?push through? the pain.  Listen to your body and avoid positions and maneuvers than reproduce the pain ?Walking is okay as tolerated, but go slowly and stop when getting sore.  ?Remember: If it hurts to do it, then don?t do it! ?Take Anti-inflammatory medication  ?Take with food/snack around the clock for 1-2 weeks ?This helps the muscle and nerve tissues become less irritable and calm down faster ?Choose ONE of the following over-the-counter medications: ?Naproxen 220mg  tabs (ex. Aleve) 1-2 pills twice a day  ?Ibuprofen 200mg  tabs (ex. Advil, Motrin) 3-4 pills with every meal and just before bedtime ?Acetaminophen 500mg  tabs (Tylenol) 1-2 pills with every meal and just before bedtime ?Use a Heating pad  or Ice/Cold Pack ?4-6 times a day ?May use warm bath/hottub  or showers ?Try Gentle Massage and/or Stretching  ?at the area of pain many times a day ?stop if you feel pain - do not overdo it ? ?Try these steps together to help you body heal faster and avoid making things get worse.  Doing just one of these things may not be enough.   ? ?If you are not getting better after two weeks or are noticing you are getting worse, contact our office for further advice; we may need to re-evaluate you & see what other things we can do to help.. ? ? ?You were given a prescription for ROBAXIN which is a muscle relaxer.  You should not drive, work, consume alcohol, or operate machinery while taking this medication as it can make you very drowsy.   ? ?  ?

## 2022-06-21 ENCOUNTER — Encounter (HOSPITAL_COMMUNITY): Payer: Self-pay

## 2022-06-21 ENCOUNTER — Emergency Department (HOSPITAL_COMMUNITY)
Admission: EM | Admit: 2022-06-21 | Discharge: 2022-06-22 | Disposition: A | Discharge status: Home / Self Care | Payer: Medicare Other | Attending: Emergency Medicine | Admitting: Emergency Medicine

## 2022-06-21 ENCOUNTER — Other Ambulatory Visit: Payer: Self-pay

## 2022-06-21 DIAGNOSIS — R42 Dizziness and giddiness: Secondary | ICD-10-CM | POA: Diagnosis present

## 2022-06-21 DIAGNOSIS — E039 Hypothyroidism, unspecified: Secondary | ICD-10-CM | POA: Diagnosis not present

## 2022-06-21 DIAGNOSIS — I10 Essential (primary) hypertension: Secondary | ICD-10-CM | POA: Diagnosis not present

## 2022-06-21 DIAGNOSIS — E1165 Type 2 diabetes mellitus with hyperglycemia: Secondary | ICD-10-CM | POA: Insufficient documentation

## 2022-06-21 DIAGNOSIS — R739 Hyperglycemia, unspecified: Secondary | ICD-10-CM

## 2022-06-21 LAB — CBG MONITORING, ED
Glucose-Capillary: 125 mg/dL — ABNORMAL HIGH (ref 70–99)
Glucose-Capillary: 120 mg/dL — ABNORMAL HIGH (ref 70–99)
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
## 2022-06-22 NOTE — Discharge Instructions (Addendum)
You were evaluated in the Emergency Department and after careful evaluation, we did not find any emergent condition requiring admission or further testing in the hospital.  Your exam/testing today was overall reassuring.  Important that you follow-up with a primary care doctor for diabetes management.  Please return to the Emergency Department if you experience any worsening of your condition.  Thank you for allowing [place] to be a part of your care.

## 2022-06-22 NOTE — ED Provider Notes (Signed)
[facility] Emergency Department Provider Note MRN:  [id number]  Arrival date & time: 06/22/22     Chief Complaint   Blood Sugar Problem   History of Present Illness   Johnathan Richmond is a 61 y.o. year-old male with a history of diabetes presenting to the ED with chief complaint of blood sugar problem.  Blood sugar was over 400 this past Sunday, was told he cannot return to work until it is better.  Had some lightheadedness over the weekend.  Thinks maybe the blood sugar was high because he was eating some new candies that a friend gave him.  He changed his diet and now his blood sugar is better.  He has no other complaints at this time.  Review of Systems  A thorough review of systems was obtained and all systems are negative except as noted in the HPI and PMH.   Patient's Health History    Past Medical History:  Diagnosis Date   Aortic stenosis    Diabetes mellitus ([place])    Essential hypertension    Hypothyroidism    Morbid obesity (HCC)    Paroxysmal atrial flutter (HCC)     Past Surgical History:  Procedure Laterality Date   CHOLECYSTECTOMY      Family History  Problem Relation Age of Onset   Schizophrenia Mother    COPD Mother    Epilepsy Father    Schizophrenia Brother     Social History   Socioeconomic History   Marital status: Divorced    Spouse name: Not on file   Number of children: 0   Years of education: Not on file   Highest education level: Not on file  Occupational History   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  Tobacco Use   Smoking status: Never   Smokeless tobacco: Never  Vaping Use   Vaping Use: Never used  Substance and Sexual Activity   Alcohol use: No   Drug use: No   Sexual activity: Not on file  Other Topics Concern   Not on file  Social History Narrative   Not on file   Social Determinants of Health   Financial Resource Strain: Not on file  Food Insecurity: Not on file   Transportation Needs: Not on file  Physical Activity: Not on file  Stress: Not on file  Social Connections: Not on file  Intimate Partner Violence: Not on file     Physical Exam   Vitals:   06/21/22 2340 06/22/22 0030  BP: 126/67 118/65  Pulse: 71 70  Resp: 15 15  Temp:    SpO2: 98% 99%    CONSTITUTIONAL: Well-appearing, NAD NEURO/PSYCH:  Alert and oriented x 3, no focal deficits EYES:  eyes equal and reactive ENT/NECK:  no LAD, no JVD CARDIO: Regular rate, well-perfused, normal S1 and S2 PULM:  CTAB no wheezing or rhonchi GI/GU:  non-distended, non-tender MSK/SPINE:  No gross deformities, no edema SKIN:  no rash, atraumatic   *Additional and/or pertinent findings included in MDM below  Diagnostic and Interventional Summary    EKG Interpretation  Date/Time:  Tuesday June 21 2022 23:38:04 EDT Ventricular Rate:  73 PR Interval:  155 QRS Duration: 99 QT Interval:  365 QTC Calculation: 403 R Axis:   -60 Text Interpretation: Sinus rhythm Left anterior fascicular block Nonspecific T abnrm, anterolateral leads No significant change was found Confirmed by Gerlene Fee [phone]) on 06/22/2022 1:04:00 AM       Labs Reviewed  CBG MONITORING,  ED - Abnormal; Notable for the following components:      Result Value   Glucose-Capillary 125 (*)    All other components within normal limits  CBG MONITORING, ED - Abnormal; Notable for the following components:   Glucose-Capillary 120 (*)    All other components within normal limits    No orders to display    Medications - No data to display   Procedures  /  Critical Care Procedures  ED Course and Medical Decision Making  Initial Impression and Ddx Hyperglycemia few days ago, currently with reassuring blood sugars and no symptoms, normal vital signs, provided reassurance, no indication for further testing or admission, no emergent process, appropriate for discharge.  Past medical/surgical history that increases  complexity of ED encounter: Diabetes  Interpretation of Diagnostics Blood sugar 120  Patient Reassessment and Ultimate Disposition/Management     Discharge  Patient management required discussion with the following services or consulting groups:  None  Complexity of Problems Addressed Acute complicated illness or Injury  Additional Data Reviewed and Analyzed Further history obtained from: None  Additional Factors Impacting ED Encounter Risk None  Barth Kirks. Sedonia Small, [place] [email or web address]  Final Clinical Impressions(s) / ED Diagnoses     ICD-10-CM   1. Hyperglycemia  R73.9       ED Discharge Orders     None        Discharge Instructions Discussed with and Provided to Patient:    Discharge Instructions      You were evaluated in the Emergency Department and after careful evaluation, we did not find any emergent condition requiring admission or further testing in the hospital.  Your exam/testing today was overall reassuring.  Important that you follow-up with a primary care doctor for diabetes management.  Please return to the Emergency Department if you experience any worsening of your condition.  Thank you for allowing [place] to be a part of your care.       Maudie Flakes, MD 06/22/22 [phone]

## 2022-09-20 ENCOUNTER — Other Ambulatory Visit: Payer: Self-pay

## 2022-09-20 ENCOUNTER — Emergency Department (HOSPITAL_COMMUNITY): Payer: Medicare Other

## 2022-09-20 ENCOUNTER — Encounter (HOSPITAL_COMMUNITY): Payer: Self-pay

## 2022-09-20 ENCOUNTER — Emergency Department (HOSPITAL_COMMUNITY)
Admission: EM | Admit: 2022-09-20 | Discharge: 2022-09-20 | Disposition: A | Discharge status: Home / Self Care | Payer: Medicare Other | Attending: Emergency Medicine | Admitting: Emergency Medicine

## 2022-09-20 DIAGNOSIS — U071 COVID-19: Secondary | ICD-10-CM | POA: Diagnosis not present

## 2022-09-20 DIAGNOSIS — Z7901 Long term (current) use of anticoagulants: Secondary | ICD-10-CM | POA: Diagnosis not present

## 2022-09-20 DIAGNOSIS — Z7984 Long term (current) use of oral hypoglycemic drugs: Secondary | ICD-10-CM | POA: Insufficient documentation

## 2022-09-20 DIAGNOSIS — R600 Localized edema: Secondary | ICD-10-CM | POA: Insufficient documentation

## 2022-09-20 DIAGNOSIS — R059 Cough, unspecified: Secondary | ICD-10-CM | POA: Diagnosis present

## 2022-09-20 LAB — RESP PANEL BY RT-PCR (RSV, FLU A&B, COVID)  RVPGX2
Influenza A by PCR: NEGATIVE
Influenza B by PCR: NEGATIVE
Resp Syncytial Virus by PCR: NEGATIVE
SARS Coronavirus 2 by RT PCR: POSITIVE — AB

## 2022-09-20 MED ORDER — BENZONATATE 100 MG PO CAPS: 200.0000 mg | ORAL_CAPSULE | Freq: Three times a day (TID) | ORAL | 0 refills | Status: DC | PRN

## 2022-09-20 NOTE — ED Triage Notes (Signed)
Pt presents to ED with complaints of sore throat, cough, generalized body aches, fever started 3 days ago

## 2022-09-20 NOTE — Discharge Instructions (Signed)
You will need to stay home in isolation through Saturday, after which you may return to normal activities if your symptoms are improved.  I recommend taking ibuprofen or Tylenol for body aches, fever.  Rest and make sure you are drinking plenty of fluids.

## 2022-09-20 NOTE — ED Notes (Signed)
Pt reports generalized body aches and fatigue, s/s onset 09/18/2022

## 2022-09-20 NOTE — ED Provider Notes (Signed)
[facility] EMERGENCY DEPARTMENT Provider Note   CSN: [id number] Arrival date & time: 09/20/22  1406     History  Chief Complaint  Patient presents with   Cough    Johnathan Richmond is a 62 y.o. male  presenting with a 3 day history of uri type symptoms which includes sore throat, generalized body aches and subjective fever and nonproductive cough.  Symptoms do not include shortness of breath, chest pain,  Nausea, vomiting or diarrhea. His fever has been subjective.  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   The patient is immunized for covid (states he had initial covid vaccines, but not the most recent COVID update.  He is not flu vaccinated.  The history is provided by the patient.       Home Medications Prior to Admission medications   Medication Sig Start Date End Date Taking? Authorizing Provider  benzonatate (TESSALON) 100 MG capsule Take 2 capsules (200 mg total) by mouth 3 (three) times daily as needed. 09/20/22  Yes Emalee Knies, Almyra Free, PA-C  apixaban (ELIQUIS) 5 MG TABS tablet Take 1 tablet (5 mg total) by mouth 2 (two) times daily. 11/17/21   Carmin Muskrat, MD  diltiazem (CARDIZEM CD) 120 MG 24 hr capsule Take 1 capsule (120 mg total) by mouth daily. 11/17/21   Carmin Muskrat, MD  fexofenadine (ALLEGRA) 60 MG tablet Take 1 tablet (60 mg total) by mouth daily. Patient not taking: Reported on 11/17/2021 10/19/21   Hazel Sams, PA-C  meclizine (ANTIVERT) 25 MG tablet Take 1 tablet (25 mg total) by mouth 3 (three) times daily as needed for dizziness. Patient not taking: Reported on 11/17/2021 11/21/20   Ezequiel Essex, MD  metFORMIN (GLUCOPHAGE) 500 MG tablet Take 1 tablet (500 mg total) by mouth 2 (two) times daily with a meal. 11/17/21   Carmin Muskrat, MD  pantoprazole (PROTONIX) 40 MG tablet Take 1 tablet (40 mg total) by mouth daily. 11/17/21   Carmin Muskrat, MD      Allergies    Patient has no known allergies.     Review of Systems   Review of Systems  Constitutional:  Positive for fever.  HENT:  Positive for sore throat. Negative for congestion.   Eyes: Negative.   Respiratory:  Positive for cough. Negative for chest tightness and shortness of breath.   Cardiovascular:  Negative for chest pain, palpitations and leg swelling.  Gastrointestinal:  Negative for abdominal pain, nausea and vomiting.  Genitourinary: Negative.   Musculoskeletal:  Positive for myalgias. Negative for arthralgias, joint swelling and neck pain.  Skin: Negative.  Negative for rash and wound.  Neurological:  Negative for dizziness, weakness, light-headedness, numbness and headaches.  Psychiatric/Behavioral: Negative.      Physical Exam Updated Vital Signs BP (!) 157/84 (BP Location: Right Arm)   Pulse 81   Temp 98 F (36.7 C) (Oral)   Resp 18   Ht 6\' 2"  (1.88 m)   Wt 115.2 kg   SpO2 98%   BMI 32.61 kg/m  Physical Exam Constitutional:      Appearance: He is well-developed.  HENT:     Head: Normocephalic and atraumatic.     Right Ear: Tympanic membrane and ear canal normal.     Left Ear: Tympanic membrane and ear canal normal.     Nose: Mucosal edema present. No rhinorrhea.     Mouth/Throat:     Mouth: Mucous membranes are moist.     Pharynx:  Oropharynx is clear. Uvula midline. No oropharyngeal exudate or posterior oropharyngeal erythema.     Tonsils: No tonsillar abscesses.  Eyes:     Conjunctiva/sclera: Conjunctivae normal.  Cardiovascular:     Rate and Rhythm: Normal rate.     Heart sounds: Normal heart sounds.  Pulmonary:     Effort: Pulmonary effort is normal. No respiratory distress.     Breath sounds: No wheezing or rales.  Musculoskeletal:        General: Normal range of motion.     Cervical back: Normal range of motion.  Skin:    General: Skin is warm and dry.     Findings: No rash.  Neurological:     General: No focal deficit present.     Mental Status: He is alert and oriented to person,  place, and time.     ED Results / Procedures / Treatments   Labs (all labs ordered are listed, but only abnormal results are displayed) Labs Reviewed  RESP PANEL BY RT-PCR (RSV, FLU A&B, COVID)  RVPGX2 - Abnormal; Notable for the following components:      Result Value   SARS Coronavirus 2 by RT PCR POSITIVE (*)    All other components within normal limits    EKG None  Radiology DG Chest 2 View  Result Date: 09/20/2022 CLINICAL DATA:  Three day history of flu-like symptoms and back pain EXAM: CHEST - 2 VIEW COMPARISON:  Chest radiograph dated 10/07/2020 FINDINGS: Normal lung volumes. No focal consolidations. No pleural effusion or pneumothorax. The heart size and mediastinal contours are within normal limits. The visualized skeletal structures are unremarkable. IMPRESSION: No active cardiopulmonary disease. Electronically Signed   By: Darrin Nipper M.D.   On: 09/20/2022 16:20    Procedures Procedures    Medications Ordered in ED Medications - No data to display  ED Course/ Medical Decision Making/ A&P                             Medical Decision Making Patient presenting with flulike symptoms, he is in no respiratory distress, vital signs are stable.  Differential diagnosis including viral URI, COVID-19, influenza.  Pneumonia.  His chest x-ray is clear today, he is positive for COVID-19.  His symptoms started 3 days ago, we did discuss Paxlovid medication, pros and cons, patient defers treatment with this medication at this time.  Return precautions were outlined, home treatments were also outlined, anticipate as needed follow-up.  Amount and/or Complexity of Data Reviewed Labs: ordered.    Details: Positive for COVID Radiology: ordered and independent interpretation performed.    Details: Reviewed chest x-ray, agree with interpretation, no pneumonia.  Risk Prescription drug management.           Final Clinical Impression(s) / ED Diagnoses Final diagnoses:   [id number]    Rx / DC Orders ED Discharge Orders          Ordered    benzonatate (TESSALON) 100 MG capsule  3 times daily PRN        09/20/22 1714              Landis Martins 09/20/22 Andreas Ohm, MD 09/21/22 0020

## 2022-10-05 ENCOUNTER — Emergency Department (HOSPITAL_COMMUNITY)
Admission: EM | Admit: 2022-10-05 | Discharge: 2022-10-05 | Disposition: A | Discharge status: Home / Self Care | Payer: Medicare Other | Attending: Emergency Medicine | Admitting: Emergency Medicine

## 2022-10-05 ENCOUNTER — Encounter (HOSPITAL_COMMUNITY): Payer: Self-pay | Admitting: Emergency Medicine

## 2022-10-05 ENCOUNTER — Other Ambulatory Visit: Payer: Self-pay

## 2022-10-05 DIAGNOSIS — Z0279 Encounter for issue of other medical certificate: Secondary | ICD-10-CM | POA: Insufficient documentation

## 2022-10-05 DIAGNOSIS — Z1152 Encounter for screening for COVID-19: Secondary | ICD-10-CM | POA: Diagnosis not present

## 2022-10-05 DIAGNOSIS — Z7901 Long term (current) use of anticoagulants: Secondary | ICD-10-CM | POA: Diagnosis not present

## 2022-10-05 DIAGNOSIS — Z20822 Contact with and (suspected) exposure to covid-19: Secondary | ICD-10-CM

## 2022-10-05 LAB — RESP PANEL BY RT-PCR (RSV, FLU A&B, COVID)  RVPGX2
Influenza A by PCR: NEGATIVE
Influenza B by PCR: NEGATIVE
Resp Syncytial Virus by PCR: NEGATIVE
SARS Coronavirus 2 by RT PCR: NEGATIVE

## 2022-10-05 NOTE — ED Provider Notes (Signed)
[facility] Provider Note   CSN: [id number] Arrival date & time: 10/05/22  1004     History  Chief Complaint  Patient presents with   Employment Physical    Johnathan Richmond is a 62 y.o. male presenting essentially for a work note so he may return to work.  He was seen here on January 16 and diagnosed with COVID-19.  He has remained out of work until now and is scheduled to start his neck shift in 2 days.  He requires a note before his employer will let him return confirming that he is COVID-negative.  He denies any symptoms at this time, denies shortness of breath, fevers, feels well and ready to return to work.  The history is provided by the patient.       Home Medications Prior to Admission medications   Medication Sig Start Date End Date Taking? Authorizing Provider  apixaban (ELIQUIS) 5 MG TABS tablet Take 1 tablet (5 mg total) by mouth 2 (two) times daily. 11/17/21   Carmin Muskrat, MD  benzonatate (TESSALON) 100 MG capsule Take 2 capsules (200 mg total) by mouth 3 (three) times daily as needed. 09/20/22   Evalee Jefferson, PA-C  diltiazem (CARDIZEM CD) 120 MG 24 hr capsule Take 1 capsule (120 mg total) by mouth daily. 11/17/21   Carmin Muskrat, MD  fexofenadine (ALLEGRA) 60 MG tablet Take 1 tablet (60 mg total) by mouth daily. Patient not taking: Reported on 11/17/2021 10/19/21   Hazel Sams, PA-C  meclizine (ANTIVERT) 25 MG tablet Take 1 tablet (25 mg total) by mouth 3 (three) times daily as needed for dizziness. Patient not taking: Reported on 11/17/2021 11/21/20   Ezequiel Essex, MD  metFORMIN (GLUCOPHAGE) 500 MG tablet Take 1 tablet (500 mg total) by mouth 2 (two) times daily with a meal. 11/17/21   Carmin Muskrat, MD  pantoprazole (PROTONIX) 40 MG tablet Take 1 tablet (40 mg total) by mouth daily. 11/17/21   Carmin Muskrat, MD      Allergies    Patient has no known allergies.    Review of Systems   Review of Systems   Constitutional:  Negative for chills and fever.  HENT:  Negative for congestion and sore throat.   Eyes: Negative.   Respiratory:  Negative for chest tightness and shortness of breath.   Cardiovascular:  Negative for chest pain.  Gastrointestinal:  Negative for abdominal pain and nausea.  Genitourinary: Negative.   Musculoskeletal:  Negative for arthralgias, joint swelling and neck pain.  Skin: Negative.  Negative for rash and wound.  Neurological:  Negative for dizziness, weakness, light-headedness, numbness and headaches.  Psychiatric/Behavioral: Negative.    All other systems reviewed and are negative.   Physical Exam Updated Vital Signs BP (!) 171/79   Pulse 89   Temp 98.2 F (36.8 C) (Oral)   Resp 16   SpO2 96%  Physical Exam Vitals and nursing note reviewed.  Constitutional:      Appearance: Normal appearance. He is well-developed.  HENT:     Head: Normocephalic and atraumatic.  Eyes:     Conjunctiva/sclera: Conjunctivae normal.  Cardiovascular:     Rate and Rhythm: Normal rate and regular rhythm.  Pulmonary:     Effort: Pulmonary effort is normal.     Breath sounds: Normal breath sounds.  Musculoskeletal:        General: Normal range of motion.  Neurological:     General: No focal deficit present.  Mental Status: He is alert.  Psychiatric:        Mood and Affect: Mood normal.     ED Results / Procedures / Treatments   Labs (all labs ordered are listed, but only abnormal results are displayed) Labs Reviewed  RESP PANEL BY RT-PCR (RSV, FLU A&B, COVID)  RVPGX2    EKG None  Radiology No results found.  Procedures Procedures    Medications Ordered in ED Medications - No data to display  ED Course/ Medical Decision Making/ A&P                             Medical Decision Making Patient presenting after recovering from COVID-19 requiring a negative COVID test before he can return to work.  He is asymptomatic at this time, his COVID test today  is negative.  Patient is given a work note clearing him to return to work.  Amount and/or Complexity of Data Reviewed Labs: ordered.    Details: Respiratory panel is negative           Final Clinical Impression(s) / ED Diagnoses Final diagnoses:  COVID-19 virus RNA not detected    Rx / DC Orders ED Discharge Orders     None         Landis Martins 10/05/22 1601    Isla Pence, MD 10/06/22 [phone]

## 2022-10-05 NOTE — ED Triage Notes (Signed)
Pt here jan 16 and positive for covid. States his job will not let him come back until it is negative with a work note. Denies any symptoms. Nad.wants to be retested

## 2022-10-05 NOTE — Discharge Instructions (Signed)
You have tested negative for COVID today and it is now safe for you to return to work.

## 2022-11-24 ENCOUNTER — Encounter: Payer: Self-pay | Admitting: Emergency Medicine

## 2022-11-24 ENCOUNTER — Ambulatory Visit
Admission: EM | Admit: 2022-11-24 | Discharge: 2022-11-24 | Disposition: A | Discharge status: Home / Self Care | Payer: Medicare Other | Attending: Nurse Practitioner | Admitting: Nurse Practitioner

## 2022-11-24 DIAGNOSIS — R739 Hyperglycemia, unspecified: Secondary | ICD-10-CM

## 2022-11-24 DIAGNOSIS — Z760 Encounter for issue of repeat prescription: Secondary | ICD-10-CM

## 2022-11-24 LAB — POCT FASTING CBG KUC MANUAL ENTRY: POCT Glucose (KUC): 376 mg/dL — AB (ref 70–99)

## 2022-11-24 MED ORDER — METFORMIN HCL 500 MG PO TABS: 500.0000 mg | ORAL_TABLET | Freq: Two times a day (BID) | ORAL | 0 refills | Status: DC
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
## 2022-11-24 NOTE — Discharge Instructions (Addendum)
Take medication as prescribed. Continue to monitor your dietary intake.  This is important until your blood sugars become more controlled. Try to drink at least 8-10 8 ounce glasses of water daily. If your blood sugar becomes higher than 450, please follow-up in the emergency department for further evaluation, or if you develop worsening fatigue, lightheadedness, fever, chills, abdominal pain, or other concerns. We have provided you with information to follow-up with a local primary care office to establish care.  Please follow-up as soon as possible. Follow-up as needed.

## 2022-12-06 ENCOUNTER — Ambulatory Visit: Payer: Medicare Other | Admitting: Internal Medicine

## 2023-01-15 ENCOUNTER — Emergency Department (HOSPITAL_COMMUNITY)
Admission: EM | Admit: 2023-01-15 | Discharge: 2023-01-15 | Disposition: A | Discharge status: Home / Self Care | Payer: Medicare Other | Attending: Emergency Medicine | Admitting: Emergency Medicine

## 2023-01-15 ENCOUNTER — Other Ambulatory Visit: Payer: Self-pay

## 2023-01-15 DIAGNOSIS — H6122 Impacted cerumen, left ear: Secondary | ICD-10-CM | POA: Insufficient documentation

## 2023-01-15 DIAGNOSIS — Z7901 Long term (current) use of anticoagulants: Secondary | ICD-10-CM | POA: Diagnosis not present

## 2023-01-15 DIAGNOSIS — H9203 Otalgia, bilateral: Secondary | ICD-10-CM | POA: Diagnosis present

## 2023-01-15 MED ORDER — CARBAMIDE PEROXIDE 6.5 % OT SOLN
5.0000 [drp] | Freq: Once | OTIC | Status: AC
  Administered 2023-01-15: 5 [drp] via OTIC
  Filled 2023-01-15: qty 15

## 2023-01-15 NOTE — ED Provider Notes (Signed)
[place] EMERGENCY DEPARTMENT AT [facility]  Provider Note  CSN: [id number] Arrival date & time: 01/15/23 0140  History Chief Complaint  Patient presents with   Otalgia    Johnathan Richmond is a 62 y.o. male reports fullness in both ears for the last 4 days, some discomfort, no drainage. Feels dizzy when he wakes up in the morning for a few seconds but otherwise has been feeling fine without fever, congestion or cough.    Home Medications Prior to Admission medications   Medication Sig Start Date End Date Taking? Authorizing Provider  apixaban (ELIQUIS) 5 MG TABS tablet Take 1 tablet (5 mg total) by mouth 2 (two) times daily. 11/17/21   Gerhard Munch, MD  benzonatate (TESSALON) 100 MG capsule Take 2 capsules (200 mg total) by mouth 3 (three) times daily as needed. 09/20/22   Burgess Amor, PA-C  diltiazem (CARDIZEM CD) 120 MG 24 hr capsule Take 1 capsule (120 mg total) by mouth daily. 11/17/21   Gerhard Munch, MD  fexofenadine (ALLEGRA) 60 MG tablet Take 1 tablet (60 mg total) by mouth daily. Patient not taking: Reported on 11/17/2021 10/19/21   Rhys Martini, PA-C  meclizine (ANTIVERT) 25 MG tablet Take 1 tablet (25 mg total) by mouth 3 (three) times daily as needed for dizziness. Patient not taking: Reported on 11/17/2021 11/21/20   Glynn Octave, MD  metFORMIN (GLUCOPHAGE) 500 MG tablet Take 1 tablet (500 mg total) by mouth 2 (two) times daily with a meal. 11/24/22 12/24/22  Leath-Warren, Sadie Haber, NP  pantoprazole (PROTONIX) 40 MG tablet Take 1 tablet (40 mg total) by mouth daily. 11/17/21   Gerhard Munch, MD     Allergies    Patient has no known allergies.   Review of Systems   Review of Systems Please see HPI for pertinent positives and negatives  Physical Exam BP (!) 141/78   Pulse 86   Temp 98.7 F (37.1 C) (Oral)   Resp 18   Ht 6\' 2"  (1.88 m)   Wt 111.1 kg   SpO2 98%   BMI 31.46 kg/m   Physical Exam Vitals and nursing note reviewed.  HENT:      Head: Normocephalic.     Right Ear: Tympanic membrane and ear canal normal.     Left Ear: There is impacted cerumen.     Nose: Nose normal.  Eyes:     Extraocular Movements: Extraocular movements intact.  Pulmonary:     Effort: Pulmonary effort is normal.  Musculoskeletal:        General: Normal range of motion.     Cervical back: Neck supple.  Skin:    Findings: No rash (on exposed skin).  Neurological:     Mental Status: He is alert and oriented to person, place, and time.  Psychiatric:        Mood and Affect: Mood normal.     ED Results / Procedures / Treatments   EKG None  Procedures Procedures  Medications Ordered in the ED Medications  carbamide peroxide (DEBROX) 6.5 % OTIC (EAR) solution 5 drop (5 drops Left EAR Given 01/15/23 0242)    Initial Impression and Plan  Patient here with cerumen impaction on the left. Attempted to remove with a curette but only partially successful. Will have RN irrigate after debrox.   ED Course   Clinical Course as of 01/15/23 0331  Wynelle Link Jan 15, 2023  0330 RN has attempted ear irrigation with some results but wax remains on  TM too deep to remove in the ED. Recommend close outpatient ENT follow up for cerumen removal. RTED for any other concerns.  [CS]    Clinical Course User Index [CS] Pollyann Savoy, MD     MDM Rules/Calculators/A&P Medical Decision Making Problems Addressed: Impacted cerumen of left ear: acute illness or injury  Risk OTC drugs.     Final Clinical Impression(s) / ED Diagnoses Final diagnoses:  Impacted cerumen of left ear    Rx / DC Orders ED Discharge Orders     None        Pollyann Savoy, MD 01/15/23 [phone]

## 2023-01-15 NOTE — ED Triage Notes (Signed)
Pt with bilateral ear pain, fullness, and dizziness x3 days

## 2023-01-26 IMAGING — DX DG CHEST 1V PORT
1 series · 1 of 1 positions shown · non-contrast
Comparison: Radiograph and CT August 2019

CLINICAL DATA: Cough.

EXAM:
PORTABLE CHEST 1 VIEW

[chest ap grid]
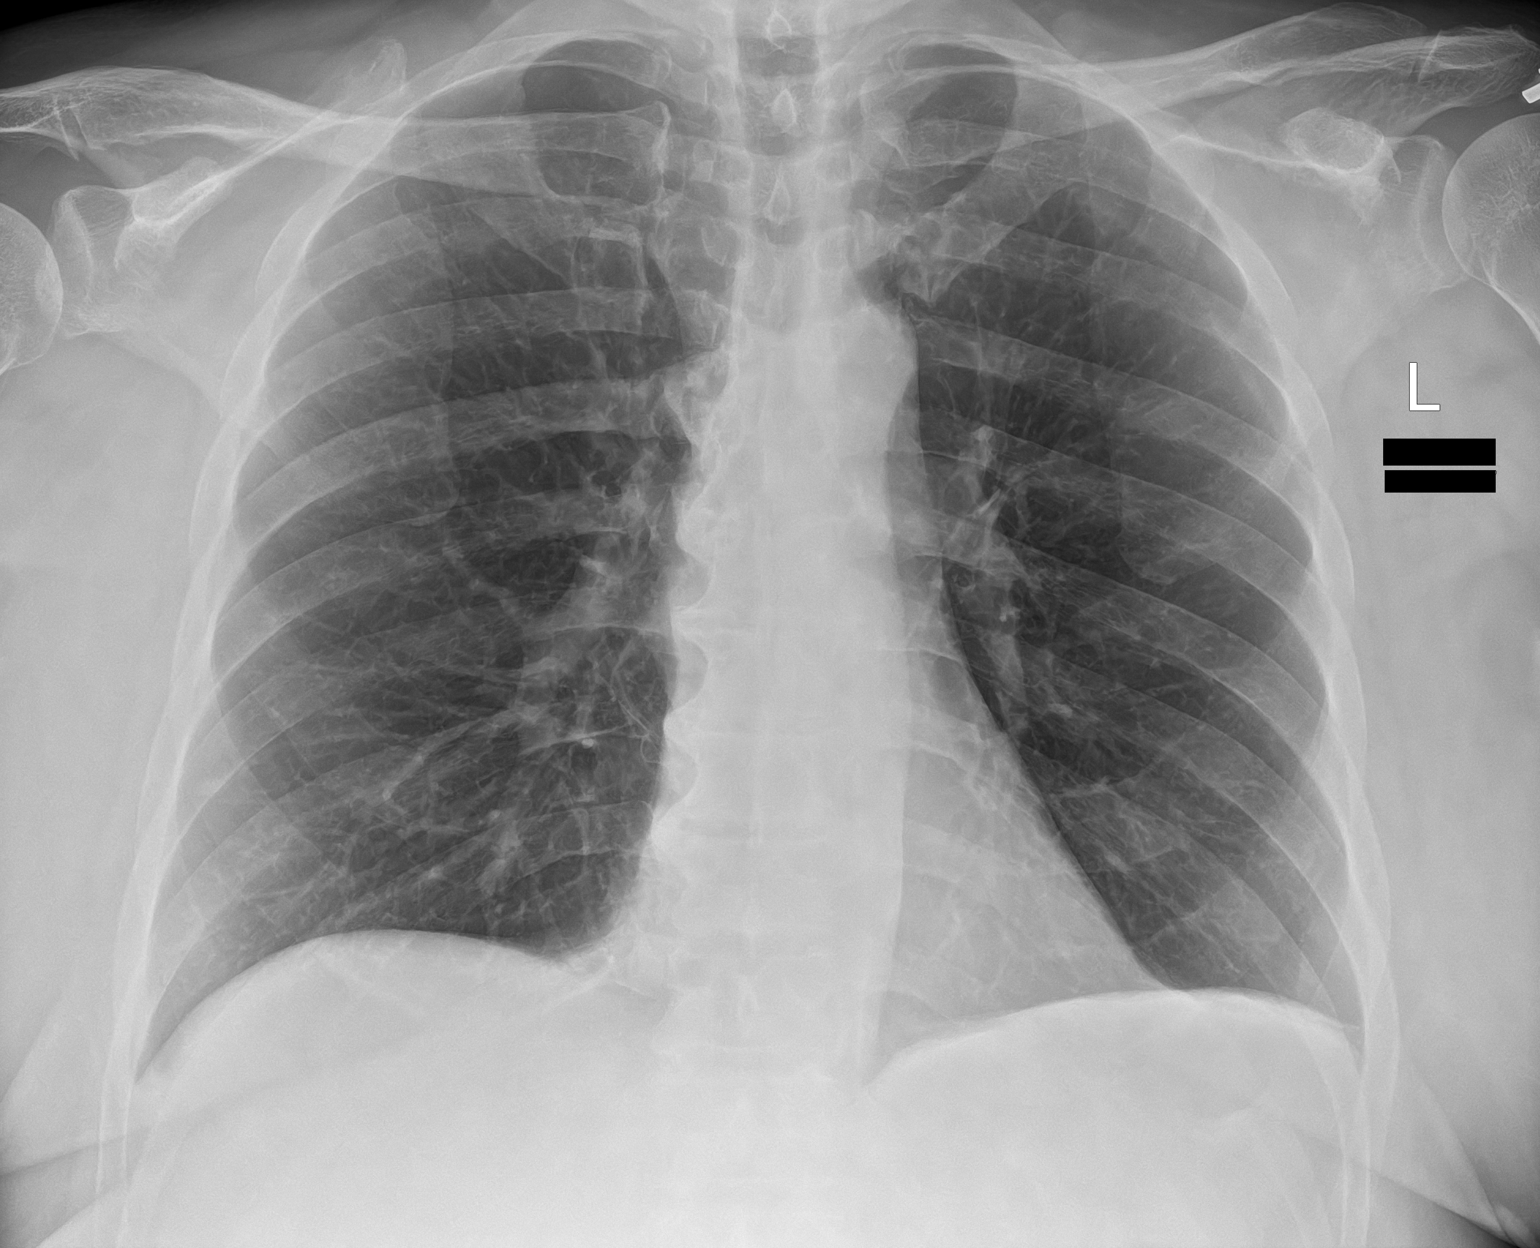

[1 of 1 positions shown; findings below may reference images not displayed]

FINDINGS: The cardiomediastinal contours are normal. The lungs are clear.
Pulmonary vasculature is normal. No consolidation, pleural effusion,
or pneumothorax. No acute osseous abnormalities are seen.
IMPRESSION: No acute chest findings.

## 2023-02-24 ENCOUNTER — Other Ambulatory Visit: Payer: Self-pay

## 2023-02-24 ENCOUNTER — Encounter: Payer: Self-pay | Admitting: Emergency Medicine

## 2023-02-24 ENCOUNTER — Ambulatory Visit
Admission: EM | Admit: 2023-02-24 | Discharge: 2023-02-24 | Disposition: A | Discharge status: Home / Self Care | Payer: Medicare Other | Attending: Nurse Practitioner | Admitting: Nurse Practitioner

## 2023-02-24 DIAGNOSIS — R42 Dizziness and giddiness: Secondary | ICD-10-CM | POA: Diagnosis not present

## 2023-02-24 DIAGNOSIS — Z7984 Long term (current) use of oral hypoglycemic drugs: Secondary | ICD-10-CM | POA: Diagnosis not present

## 2023-02-24 DIAGNOSIS — E1165 Type 2 diabetes mellitus with hyperglycemia: Secondary | ICD-10-CM

## 2023-02-24 LAB — POCT FASTING CBG KUC MANUAL ENTRY: POCT Glucose (KUC): 370 mg/dL — AB (ref 70–99)

## 2023-02-24 NOTE — ED Triage Notes (Signed)
Pt reports episode of dizziness on Tuesday and had to leave work early and needs note. Pt denies dizziness at this time.

## 2023-02-24 NOTE — Discharge Instructions (Addendum)
Your blood sugar today was very elevated.  I am concerned this could be causing the lightheadedness.  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

## 2023-02-24 NOTE — ED Provider Notes (Addendum)
[facility]    CSN: [id number] Arrival date & time: 02/24/23  1336      History   Chief Complaint Chief Complaint  Patient presents with   Follow-up    HPI Johnathan Richmond is a 62 y.o. male.   Patient presents today with "lightheadedness" that occurred 2 days ago while at work.  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  Reports his boss did not want him working with heavy equipment while feeling lightheaded, so Johnathan Richmond sent him home.  Johnathan Richmond is requesting a work note today.  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  Reports the lightheadedness has completely resolved.  During the lightheadedness episode, patient denies dizziness,, chest pain, or shortness of breath.  No double vision, blurred vision, trouble with speaking, weakness, unsteady gait, or fall.  Patient denies recent head injury.  No nausea or vomiting associated with the lightheadedness.  Patient denies room spinning sensation.  Patient reports medical history significant for type 2 diabetes.  Also has history of atrial fibrillation.  Reports Johnathan Richmond does not currently have a PCP, is only taking metformin for his diabetes, reports at 1 time his blood sugar was "600" and Johnathan Richmond was on insulin, however Johnathan Richmond is no longer taking the insulin and does not currently have a PCP.  Reports Johnathan Richmond drank a regular soda while in our waiting room today.    Past Medical History:  Diagnosis Date   Aortic stenosis    Diabetes mellitus (HCC)    Essential hypertension    Hypothyroidism    Morbid obesity (HCC)    Paroxysmal atrial flutter (HCC)     Patient Active Problem List   Diagnosis Date Noted   Aortic stenosis 08/29/2019   History of atrial flutter 08/29/2019   Subclinical hypothyroidism 08/29/2019   Lower extremity edema 08/29/2019   Essential hypertension 08/29/2019   Type 2 diabetes mellitus without complication,  without long-term current use of insulin (HCC) 08/29/2019   Atypical chest pain 08/28/2019    Past Surgical History:  Procedure Laterality Date   CHOLECYSTECTOMY         Home Medications    Prior to Admission medications   Medication Sig Start Date End Date Taking? Authorizing Provider  apixaban (ELIQUIS) 5 MG TABS tablet Take 1 tablet (5 mg total) by mouth 2 (two) times daily. 11/17/21   Gerhard Munch, MD  benzonatate (TESSALON) 100 MG capsule Take 2 capsules (200 mg total) by mouth 3 (three) times daily as needed. 09/20/22   Burgess Amor, PA-C  diltiazem (CARDIZEM CD) 120 MG 24 hr capsule Take 1 capsule (120 mg total) by mouth daily. 11/17/21   Gerhard Munch, MD  fexofenadine (ALLEGRA) 60 MG tablet Take 1 tablet (60 mg total) by mouth daily. Patient not taking: Reported on 11/17/2021 10/19/21   Rhys Martini, PA-C  meclizine (ANTIVERT) 25 MG tablet Take 1 tablet (25 mg total) by mouth 3 (three) times daily as needed for dizziness. Patient not taking: Reported on 11/17/2021 11/21/20   Glynn Octave, MD  metFORMIN (GLUCOPHAGE) 500 MG tablet Take 1 tablet (500 mg total) by mouth 2 (two) times daily with a meal. 11/24/22 12/24/22  Leath-Warren, Sadie Haber, NP  pantoprazole (PROTONIX) 40 MG tablet Take 1 tablet (40 mg total) by mouth daily. 11/17/21   Gerhard Munch, MD    Family History Family History  Problem Relation Age  of Onset   Schizophrenia Mother    COPD Mother    Epilepsy Father    Schizophrenia Brother     Social History Social History   Tobacco Use   Smoking status: Never   Smokeless tobacco: Never  Vaping Use   Vaping Use: Never used  Substance Use Topics   Alcohol use: No   Drug use: No     Allergies   Patient has no known allergies.   Review of Systems Review of Systems Per HPI  Physical Exam Triage Vital Signs ED Triage Vitals [02/24/23 1448]  Enc Vitals Group     BP (!) 141/89     Pulse Rate 90     Resp 20     Temp 98 F (36.7 C)      Temp Source Oral     SpO2 97 %     Weight      Height      Head Circumference      Peak Flow      Pain Score 0     Pain Loc      Pain Edu?      Excl. in GC?    Orthostatic VS for the past 24 hrs:  BP- Lying Pulse- Lying BP- Sitting Pulse- Sitting BP- Standing at 0 minutes Pulse- Standing at 0 minutes  02/24/23 1517 141/80 88 137/75 93 132/67 99    Updated Vital Signs BP (!) 141/89 (BP Location: Right Arm)   Pulse 90   Temp 98 F (36.7 C) (Oral)   Resp 20   SpO2 97%   Visual Acuity Right Eye Distance:   Left Eye Distance:   Bilateral Distance:    Right Eye Near:   Left Eye Near:    Bilateral Near:     Physical Exam Vitals and nursing note reviewed.  Constitutional:      General: Johnathan Richmond is not in acute distress.    Appearance: Normal appearance. Johnathan Richmond is not ill-appearing, toxic-appearing or diaphoretic.  HENT:     Head: Normocephalic and atraumatic.     Right Ear: Tympanic membrane, ear canal and external ear normal. There is no impacted cerumen.     Left Ear: Tympanic membrane, ear canal and external ear normal. There is no impacted cerumen.     Nose: Nose normal. No congestion or rhinorrhea.     Mouth/Throat:     Mouth: Mucous membranes are moist.     Pharynx: Oropharynx is clear. No posterior oropharyngeal erythema.  Eyes:     General: No scleral icterus.    Extraocular Movements: Extraocular movements intact.     Pupils: Pupils are equal, round, and reactive to light.  Cardiovascular:     Rate and Rhythm: Normal rate and regular rhythm.  Pulmonary:     Effort: Pulmonary effort is normal. No respiratory distress.     Breath sounds: Normal breath sounds. No wheezing, rhonchi or rales.  Abdominal:     General: Abdomen is flat. Bowel sounds are normal. There is no distension.     Palpations: Abdomen is soft.     Tenderness: There is no abdominal tenderness. There is no right CVA tenderness, left CVA tenderness or guarding.  Musculoskeletal:     Cervical back:  Normal range of motion and neck supple. No rigidity or tenderness.  Lymphadenopathy:     Cervical: No cervical adenopathy.  Skin:    General: Skin is warm and dry.     Capillary Refill: Capillary refill takes less than 2  seconds.     Coloration: Skin is not jaundiced or pale.     Findings: No erythema.  Neurological:     General: No focal deficit present.     Mental Status: Johnathan Richmond is alert and oriented to person, place, and time.     Cranial Nerves: Cranial nerves 2-12 are intact.     Sensory: Sensation is intact.     Motor: No weakness.     Coordination: Coordination is intact. Coordination normal. Heel to [facility] Test normal. Rapid alternating movements normal.     Gait: Gait is intact. Gait normal.  Psychiatric:        Behavior: Behavior is cooperative.      UC Treatments / Results  Labs (all labs ordered are listed, but only abnormal results are displayed) Labs Reviewed  POCT FASTING CBG [facility] MANUAL ENTRY - Abnormal; Notable for the following components:      Result Value   POCT Glucose ([facility]) 370 (*)    All other components within normal limits    EKG   Radiology No results found.  Procedures Procedures (including critical care time)  Medications Ordered in UC Medications - No data to display  Initial Impression / Assessment and Plan / UC Course  I have reviewed the triage vital signs and the nursing notes.  Pertinent labs & imaging results that were available during my care of the patient were reviewed by me and considered in my medical decision making (see chart for details).   Patient is well-appearing, normotensive, afebrile, not tachycardic, not tachypneic, oxygenating well on room air.    1. Light-headed feeling 2. 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  All questions answered to their satisfaction.  The patient is in agreement to this plan.    Final Clinical Impressions(s) / UC Diagnoses   Final diagnoses:  Light-headed feeling  Type 2 diabetes mellitus with hyperglycemia, unspecified whether long term insulin use [facility])     Discharge Instructions      Your blood sugar today was very elevated.  I am concerned this could be causing the lightheadedness.  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      ED Prescriptions   None    PDMP not reviewed this encounter.   Valentino Nose, NP 02/24/23 1543    Valentino Nose, NP 02/24/23 1544

## 2023-02-24 NOTE — ED Notes (Signed)
Attempted to make appt for pt on [place] Website for primary care in [place]. Website reported for pt to call office and unable to schedule online. Pt calling office while at UC. NP aware.

## 2023-07-06 ENCOUNTER — Ambulatory Visit
Admission: EM | Admit: 2023-07-06 | Discharge: 2023-07-06 | Disposition: A | Discharge status: Home / Self Care | Payer: Medicare Other | Attending: Nurse Practitioner | Admitting: Nurse Practitioner

## 2023-07-06 ENCOUNTER — Encounter: Payer: Self-pay | Admitting: Emergency Medicine

## 2023-07-06 DIAGNOSIS — J069 Acute upper respiratory infection, unspecified: Secondary | ICD-10-CM

## 2023-07-06 MED ORDER — BENZONATATE 100 MG PO CAPS: 100.0000 mg | ORAL_CAPSULE | Freq: Three times a day (TID) | ORAL | 0 refills | Status: DC | PRN

## 2023-07-06 NOTE — Discharge Instructions (Signed)
You have a viral upper respiratory infection.  Symptoms should improve over the next week to 10 days.  If you develop chest pain or shortness of breath, go to the emergency room.  Some things that can make you feel better are: - Increased rest - Increasing fluid with water/sugar free electrolytes - Acetaminophen and ibuprofen as needed for fever/pain - Salt water gargling, chloraseptic spray and throat lozenges for sore throat - OTC guaifenesin (Mucinex) 600 mg twice daily for congestion - Saline sinus flushes or a neti pot - Humidifying the air -Tessalon Perles every 8 hours as needed for dry cough

## 2023-07-06 NOTE — ED Provider Notes (Signed)
[facility]    CSN: [id number] Arrival date & time: 07/06/23  1342      History   Chief Complaint No chief complaint on file.   HPI Johnathan Richmond is a 62 y.o. male.   Patient presents with 5-day history of cough, congestion, slight sore throat, and headache.  No fever, body aches or chills, runny or stuffy nose, abdominal pain, nausea/vomiting, or diarrhea.  Reports appetite was slightly decreased at first, nausea is better.  Reports his brother was sick with similar symptoms prior to his symptoms starting.  He is requesting a note for work.    Past Medical History:  Diagnosis Date   Aortic stenosis    Diabetes mellitus (HCC)    Essential hypertension    Hypothyroidism    Morbid obesity (HCC)    Paroxysmal atrial flutter (HCC)     Patient Active Problem List   Diagnosis Date Noted   Aortic stenosis 08/29/2019   History of atrial flutter 08/29/2019   Subclinical hypothyroidism 08/29/2019   Lower extremity edema 08/29/2019   Essential hypertension 08/29/2019   Type 2 diabetes mellitus without complication, without long-term current use of insulin (HCC) 08/29/2019   Atypical chest pain 08/28/2019    Past Surgical History:  Procedure Laterality Date   CHOLECYSTECTOMY         Home Medications    Prior to Admission medications   Medication Sig Start Date End Date Taking? Authorizing Provider  benzonatate (TESSALON) 100 MG capsule Take 1 capsule (100 mg total) by mouth 3 (three) times daily as needed for cough. Do not take with alcohol or while driving or operating heavy machinery.  May cause drowsiness. 07/06/23  Yes Valentino Nose, NP  apixaban (ELIQUIS) 5 MG TABS tablet Take 1 tablet (5 mg total) by mouth 2 (two) times daily. 11/17/21   Gerhard Munch, MD  diltiazem (CARDIZEM CD) 120 MG 24 hr capsule Take 1 capsule (120 mg total) by mouth daily. 11/17/21   Gerhard Munch, MD  fexofenadine (ALLEGRA) 60 MG tablet Take 1 tablet (60 mg total) by  mouth daily. Patient not taking: Reported on 11/17/2021 10/19/21   Rhys Martini, PA-C  meclizine (ANTIVERT) 25 MG tablet Take 1 tablet (25 mg total) by mouth 3 (three) times daily as needed for dizziness. Patient not taking: Reported on 11/17/2021 11/21/20   Glynn Octave, MD  metFORMIN (GLUCOPHAGE) 500 MG tablet Take 1 tablet (500 mg total) by mouth 2 (two) times daily with a meal. 11/24/22 12/24/22  Leath-Warren, Sadie Haber, NP  pantoprazole (PROTONIX) 40 MG tablet Take 1 tablet (40 mg total) by mouth daily. 11/17/21   Gerhard Munch, MD    Family History Family History  Problem Relation Age of Onset   Schizophrenia Mother    COPD Mother    Epilepsy Father    Schizophrenia Brother     Social History Social History   Tobacco Use   Smoking status: Never   Smokeless tobacco: Never  Vaping Use   Vaping status: Never Used  Substance Use Topics   Alcohol use: No   Drug use: No     Allergies   Patient has no known allergies.   Review of Systems Review of Systems Per HPI  Physical Exam Triage Vital Signs ED Triage Vitals  Encounter Vitals Group     BP 07/06/23 1348 (!) 142/70     Systolic BP Percentile --      Diastolic BP Percentile --      Pulse  Rate 07/06/23 1348 84     Resp 07/06/23 1348 18     Temp 07/06/23 1348 97.7 F (36.5 C)     Temp Source 07/06/23 1348 Oral     SpO2 07/06/23 1348 97 %     Weight --      Height --      Head Circumference --      Peak Flow --      Pain Score 07/06/23 1351 0     Pain Loc --      Pain Education --      Exclude from Growth Chart --    No data found.  Updated Vital Signs BP (!) 142/70 (BP Location: Right Arm)   Pulse 84   Temp 97.7 F (36.5 C) (Oral)   Resp 18   SpO2 97%   Visual Acuity Right Eye Distance:   Left Eye Distance:   Bilateral Distance:    Right Eye Near:   Left Eye Near:    Bilateral Near:     Physical Exam Vitals and nursing note reviewed.  Constitutional:      General: He is not in  acute distress.    Appearance: Normal appearance. He is not ill-appearing or toxic-appearing.  HENT:     Head: Normocephalic and atraumatic.     Right Ear: Tympanic membrane, ear canal and external ear normal.     Left Ear: Tympanic membrane, ear canal and external ear normal.     Nose: Congestion present. No rhinorrhea.     Mouth/Throat:     Mouth: Mucous membranes are moist.     Pharynx: Oropharynx is clear. Posterior oropharyngeal erythema present. No oropharyngeal exudate.  Eyes:     General: No scleral icterus.    Extraocular Movements: Extraocular movements intact.  Cardiovascular:     Rate and Rhythm: Normal rate and regular rhythm.  Pulmonary:     Effort: Pulmonary effort is normal. No respiratory distress.     Breath sounds: Normal breath sounds. No wheezing, rhonchi or rales.  Musculoskeletal:     Cervical back: Normal range of motion and neck supple.  Lymphadenopathy:     Cervical: No cervical adenopathy.  Skin:    General: Skin is warm and dry.     Coloration: Skin is not jaundiced or pale.     Findings: No erythema or rash.  Neurological:     Mental Status: He is alert and oriented to person, place, and time.  Psychiatric:        Behavior: Behavior is cooperative.      UC Treatments / Results  Labs (all labs ordered are listed, but only abnormal results are displayed) Labs Reviewed - No data to display  EKG   Radiology No results found.  Procedures Procedures (including critical care time)  Medications Ordered in UC Medications - No data to display  Initial Impression / Assessment and Plan / UC Course  I have reviewed the triage vital signs and the nursing notes.  Pertinent labs & imaging results that were available during my care of the patient were reviewed by me and considered in my medical decision making (see chart for details).   Patient is well-appearing, normotensive, afebrile, not tachycardic, not tachypneic, oxygenating well on room air.     1. Viral URI with cough Vitals and exam are reassuring today Supportive care discussed Start cough suppressant medication Work excuse provided and isolation criteria discussed with patient  The patient was given the opportunity to ask questions.  All questions answered to their satisfaction.  The patient is in agreement to this plan.   Final Clinical Impressions(s) / UC Diagnoses   Final diagnoses:  Viral URI with cough     Discharge Instructions      You have a viral upper respiratory infection.  Symptoms should improve over the next week to 10 days.  If you develop chest pain or shortness of breath, go to the emergency room.  Some things that can make you feel better are: - Increased rest - Increasing fluid with water/sugar free electrolytes - Acetaminophen and ibuprofen as needed for fever/pain - Salt water gargling, chloraseptic spray and throat lozenges for sore throat - OTC guaifenesin (Mucinex) 600 mg twice daily for congestion - Saline sinus flushes or a neti pot - Humidifying the air -Tessalon Perles every 8 hours as needed for dry cough     ED Prescriptions     Medication Sig Dispense Auth. Provider   benzonatate (TESSALON) 100 MG capsule Take 1 capsule (100 mg total) by mouth 3 (three) times daily as needed for cough. Do not take with alcohol or while driving or operating heavy machinery.  May cause drowsiness. 21 capsule Valentino Nose, NP      PDMP not reviewed this encounter.   Valentino Nose, NP 07/06/23 1414

## 2023-07-06 NOTE — ED Triage Notes (Signed)
States was sick with flu like symptoms  last Saturday, and needs a note to be able to return to work.  States continues to cough  has been taking dayquil and nyquil to treat symptoms

## 2023-08-14 ENCOUNTER — Ambulatory Visit
Admission: EM | Admit: 2023-08-14 | Discharge: 2023-08-14 | Disposition: A | Discharge status: Home / Self Care | Payer: Medicare Other | Attending: Family Medicine | Admitting: Family Medicine

## 2023-08-14 DIAGNOSIS — S39012A Strain of muscle, fascia and tendon of lower back, initial encounter: Secondary | ICD-10-CM | POA: Diagnosis not present

## 2023-08-14 MED ORDER — CYCLOBENZAPRINE HCL 10 MG PO TABS: 10.0000 mg | ORAL_TABLET | Freq: Three times a day (TID) | ORAL | 0 refills | Status: DC | PRN

## 2023-08-14 MED ORDER — DEXAMETHASONE SODIUM PHOSPHATE 10 MG/ML IJ SOLN
10.0000 mg | Freq: Once | INTRAMUSCULAR | Status: AC
  Administered 2023-08-14: 10 mg via INTRAMUSCULAR

## 2023-08-14 NOTE — Discharge Instructions (Addendum)
We have given you a steroid shot for pain and inflammation today and I have sent a muscle relaxer to the pharmacy.  You may continue using Tylenol, heat, massage, gentle stretches and avoid any exertional activity.

## 2023-08-14 NOTE — ED Provider Notes (Signed)
[facility]    CSN: [id number] Arrival date & time: 08/14/23  1329      History   Chief Complaint No chief complaint on file.   HPI Johnathan Richmond is a 62 y.o. male.   Patient presenting today with bilateral low back pain worse on the right after picking up a heavy pallet the other day.  States pain is worse with movement, particularly bending and lifting motions.  Denies radiation of pain down legs, numbness, tingling, bowel or bladder incontinence, saddle anesthesias, fevers, urinary symptoms.  Tried Tylenol with mild temporary benefit.    Past Medical History:  Diagnosis Date   Aortic stenosis    Diabetes mellitus (HCC)    Essential hypertension    Hypothyroidism    Morbid obesity (HCC)    Paroxysmal atrial flutter (HCC)     Patient Active Problem List   Diagnosis Date Noted   Aortic stenosis 08/29/2019   History of atrial flutter 08/29/2019   Subclinical hypothyroidism 08/29/2019   Lower extremity edema 08/29/2019   Essential hypertension 08/29/2019   Type 2 diabetes mellitus without complication, without long-term current use of insulin (HCC) 08/29/2019   Atypical chest pain 08/28/2019    Past Surgical History:  Procedure Laterality Date   CHOLECYSTECTOMY         Home Medications    Prior to Admission medications   Medication Sig Start Date End Date Taking? Authorizing Provider  cyclobenzaprine (FLEXERIL) 10 MG tablet Take 1 tablet (10 mg total) by mouth 3 (three) times daily as needed for muscle spasms. Do not drink alcohol or drive while taking this medication.  May cause drowsiness. 08/14/23  Yes Particia Nearing, PA-C  apixaban (ELIQUIS) 5 MG TABS tablet Take 1 tablet (5 mg total) by mouth 2 (two) times daily. 11/17/21   Gerhard Munch, MD  benzonatate (TESSALON) 100 MG capsule Take 1 capsule (100 mg total) by mouth 3 (three) times daily as needed for cough. Do not take with alcohol or while driving or operating heavy machinery.   May cause drowsiness. 07/06/23   Valentino Nose, NP  diltiazem (CARDIZEM CD) 120 MG 24 hr capsule Take 1 capsule (120 mg total) by mouth daily. 11/17/21   Gerhard Munch, MD  fexofenadine (ALLEGRA) 60 MG tablet Take 1 tablet (60 mg total) by mouth daily. Patient not taking: Reported on 11/17/2021 10/19/21   Rhys Martini, PA-C  meclizine (ANTIVERT) 25 MG tablet Take 1 tablet (25 mg total) by mouth 3 (three) times daily as needed for dizziness. Patient not taking: Reported on 11/17/2021 11/21/20   Glynn Octave, MD  metFORMIN (GLUCOPHAGE) 500 MG tablet Take 1 tablet (500 mg total) by mouth 2 (two) times daily with a meal. 11/24/22 12/24/22  Leath-Warren, Sadie Haber, NP  pantoprazole (PROTONIX) 40 MG tablet Take 1 tablet (40 mg total) by mouth daily. 11/17/21   Gerhard Munch, MD    Family History Family History  Problem Relation Age of Onset   Schizophrenia Mother    COPD Mother    Epilepsy Father    Schizophrenia Brother     Social History Social History   Tobacco Use   Smoking status: Never   Smokeless tobacco: Never  Vaping Use   Vaping status: Never Used  Substance Use Topics   Alcohol use: No   Drug use: No     Allergies   Patient has no known allergies.   Review of Systems Review of Systems Per HPI  Physical Exam Triage Vital  Signs ED Triage Vitals  Encounter Vitals Group     BP 08/14/23 1419 134/84     Systolic BP Percentile --      Diastolic BP Percentile --      Pulse Rate 08/14/23 1419 79     Resp 08/14/23 1419 17     Temp 08/14/23 1419 98.4 F (36.9 C)     Temp Source 08/14/23 1419 Oral     SpO2 08/14/23 1419 98 %     Weight --      Height --      Head Circumference --      Peak Flow --      Pain Score 08/14/23 1422 6     Pain Loc --      Pain Education --      Exclude from Growth Chart --    No data found.  Updated Vital Signs BP 134/84 (BP Location: Right Arm)   Pulse 79   Temp 98.4 F (36.9 C) (Oral)   Resp 17   SpO2 98%    Visual Acuity Right Eye Distance:   Left Eye Distance:   Bilateral Distance:    Right Eye Near:   Left Eye Near:    Bilateral Near:     Physical Exam Vitals and nursing note reviewed.  Constitutional:      Appearance: Normal appearance.  HENT:     Head: Atraumatic.  Eyes:     Extraocular Movements: Extraocular movements intact.     Conjunctiva/sclera: Conjunctivae normal.  Cardiovascular:     Rate and Rhythm: Normal rate and regular rhythm.  Pulmonary:     Effort: Pulmonary effort is normal.     Breath sounds: Normal breath sounds.  Musculoskeletal:        General: Tenderness present. No swelling or deformity. Normal range of motion.     Cervical back: Normal range of motion and neck supple.     Comments: No midline spinal tenderness to palpation diffusely.  Negative straight leg raise bilateral lower extremities.  Normal gait and range of motion.  Tenderness to palpation bilateral lateral lumbar musculature  Skin:    General: Skin is warm and dry.     Findings: No bruising or erythema.  Neurological:     General: No focal deficit present.     Mental Status: He is oriented to person, place, and time.     Motor: No weakness.     Gait: Gait normal.     Comments: Bilateral lower extremities neurovascularly intact  Psychiatric:        Mood and Affect: Mood normal.        Thought Content: Thought content normal.        Judgment: Judgment normal.      UC Treatments / Results  Labs (all labs ordered are listed, but only abnormal results are displayed) Labs Reviewed - No data to display  EKG   Radiology No results found.  Procedures Procedures (including critical care time)  Medications Ordered in UC Medications  dexamethasone (DECADRON) injection 10 mg (10 mg Intramuscular Given 08/14/23 1512)    Initial Impression / Assessment and Plan / UC Course  I have reviewed the triage vital signs and the nursing notes.  Pertinent labs & imaging results that were  available during my care of the patient were reviewed by me and considered in my medical decision making (see chart for details).     Consistent with lumbar strain.  Treat with IM Decadron, Flexeril, heat, soft,  stretches.  Work note given for light duty for the next 3 days.  Return for worsening symptoms.  Final Clinical Impressions(s) / UC Diagnoses   Final diagnoses:  Strain of lumbar region, initial encounter     Discharge Instructions      We have given you a steroid shot for pain and inflammation today and I have sent a muscle relaxer to the pharmacy.  You may continue using Tylenol, heat, massage, gentle stretches and avoid any exertional activity.    ED Prescriptions     Medication Sig Dispense Auth. Provider   cyclobenzaprine (FLEXERIL) 10 MG tablet Take 1 tablet (10 mg total) by mouth 3 (three) times daily as needed for muscle spasms. Do not drink alcohol or drive while taking this medication.  May cause drowsiness. 15 tablet Particia Nearing, [place]      PDMP not reviewed this encounter.   Particia Nearing, [place] 08/14/23 1706

## 2023-08-14 NOTE — ED Triage Notes (Signed)
Pt reports pain in the lower back recalls picking up a pallet at work the wrong way.

## 2023-10-31 ENCOUNTER — Ambulatory Visit
Admission: EM | Admit: 2023-10-31 | Discharge: 2023-10-31 | Disposition: A | Discharge status: Home / Self Care | Payer: Medicare Other | Attending: Nurse Practitioner | Admitting: Nurse Practitioner

## 2023-10-31 DIAGNOSIS — Z7984 Long term (current) use of oral hypoglycemic drugs: Secondary | ICD-10-CM | POA: Diagnosis not present

## 2023-10-31 DIAGNOSIS — E1165 Type 2 diabetes mellitus with hyperglycemia: Secondary | ICD-10-CM

## 2023-10-31 DIAGNOSIS — R739 Hyperglycemia, unspecified: Secondary | ICD-10-CM

## 2023-10-31 LAB — POCT URINALYSIS DIP (MANUAL ENTRY)
Bilirubin, UA: NEGATIVE
Blood, UA: NEGATIVE
Glucose, UA: >=1000 mg/dL — AB
Ketones, POC UA: NEGATIVE mg/dL
Leukocytes, UA: NEGATIVE
Nitrite, UA: NEGATIVE
Protein Ur, POC: NEGATIVE mg/dL
Spec Grav, UA: 1.02 (ref 1.010–1.025)
Urobilinogen, UA: 0.2 U/dL
pH, UA: 5 (ref 5.0–8.0)

## 2023-10-31 LAB — POCT FASTING CBG KUC MANUAL ENTRY: POCT Glucose (KUC): 382 mg/dL — AB (ref 70–99)

## 2023-10-31 MED ORDER — METFORMIN HCL 500 MG PO TABS: 500.0000 mg | ORAL_TABLET | Freq: Two times a day (BID) | ORAL | 0 refills | Status: DC

## 2023-10-31 NOTE — ED Provider Notes (Signed)
 [facility]    CSN: [id number] Arrival date & time: 10/31/23  1739      History   Chief Complaint No chief complaint on file.   HPI Johnathan Richmond is a 63 y.o. male.   The history is provided by the patient.   Patient presents today for evaluation of dizziness which he suspects was related to his blood sugar.  States he has been experiencing sluggishness and dizziness, and suspects that his blood sugar is elevated.  Patient states he does not have a PCP currently, he has not been prescribed his usual dosing of metformin in quite some time.  Patient states that he has been eating healthy.  States that he has also experienced weight loss.  Patient denies headache, chest pain, shortness of breath, abdominal pain, nausea, vomiting, urinary urgency, frequency, hesitancy, polydipsia, polyuria, or polyphagia.  Of note, patient's past medical history also includes aortic stenosis, and paroxysmal atrial flutter.  Past Medical History:  Diagnosis Date   Aortic stenosis    Diabetes mellitus (HCC)    Essential hypertension    Hypothyroidism    Morbid obesity (HCC)    Paroxysmal atrial flutter (HCC)     Patient Active Problem List   Diagnosis Date Noted   Aortic stenosis 08/29/2019   History of atrial flutter 08/29/2019   Subclinical hypothyroidism 08/29/2019   Lower extremity edema 08/29/2019   Essential hypertension 08/29/2019   Type 2 diabetes mellitus without complication, without long-term current use of insulin (HCC) 08/29/2019   Atypical chest pain 08/28/2019    Past Surgical History:  Procedure Laterality Date   CHOLECYSTECTOMY         Home Medications    Prior to Admission medications   Medication Sig Start Date End Date Taking? Authorizing Provider  metFORMIN (GLUCOPHAGE) 500 MG tablet Take 1 tablet (500 mg total) by mouth 2 (two) times daily with a meal. 10/31/23 11/30/23 Yes Leath-Warren, Sadie Haber, NP  apixaban (ELIQUIS) 5 MG TABS tablet Take 1  tablet (5 mg total) by mouth 2 (two) times daily. 11/17/21   Gerhard Munch, MD  benzonatate (TESSALON) 100 MG capsule Take 1 capsule (100 mg total) by mouth 3 (three) times daily as needed for cough. Do not take with alcohol or while driving or operating heavy machinery.  May cause drowsiness. 07/06/23   Valentino Nose, NP  cyclobenzaprine (FLEXERIL) 10 MG tablet Take 1 tablet (10 mg total) by mouth 3 (three) times daily as needed for muscle spasms. Do not drink alcohol or drive while taking this medication.  May cause drowsiness. 08/14/23   Particia Nearing, PA-C  diltiazem (CARDIZEM CD) 120 MG 24 hr capsule Take 1 capsule (120 mg total) by mouth daily. 11/17/21   Gerhard Munch, MD  fexofenadine (ALLEGRA) 60 MG tablet Take 1 tablet (60 mg total) by mouth daily. Patient not taking: Reported on 11/17/2021 10/19/21   Rhys Martini, PA-C  meclizine (ANTIVERT) 25 MG tablet Take 1 tablet (25 mg total) by mouth 3 (three) times daily as needed for dizziness. Patient not taking: Reported on 11/17/2021 11/21/20   Glynn Octave, MD  pantoprazole (PROTONIX) 40 MG tablet Take 1 tablet (40 mg total) by mouth daily. 11/17/21   Gerhard Munch, MD    Family History Family History  Problem Relation Age of Onset   Schizophrenia Mother    COPD Mother    Epilepsy Father    Schizophrenia Brother     Social History Social History   Tobacco Use  Smoking status: Never   Smokeless tobacco: Never  Vaping Use   Vaping status: Never Used  Substance Use Topics   Alcohol use: No   Drug use: No     Allergies   Patient has no known allergies.   Review of Systems Review of Systems Per HPI  Physical Exam Triage Vital Signs ED Triage Vitals  Encounter Vitals Group     BP 10/31/23 1751 (!) 145/87     Systolic BP Percentile --      Diastolic BP Percentile --      Pulse Rate 10/31/23 1751 92     Resp 10/31/23 1751 18     Temp 10/31/23 1751 97.9 F (36.6 C)     Temp Source 10/31/23  1751 Oral     SpO2 10/31/23 1757 96 %     Weight --      Height --      Head Circumference --      Peak Flow --      Pain Score 10/31/23 1752 0     Pain Loc --      Pain Education --      Exclude from Growth Chart --    No data found.  Updated Vital Signs BP (!) 145/87 (BP Location: Right Arm)   Pulse 92   Temp 97.9 F (36.6 C) (Oral)   Resp 18   SpO2 96%   Visual Acuity Right Eye Distance:   Left Eye Distance:   Bilateral Distance:    Right Eye Near:   Left Eye Near:    Bilateral Near:     Physical Exam Vitals and nursing note reviewed.  Constitutional:      General: He is not in acute distress.    Appearance: Normal appearance.  HENT:     Head: Normocephalic.  Eyes:     Extraocular Movements: Extraocular movements intact.     Conjunctiva/sclera: Conjunctivae normal.     Pupils: Pupils are equal, round, and reactive to light.  Cardiovascular:     Rate and Rhythm: Normal rate and regular rhythm.     Pulses: Normal pulses.     Heart sounds: Normal heart sounds.  Pulmonary:     Effort: Pulmonary effort is normal. No respiratory distress.     Breath sounds: Normal breath sounds. No stridor. No wheezing, rhonchi or rales.  Abdominal:     General: Bowel sounds are normal.     Palpations: Abdomen is soft.  Musculoskeletal:     Cervical back: Normal range of motion.  Skin:    General: Skin is warm and dry.  Neurological:     General: No focal deficit present.     Mental Status: He is alert and oriented to person, place, and time.     GCS: GCS eye subscore is 4. GCS verbal subscore is 5. GCS motor subscore is 6.     Cranial Nerves: Cranial nerves 2-12 are intact.     Sensory: Sensation is intact.     Motor: Motor function is intact.     Coordination: Coordination is intact.     Gait: Gait is intact.  Psychiatric:        Mood and Affect: Mood normal.        Behavior: Behavior normal.      UC Treatments / Results  Labs (all labs ordered are listed, but  only abnormal results are displayed) Labs Reviewed  POCT FASTING CBG [facility] MANUAL ENTRY - Abnormal; Notable for the following components:  Result Value   POCT Glucose ([facility]) 382 (*)    All other components within normal limits  POCT URINALYSIS DIP (MANUAL ENTRY) - Abnormal; Notable for the following components:   Glucose, UA >=1,000 (*)    All other components within normal limits    EKG   Radiology No results found.  Procedures Procedures (including critical care time)  Medications Ordered in UC Medications - No data to display  Initial Impression / Assessment and Plan / UC Course  I have reviewed the triage vital signs and the nursing notes.  Pertinent labs & imaging results that were available during my care of the patient were reviewed by me and considered in my medical decision making (see chart for details).  Patient presents for complaints of sluggishness and concerns for elevated blood glucose.  CBG 382, urine with greater than 1000 glucose.  Elevated blood glucose most likely the cause of patient's feeling of sluggishness.  Patient has been to this clinic numerous times requesting medications for his diabetes, he has been scheduled to see a PCP, but did not attend the appointment.  Lengthy discussion with patient regarding his diabetes regimen, and concerned that he is relying on urgent care for his diabetes medication.  Patient was again provided information to establish care with a PCP.  In the interim, metformin 500 mg prescribed until patient can establish care.  Patient was given strict ER follow-up precautions.  Patient was in agreement with this plan of care and verbalizes understanding.  All questions were answered.  Patient stable for discharge.  Final Clinical Impressions(s) / UC Diagnoses   Final diagnoses:  Hyperglycemia  Poorly controlled diabetes mellitus (HCC)     Discharge Instructions      Your blood glucose level was 382 today. Take medication as  prescribed. Increase fluids.  Make sure you are drinking at least 8-10 8 ounce glasses of water daily. Continue to monitor your blood glucose daily. Go to the emergency department immediately if you experience worsening dizziness, dry mouth, fatigue, chest pain, shortness of breath, difficulty breathing, increased thirst, or increased urination. Please follow-up with a primary care physician as soon as possible to establish care.  We have provided information to help you locate a physician in your desired area. Follow-up as needed.     ED Prescriptions     Medication Sig Dispense Auth. Provider   metFORMIN (GLUCOPHAGE) 500 MG tablet Take 1 tablet (500 mg total) by mouth 2 (two) times daily with a meal. 60 tablet Leath-Warren, Sadie Haber, NP      PDMP not reviewed this encounter.   Abran Cantor, NP 10/31/23 1819

## 2023-10-31 NOTE — ED Triage Notes (Signed)
 Pt reports he feels lightheaded, dizzy, and sluggish x 2 days   Doesn't have many needles  so last sugar check was x 4 days

## 2023-10-31 NOTE — Discharge Instructions (Addendum)
 Your blood glucose level was 382 today. Take medication as prescribed. Increase fluids.  Make sure you are drinking at least 8-10 8 ounce glasses of water daily. Continue to monitor your blood glucose daily. Go to the emergency department immediately if you experience worsening dizziness, dry mouth, fatigue, chest pain, shortness of breath, difficulty breathing, increased thirst, or increased urination. Please follow-up with a primary care physician as soon as possible to establish care.  We have provided information to help you locate a physician in your desired area. Follow-up as needed.

## 2023-11-04 DEATH — deceased

## 2023-11-21 ENCOUNTER — Encounter (HOSPITAL_COMMUNITY): Payer: Self-pay

## 2023-11-21 ENCOUNTER — Emergency Department (HOSPITAL_COMMUNITY)
Admission: EM | Admit: 2023-11-21 | Discharge: 2023-11-21 | Disposition: A | Discharge status: Home / Self Care | Payer: PRIVATE HEALTH INSURANCE | Attending: Emergency Medicine | Admitting: Emergency Medicine

## 2023-11-21 ENCOUNTER — Other Ambulatory Visit: Payer: Self-pay

## 2023-11-21 DIAGNOSIS — E119 Type 2 diabetes mellitus without complications: Secondary | ICD-10-CM | POA: Insufficient documentation

## 2023-11-21 DIAGNOSIS — I10 Essential (primary) hypertension: Secondary | ICD-10-CM | POA: Insufficient documentation

## 2023-11-21 DIAGNOSIS — H109 Unspecified conjunctivitis: Secondary | ICD-10-CM | POA: Diagnosis not present

## 2023-11-21 DIAGNOSIS — H5789 Other specified disorders of eye and adnexa: Secondary | ICD-10-CM | POA: Diagnosis present

## 2023-11-21 DIAGNOSIS — Z7984 Long term (current) use of oral hypoglycemic drugs: Secondary | ICD-10-CM | POA: Diagnosis not present

## 2023-11-21 DIAGNOSIS — Z7901 Long term (current) use of anticoagulants: Secondary | ICD-10-CM | POA: Diagnosis not present

## 2023-11-21 MED ORDER — FLUORESCEIN SODIUM 1 MG OP STRP
1.0000 | ORAL_STRIP | Freq: Once | OPHTHALMIC | Status: AC
  Administered 2023-11-21: 1 via OPHTHALMIC
  Filled 2023-11-21: qty 1

## 2023-11-21 MED ORDER — TETRACAINE HCL 0.5 % OP SOLN
1.0000 [drp] | Freq: Once | OPHTHALMIC | Status: AC
  Administered 2023-11-21: 1 [drp] via OPHTHALMIC
  Filled 2023-11-21: qty 4

## 2023-11-21 MED ORDER — ERYTHROMYCIN 5 MG/GM OP OINT
TOPICAL_OINTMENT | Freq: Once | OPHTHALMIC | Status: AC
  Filled 2023-11-21: qty 3.5

## 2023-11-21 MED ORDER — ERYTHROMYCIN 5 MG/GM OP OINT: 1.0000 | TOPICAL_OINTMENT | Freq: Four times a day (QID) | OPHTHALMIC | 0 refills | Status: AC

## 2023-11-21 MED ORDER — CETIRIZINE HCL 10 MG PO TABS: 10.0000 mg | ORAL_TABLET | Freq: Every day | ORAL | 0 refills | Status: DC

## 2023-11-21 NOTE — ED Notes (Signed)
 ED Provider at bedside.

## 2023-11-21 NOTE — ED Triage Notes (Signed)
 Pt reports "busting a blood vessel in left eye Saturday", denies pain, but says he stares at computer at night when at work and feels that he is straining eye

## 2023-11-21 NOTE — Discharge Instructions (Signed)
 You are seen for irritation of your eyes today.  Your vision was normal, I did not see any obvious scrapes on the surface of your eye.  Many things can cause eye redness including infection with bacteria, viruses and irritation from allergies.  We are treating with a topical antibiotic ointment in case this is a bacterial cause that is more likely a virus or allergies.  I am starting you on allergy medicine to take daily.  Follow-up closely with the eye doctor if your symptoms are not resolving.  Avoid rubbing your eyes.  Come back to the ER if you have new or worsening symptoms.

## 2023-11-21 NOTE — ED Provider Notes (Signed)
 [place] EM[facility]ENC[facility] DEPARTMENT AT [facility] Provider Note   CSN: [id number] Arrival date & time: 11/21/23  2020     History  Chief Complaint  Patient presents with   Eye Problem    Johnathan Richmond is a 63 y.o. male.  He has past medical history of diabetes, hypertension, atrial flutter on Eliquis, presents the ER today for evaluation of eye redness and irritation.  He states this started on Saturday, he has noticed that looking at the computer at night when he is at work and seems to make it worse.  He noticed that his eyes were irritated and has been rubbing them, he states the left eye seems to be somewhat worse.  He states when he stares at computer for a long period of time he gets mild blurry vision but overall denies blurry vision, he does not wear contacts or glasses. He denies fever or chills, he has had a runny nose, but denies a cough.  Denies any foreign body sensation, denies photophobia, denies purulent drainage or crusting but states his eyes have been watering more than usual   Eye Problem      Home Medications Prior to Admission medications   Medication Sig Start Date End Date Taking? Authorizing Provider  cetirizine (ZYRTEC ALLERGY) 10 MG tablet Take 1 tablet (10 mg total) by mouth daily. 11/21/23  Yes Meleana Commerford A, PA-C  erythromycin ophthalmic ointment Place 1 Application into the right eye 4 (four) times daily for 5 days. 11/21/23 11/26/23 Yes Cydney Alvarenga A, PA-C  apixaban (ELIQUIS) 5 MG TABS tablet Take 1 tablet (5 mg total) by mouth 2 (two) times daily. 11/17/21   Gerhard Munch, MD  benzonatate (TESSALON) 100 MG capsule Take 1 capsule (100 mg total) by mouth 3 (three) times daily as needed for cough. Do not take with alcohol or while driving or operating heavy machinery.  May cause drowsiness. 07/06/23   Valentino Nose, NP  cyclobenzaprine (FLEXERIL) 10 MG tablet Take 1 tablet (10 mg total) by mouth 3 (three) times daily as needed for  muscle spasms. Do not drink alcohol or drive while taking this medication.  May cause drowsiness. 08/14/23   Particia Nearing, PA-C  diltiazem (CARDIZEM CD) 120 MG 24 hr capsule Take 1 capsule (120 mg total) by mouth daily. 11/17/21   Gerhard Munch, MD  meclizine (ANTIVERT) 25 MG tablet Take 1 tablet (25 mg total) by mouth 3 (three) times daily as needed for dizziness. Patient not taking: Reported on 11/17/2021 11/21/20   Glynn Octave, MD  metFORMIN (GLUCOPHAGE) 500 MG tablet Take 1 tablet (500 mg total) by mouth 2 (two) times daily with a meal. 10/31/23 11/30/23  Leath-Warren, Sadie Haber, NP  pantoprazole (PROTONIX) 40 MG tablet Take 1 tablet (40 mg total) by mouth daily. 11/17/21   Gerhard Munch, MD      Allergies    Patient has no known allergies.    Review of Systems   Review of Systems  Physical Exam Updated Vital Signs BP 135/80   Pulse 81   Temp 98 F (36.7 C) (Oral)   Resp 18   Ht 6\' 2"  (1.88 m)   Wt 111.1 kg   SpO2 97%   BMI 31.45 kg/m  Physical Exam Vitals and nursing note reviewed.  Constitutional:      General: He is not in acute distress.    Appearance: He is well-developed.  HENT:     Head: Normocephalic and atraumatic.  Eyes:  General: Lids are normal.        Right eye: No foreign body.        Left eye: No foreign body.     Extraocular Movements:     Right eye: Normal extraocular motion and no nystagmus.     Left eye: Normal extraocular motion and no nystagmus.     Conjunctiva/sclera:     Right eye: Right conjunctiva is injected. No chemosis or exudate.    Left eye: Left conjunctiva is injected. No chemosis or exudate.    Pupils: Pupils are equal, round, and reactive to light.     Right eye: No corneal abrasion or fluorescein uptake. Seidel exam negative.     Left eye: No corneal abrasion or fluorescein uptake. Seidel exam negative. Cardiovascular:     Rate and Rhythm: Normal rate and regular rhythm.     Heart sounds: No murmur  heard. Pulmonary:     Effort: Pulmonary effort is normal. No respiratory distress.     Breath sounds: Normal breath sounds.  Abdominal:     Palpations: Abdomen is soft.     Tenderness: There is no abdominal tenderness.  Musculoskeletal:        General: No swelling.     Cervical back: Neck supple.  Skin:    General: Skin is warm and dry.     Capillary Refill: Capillary refill takes less than 2 seconds.  Neurological:     General: No focal deficit present.     Mental Status: He is alert and oriented to person, place, and time.  Psychiatric:        Mood and Affect: Mood normal.     ED Results / Procedures / Treatments   Labs (all labs ordered are listed, but only abnormal results are displayed) Labs Reviewed - No data to display  EKG None  Radiology No results found.  Procedures Procedures    Medications Ordered in ED Medications  tetracaine (PONTOCAINE) 0.5 % ophthalmic solution 1 drop (1 drop Both Eyes Given by Other 11/21/23 2249)  fluorescein ophthalmic strip 1 strip (1 strip Both Eyes Given 11/21/23 2250)  erythromycin ophthalmic ointment ( Both Eyes Given 11/21/23 2250)    ED Course/ Medical Decision Making/ A&P                                 Medical Decision Making Differential diagnose includes but not limited to conjunctivitis, corneal abrasion, corneal foreign body, iritis, glaucoma, other  ED course: Patient presents the ER for evaluation of redness bilateral eyes, left greater than right ongoing for the past couple of days, he does admit to rubbing his eyes, has had some watery drainage bilaterally, also having some nasal congestion.  No fevers or chills, no purulent drainage, does not wear contacts or or glasses, vision is intact.  Fluorescein exam is normal.  Irritation resolved with topical tetracaine drops.  Discussed with patient likely viral versus allergic conjunctivitis, will prescribe erythromycin in case of possible tiny missed corneal abrasion or  bacterial conjunctivitis though feel this is less likely encouraged him to follow-up with ophthalmology and start taking Zyrtec daily to see if this improves symptoms as well.  Advised on strict return precautions.  He is agree with plan of care and discharge.  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    Risk OTC drugs. Prescription drug management.  Final Clinical Impression(s) / ED Diagnoses Final diagnoses:  Conjunctivitis of both eyes, unspecified conjunctivitis type    Rx / DC Orders ED Discharge Orders          Ordered    erythromycin ophthalmic ointment  4 times daily        11/21/23 2233    cetirizine (ZYRTEC ALLERGY) 10 MG tablet  Daily        11/21/23 2235              Ma Rings, PA-C 11/21/23 2338    Anders Simmonds T, DO 11/23/23 2319

## 2024-01-02 ENCOUNTER — Ambulatory Visit (INDEPENDENT_AMBULATORY_CARE_PROVIDER_SITE_OTHER): Payer: PRIVATE HEALTH INSURANCE | Admitting: Physician Assistant

## 2024-01-02 ENCOUNTER — Encounter: Payer: Self-pay | Admitting: Physician Assistant

## 2024-01-02 VITALS — BP 135/88 | HR 73 | Temp 98.1°F | Ht 74.0 in | Wt 256.0 lb

## 2024-01-02 DIAGNOSIS — Z794 Long term (current) use of insulin: Secondary | ICD-10-CM

## 2024-01-02 DIAGNOSIS — E1165 Type 2 diabetes mellitus with hyperglycemia: Secondary | ICD-10-CM

## 2024-01-02 DIAGNOSIS — Z8679 Personal history of other diseases of the circulatory system: Secondary | ICD-10-CM

## 2024-01-02 MED ORDER — APIXABAN 5 MG PO TABS: 5.0000 mg | ORAL_TABLET | Freq: Two times a day (BID) | ORAL | 0 refills | Status: DC

## 2024-01-02 MED ORDER — LANTUS SOLOSTAR 100 UNIT/ML ~~LOC~~ SOPN: 20.0000 [IU] | PEN_INJECTOR | Freq: Every day | SUBCUTANEOUS | 99 refills | Status: DC

## 2024-01-02 MED ORDER — METFORMIN HCL ER 750 MG PO TB24: 750.0000 mg | ORAL_TABLET | Freq: Every day | ORAL | 0 refills | Status: DC

## 2024-01-02 MED ORDER — PEN NEEDLES 31G X 5 MM MISC: 1.0000 | Freq: Every day | 0 refills | Status: DC

## 2024-01-02 MED ORDER — DILTIAZEM HCL ER COATED BEADS 120 MG PO CP24: 120.0000 mg | ORAL_CAPSULE | Freq: Every day | ORAL | 0 refills | Status: DC

## 2024-01-02 MED ORDER — DILTIAZEM HCL ER COATED BEADS 120 MG PO CP24
120.0000 mg | ORAL_CAPSULE | Freq: Every day | ORAL | 0 refills | Status: DC
Start: 2024-01-02 — End: 2024-02-08

## 2024-01-02 NOTE — Assessment & Plan Note (Signed)
 Physical exam without abnormal findings today. Regular rate and rhythm. Patient with reports of palpitations. Elquis and Cardizem  refilled today. Referral to cardiology for further management. Return precautions given.

## 2024-01-02 NOTE — Progress Notes (Signed)
 New Patient Office Visit  Subjective    Patient ID: Johnathan Richmond, male    DOB: 07-26-1961  Age: 63 y.o. MRN: [id number]  CC:  Chief Complaint  Patient presents with   Establish Care    HPI Johnathan Richmond presents to establish care  Patient presents today with past medical history significant for uncontrolled type 2 diabetes, atrial flutter, aortic stenosis, hypertension, and subclinical hypothyroidism. He presents today with concerns for fatigue, weakness, weight loss, excessive hunger and thirst, and increased urination. He relates numbness in his feet. He has been seen in the ER multiple times for hyperglycemia with no diabetic follow up. He reports he has not seen cariology or been on his cardiac medication in over 1 year. He reports palpations but denies chest pain or shortness of breath.   Outpatient Encounter Medications as of 01/02/2024  Medication Sig   insulin  glargine (LANTUS SOLOSTAR) 100 UNIT/ML Solostar Pen Inject 20 Units into the skin daily.   Insulin  Pen Needle (PEN NEEDLES) 31G X 5 MM MISC 1 each by Does not apply route daily. May substitute to any manufacturer covered by patient's insurance.   metFORMIN  (GLUCOPHAGE -XR) 750 MG 24 hr tablet Take 1 tablet (750 mg total) by mouth daily with breakfast.   apixaban  (ELIQUIS ) 5 MG TABS tablet Take 1 tablet (5 mg total) by mouth 2 (two) times daily.   cetirizine  (ZYRTEC  ALLERGY) 10 MG tablet Take 1 tablet (10 mg total) by mouth daily.   diltiazem  (CARDIZEM  CD) 120 MG 24 hr capsule Take 1 capsule (120 mg total) by mouth daily.   [DISCONTINUED] apixaban  (ELIQUIS ) 5 MG TABS tablet Take 1 tablet (5 mg total) by mouth 2 (two) times daily.   [DISCONTINUED] benzonatate  (TESSALON ) 100 MG capsule Take 1 capsule (100 mg total) by mouth 3 (three) times daily as needed for cough. Do not take with alcohol or while driving or operating heavy machinery.  May cause drowsiness.   [DISCONTINUED] cyclobenzaprine  (FLEXERIL ) 10 MG tablet Take  1 tablet (10 mg total) by mouth 3 (three) times daily as needed for muscle spasms. Do not drink alcohol or drive while taking this medication.  May cause drowsiness.   [DISCONTINUED] diltiazem  (CARDIZEM  CD) 120 MG 24 hr capsule Take 1 capsule (120 mg total) by mouth daily.   [DISCONTINUED] meclizine  (ANTIVERT ) 25 MG tablet Take 1 tablet (25 mg total) by mouth 3 (three) times daily as needed for dizziness. (Patient not taking: Reported on 11/17/2021)   [DISCONTINUED] metFORMIN  (GLUCOPHAGE ) 500 MG tablet Take 1 tablet (500 mg total) by mouth 2 (two) times daily with a meal.   [DISCONTINUED] pantoprazole  (PROTONIX ) 40 MG tablet Take 1 tablet (40 mg total) by mouth daily.   No facility-administered encounter medications on file as of 01/02/2024.    Past Medical History:  Diagnosis Date   Aortic stenosis    Diabetes mellitus (HCC)    Essential hypertension    Hypothyroidism    Morbid obesity (HCC)    Paroxysmal atrial flutter (HCC)     Past Surgical History:  Procedure Laterality Date   CHOLECYSTECTOMY      Family History  Problem Relation Age of Onset   Schizophrenia Mother    COPD Mother    Epilepsy Father    Schizophrenia Brother     Social History   Socioeconomic History   Marital status: Divorced    Spouse name: Not on file   Number of children: 0   Years of education: Not on file  Highest education level: Not on file  Occupational History   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  Tobacco Use   Smoking status: Never   Smokeless tobacco: Never  Vaping Use   Vaping status: Never Used  Substance and Sexual Activity   Alcohol use: No   Drug use: No   Sexual activity: Not on file  Other Topics Concern   Not on file  Social History Narrative   Not on file   Social Drivers of Health   Financial Resource Strain: Not on file  Food Insecurity: Not on file  Transportation Needs: Not on file  Physical Activity: Not on file  Stress: Not on file  Social  Connections: Not on file  Intimate Partner Violence: Not on file    Review of Systems  Constitutional:  Positive for malaise/fatigue. Negative for chills and fever.  Eyes:  Negative for blurred vision and double vision.  Respiratory:  Negative for cough and shortness of breath.   Cardiovascular:  Positive for palpitations. Negative for chest pain.  Gastrointestinal:  Positive for diarrhea. Negative for nausea and vomiting.  Musculoskeletal:  Negative for joint pain and myalgias.  Neurological:  Positive for sensory change and weakness. Negative for dizziness, tingling and headaches.  Endo/Heme/Allergies:  Positive for polydipsia.  Psychiatric/Behavioral:  Negative for depression. The patient is not nervous/anxious.      Objective    BP 135/88   Pulse 73   Temp 98.1 F (36.7 C)   Ht 6\' 2"  (1.88 m)   Wt 256 lb (116.1 kg)   SpO2 97%   BMI 32.87 kg/m   Physical Exam Constitutional:      Appearance: Normal appearance. He is obese.  HENT:     Head: Normocephalic.     Mouth/Throat:     Mouth: Mucous membranes are moist.     Pharynx: Oropharynx is clear.  Eyes:     Extraocular Movements: Extraocular movements intact.     Conjunctiva/sclera: Conjunctivae normal.  Cardiovascular:     Rate and Rhythm: Normal rate and regular rhythm.     Heart sounds: Murmur heard.     No gallop.  Pulmonary:     Effort: Pulmonary effort is normal.     Breath sounds: No wheezing.  Musculoskeletal:     Right lower leg: No edema.     Left lower leg: No edema.  Skin:    General: Skin is warm and dry.  Neurological:     General: No focal deficit present.     Mental Status: He is alert and oriented to person, place, and time.  Psychiatric:        Mood and Affect: Mood normal.        Behavior: Behavior normal.       Assessment & Plan:  Type 2 diabetes mellitus with hyperglycemia, with long-term current use of insulin  (HCC) Assessment & Plan: Not Controlled, newly diagnosed.  Hyperglycemia. Increasing metformin  to 750mg  XR.  Basal insulin  20 units nightly. We discussed how to use insulin  pen and proper insulin  injection areas.  Referral to diabetic education.  POC glucose 259 today. A1c today.  Lipid panel checked today. Microalbumin checked today.  Discussed dietary efforts to include lean meats, increased vegetables, increased fruits, decreased fast carbs, decreased sweets, sodas, and candies.  Discussed exercise efforts to include approximately 150 minutes of moderate exercise weekly.  Patient to follow up in 3 months for repeat A1c.   Orders: -     Amb Referral to Nutrition  and Diabetic Education -     metFORMIN  HCl ER; Take 1 tablet (750 mg total) by mouth daily with breakfast.  Dispense: 90 tablet; Refill: 0 -     Lantus SoloStar; Inject 20 Units into the skin daily.  Dispense: 15 mL; Refill: PRN -     Pen Needles; 1 each by Does not apply route daily. May substitute to any manufacturer covered by patient's insurance.  Dispense: 100 each; Refill: 0 -     Hemoglobin A1c -     CMP14+EGFR -     Lipid panel -     Microalbumin / creatinine urine ratio  History of atrial flutter Assessment & Plan: Physical exam without abnormal findings today. Regular rate and rhythm. Patient with reports of palpitations. Elquis and Cardizem  refilled today. Referral to cardiology for further management. Return precautions given.   Orders: -     Ambulatory referral to Cardiology -     Apixaban ; Take 1 tablet (5 mg total) by mouth 2 (two) times daily.  Dispense: 60 tablet; Refill: 0 -     dilTIAZem  HCl ER Coated Beads; Take 1 capsule (120 mg total) by mouth daily.  Dispense: 30 capsule; Refill: 0 -     CBC with Differential/Platelet    Return in about 3 months (around 04/02/2024) for diabetes.   [name] Zakiyah Diop, PA-C

## 2024-01-02 NOTE — Addendum Note (Signed)
 Addended by: Marco Severs on: 01/02/2024 02:38 PM   Modules accepted: Orders

## 2024-01-02 NOTE — Assessment & Plan Note (Signed)
 Not Controlled, newly diagnosed. Hyperglycemia. Increasing metformin  to 750mg  XR.  Basal insulin  20 units nightly. We discussed how to use insulin  pen and proper insulin  injection areas.  Referral to diabetic education.  POC glucose 259 today. A1c today.  Lipid panel checked today. Microalbumin checked today.  Discussed dietary efforts to include lean meats, increased vegetables, increased fruits, decreased fast carbs, decreased sweets, sodas, and candies.  Discussed exercise efforts to include approximately 150 minutes of moderate exercise weekly.  Patient to follow up in 3 months for repeat A1c.

## 2024-01-03 ENCOUNTER — Encounter: Payer: Self-pay | Admitting: Physician Assistant

## 2024-01-03 LAB — CBC WITH DIFFERENTIAL/PLATELET
Basophils Absolute: 0 10*3/uL (ref 0.0–0.2)
Basos: 1 %
EOS (ABSOLUTE): 0.1 10*3/uL (ref 0.0–0.4)
Eos: 1 %
Hematocrit: 46.3 % (ref 37.5–51.0)
Hemoglobin: 15.1 g/dL (ref 13.0–17.7)
Immature Grans (Abs): 0 10*3/uL (ref 0.0–0.1)
Immature Granulocytes: 0 %
Lymphocytes Absolute: 1.4 10*3/uL (ref 0.7–3.1)
Lymphs: 35 %
MCH: 27.5 pg (ref 26.6–33.0)
MCHC: 32.6 g/dL (ref 31.5–35.7)
MCV: 84 fL (ref 79–97)
Monocytes Absolute: 0.3 10*3/uL (ref 0.1–0.9)
Monocytes: 7 %
Neutrophils Absolute: 2.3 10*3/uL (ref 1.4–7.0)
Neutrophils: 56 %
Platelets: 243 10*3/uL (ref 150–450)
RBC: 5.5 x10E6/uL (ref 4.14–5.80)
RDW: 13.1 % (ref 11.6–15.4)
WBC: 4.1 10*3/uL (ref 3.4–10.8)

## 2024-01-03 LAB — CMP14+EGFR
ALT: 19 IU/L (ref 0–44)
AST: 16 IU/L (ref 0–40)
Albumin: 4.2 g/dL (ref 3.9–4.9)
Alkaline Phosphatase: 109 IU/L (ref 44–121)
BUN/Creatinine Ratio: 11 (ref 10–24)
BUN: 11 mg/dL (ref 8–27)
Bilirubin Total: 0.3 mg/dL (ref 0.0–1.2)
CO2: 23 mmol/L (ref 20–29)
Calcium: 8.9 mg/dL (ref 8.6–10.2)
Chloride: 103 mmol/L (ref 96–106)
Creatinine, Ser: 0.97 mg/dL (ref 0.76–1.27)
Globulin, Total: 2.4 g/dL (ref 1.5–4.5)
Glucose: 279 mg/dL — ABNORMAL HIGH (ref 70–99)
Potassium: 4.4 mmol/L (ref 3.5–5.2)
Sodium: 138 mmol/L (ref 134–144)
Total Protein: 6.6 g/dL (ref 6.0–8.5)
eGFR: 87 mL/min/{1.73_m2} (ref >59)

## 2024-01-03 LAB — LIPID PANEL
Chol/HDL Ratio: 4.1 ratio (ref 0.0–5.0)
Cholesterol, Total: 190 mg/dL (ref 100–199)
HDL: 46 mg/dL (ref >39)
LDL Chol Calc (NIH): 126 mg/dL — ABNORMAL HIGH (ref 0–99)
Triglycerides: 99 mg/dL (ref 0–149)
VLDL Cholesterol Cal: 18 mg/dL (ref 5–40)

## 2024-01-03 LAB — HEMOGLOBIN A1C
Est. average glucose Bld gHb Est-mCnc: 292 mg/dL
Hgb A1c MFr Bld: 11.8 % — ABNORMAL HIGH (ref 4.8–5.6)

## 2024-01-11 ENCOUNTER — Other Ambulatory Visit: Payer: Self-pay | Admitting: Physician Assistant

## 2024-01-11 DIAGNOSIS — Z8679 Personal history of other diseases of the circulatory system: Secondary | ICD-10-CM

## 2024-01-12 ENCOUNTER — Telehealth: Payer: Self-pay

## 2024-01-12 NOTE — Progress Notes (Signed)
 Care Guide Pharmacy Note  01/12/2024 Name: Johnathan Richmond MRN: [id number] DOB: 1960/12/03  Referred By: Marco Severs, PA-C Reason for referral: Complex Care Management (Outreach to schedule with Pharm d )   Johnathan Richmond is a 63 y.o. year old male who is a primary care patient of Grooms, [name], [place].  Jerie Montana was referred to the pharmacist for assistance related to: HTN  Successful contact was made with the patient to discuss pharmacy services including being ready for the pharmacist to call at least 5 minutes before the scheduled appointment time and to have medication bottles and any blood pressure readings ready for review. The patient agreed to meet with the pharmacist via telephone visit on (date/time).01/15/2024  [name] , RMA     [place]  [facility] Guide  Direct Dial: [phone]  Website: [email or web address]

## 2024-01-15 ENCOUNTER — Ambulatory Visit: Payer: PRIVATE HEALTH INSURANCE | Admitting: Physician Assistant

## 2024-01-15 ENCOUNTER — Other Ambulatory Visit: Payer: Self-pay

## 2024-01-15 NOTE — Progress Notes (Unsigned)
 01/17/2024  Patient ID: Johnathan Richmond, male   DOB: August 04, 1961, 63 y.o.   MRN: [id number]    01/17/2024 Name: Johnathan Richmond MRN: [id number] DOB: [birth year]  Chief Complaint  Patient presents with   Diabetes   Medication Management    DUNCAN BLOYER is a 63 y.o. year old male who presented for a telephone visit.   They were referred to the pharmacist by their PCP for assistance in managing medication access.    Subjective:  Care Team: Primary Care Provider: Grooms, Alayne Hubert ; Next Scheduled Visit:  Future Appointments  Date Time Provider Department Center  02/05/2024  9:00 AM Rolando Cliche, [facility] CHL-POP[facility] None  04/02/2024  8:40 AM Grooms, Alayne Hubert RFM-RFM RFML      Medication Access/Adherence  Current Pharmacy:  [facility] [phone] - [place], [place] - [address] AT [facility] OF [address] [place] [address] Phone: [phone] Fax: [phone]  [id number] - [place],  - [address] AT [facility] OF [address] [place] [address] Phone: [phone] Fax: [phone]   Patient reports affordability concerns with their medications: Yes  Patient reports access/transportation concerns to their pharmacy: Yes  Patient reports adherence concerns with their medications:  Yes  - cost issues with insulin  and and eliquis . States he does not have a current Financial controller. Previously had one through [employer] but not this year.    Diabetes:  Current medications:  Medications tried in the past:   Current glucose readings: not able to test.    Observed patterns:  Patient denies hypoglycemic s/sx including dizziness, shakiness, sweating. Patient denies hyperglycemic symptoms including polyuria, polydipsia, polyphagia, nocturia, neuropathy, blurred vision.  Current meal patterns:  - focusing on increasing fruit intake, lowering carbohydrates.  Current  physical activity: walking/golf  Current medication access support: none currently    Objective:  Lab Results  Component Value Date   HGBA1C 11.8 (H) 01/02/2024    Lab Results  Component Value Date   CREATININE 0.97 01/02/2024   BUN 11 01/02/2024   NA 138 01/02/2024   K 4.4 01/02/2024   CL 103 01/02/2024   CO2 23 01/02/2024    Lab Results  Component Value Date   CHOL 190 01/02/2024   HDL 46 01/02/2024   LDLCALC 126 (H) 01/02/2024   TRIG 99 01/02/2024   CHOLHDL 4.1 01/02/2024    Medications Reviewed Today     Reviewed by Rolando Cliche, [facility] (Pharmacist) on 01/15/24 at [phone]  Med List Status: <None>   Medication Order Taking? Sig Documenting Provider Last Dose Status Informant  apixaban  (ELIQUIS ) 5 MG TABS tablet [id number]  Take 1 tablet (5 mg total) by mouth 2 (two) times daily. Grooms, [place], [place]  Active   cetirizine  (ZYRTEC  ALLERGY) 10 MG tablet [id number]  Take 1 tablet (10 mg total) by mouth daily. Latricia Poles, Celeste A, PA-C  Active   diltiazem  (CARDIZEM  CD) 120 MG 24 hr capsule [id number]  Take 1 capsule (120 mg total) by mouth daily. Grooms, [place], [place]  Active   insulin  glargine (LANTUS  SOLOSTAR) 100 UNIT/ML Solostar Pen [id number]  Inject 20 Units into the skin daily. Grooms, [place], [place]  Active   Insulin  Pen Needle (PEN NEEDLES) 31G X 5 MM MISC [id number]  1 each by Does not apply route daily. May substitute to any manufacturer covered by patient's insurance. Grooms, [place], PA-C  Active   metFORMIN  (GLUCOPHAGE -XR)  750 MG 24 hr tablet [id number]  Take 1 tablet (750 mg total) by mouth daily with breakfast. Grooms, [place], PA-C  Active   Med List Note Barbee Bookman 09/25/14 1552): No Pharmacy preference              Assessment/Plan:   Diabetes: - Currently uncontrolled - Reviewed long term cardiovascular and renal outcomes of uncontrolled blood sugar - Reviewed goal A1c, goal fasting, and goal 2 hour post prandial glucose - Reviewed  dietary modifications including low carb diet - Reviewed lifestyle modifications including:  - Recommend to exercise 30 minutes per day   - Patient denies personal or family history of multiple endocrine neoplasia type 2, medullary thyroid  cancer; personal history of pancreatitis or gallbladder disease. - Recommend to check glucose daily   Financial Assistance: - Meets financial criteria for Eliquis  patient patient assistance program through [employer]. Will collaborate with provider, CPhT, and patient to pursue assistance.  -- will also send out san[employer]i patient connection for lantus    Follow Up Plan: 2wk telephone call  Rolando Cliche, PharmD, BCGP Clinical Pharmacist  [phone]

## 2024-01-16 ENCOUNTER — Ambulatory Visit: Payer: PRIVATE HEALTH INSURANCE | Admitting: Physician Assistant

## 2024-01-18 ENCOUNTER — Other Ambulatory Visit (HOSPITAL_COMMUNITY): Payer: Self-pay

## 2024-01-26 ENCOUNTER — Other Ambulatory Visit (HOSPITAL_COMMUNITY): Payer: Self-pay

## 2024-02-05 ENCOUNTER — Other Ambulatory Visit: Payer: Self-pay

## 2024-02-05 NOTE — Progress Notes (Signed)
   02/05/2024  Patient ID: Johnathan Richmond, male   DOB: [birth year], 63 y.o.   MRN: [id number]  Spoke with patient regarding DM management and financial assistance related to eliquis  and lantus . Since last talking, it was noted that patient had commercial insurance coverage, although high copay. He would not be eligible for [employer] patient assistance however could have copay card applied w/ [facility].   Has not picked up BG meter d/t feeling sick with the flu past few days however is starting to feel better and plans to get out soon.   Copay cards for lantus  and eliquis  uploaded to media tab and faxed to [facility] pharmacy.   Future Appointments  Date Time Provider Department Center  02/19/2024  9:00 AM Rolando Cliche, [facility] CHL-POPH None  04/02/2024  8:40 AM Grooms, Alayne Hubert RFM-RFM [facility]    [facility] [phone] - [place], [place] - [address] AT [facility] [address] [place] [address] Phone: [phone] Fax: [phone]  [facility] #[id number] - [place], [place] - [address] AT [facility] OF [address] [place] [address] Phone: [phone] Fax: [phone]    Rolando Cliche, PharmD, BCGP Clinical Pharmacist  [phone]

## 2024-02-08 ENCOUNTER — Encounter: Payer: Self-pay | Admitting: Emergency Medicine

## 2024-02-08 ENCOUNTER — Ambulatory Visit
Admission: EM | Admit: 2024-02-08 | Discharge: 2024-02-08 | Disposition: A | Discharge status: Home / Self Care | Payer: Self-pay | Attending: Nurse Practitioner | Admitting: Nurse Practitioner

## 2024-02-08 ENCOUNTER — Other Ambulatory Visit: Payer: Self-pay | Admitting: Physician Assistant

## 2024-02-08 DIAGNOSIS — Z8679 Personal history of other diseases of the circulatory system: Secondary | ICD-10-CM

## 2024-02-08 DIAGNOSIS — J069 Acute upper respiratory infection, unspecified: Secondary | ICD-10-CM

## 2024-02-08 LAB — POCT FASTING CBG KUC MANUAL ENTRY: POCT Glucose (KUC): 140 mg/dL — AB (ref 70–99)

## 2024-02-08 MED ORDER — BENZONATATE 100 MG PO CAPS: 100.0000 mg | ORAL_CAPSULE | Freq: Three times a day (TID) | ORAL | 0 refills | Status: DC | PRN

## 2024-02-08 NOTE — ED Triage Notes (Addendum)
 Pt reports cough, runny nose for last several days. Reports coworker had something similar.   Pt reports elevated CBG prior to symptoms starting.

## 2024-02-08 NOTE — Discharge Instructions (Addendum)
 You have a viral upper respiratory infection.  Symptoms should improve over the next week to 10 days.  If you develop chest pain or shortness of breath, go to the emergency room.  Some things that can make you feel better are: - Increased rest - Increasing fluid with water/sugar free electrolytes - Acetaminophen and ibuprofen as needed for fever/pain - Salt water gargling, chloraseptic spray and throat lozenges - OTC guaifenesin (Mucinex) 600 mg twice daily for congestion - Saline sinus flushes or a neti pot - Humidifying the air -Tessalon Perles every 8 hours as needed for dry cough

## 2024-02-08 NOTE — ED Provider Notes (Signed)
 [facility]    CSN: [id number] Arrival date & time: 02/08/24  1349      History   Chief Complaint Chief Complaint  Patient presents with   Cough    HPI Johnathan CHAVARRIA is a 63 y.o. male.   Patient presents today with 1 week history of congested cough, chest congestion, stuffy and runny nose, decreased appetite, and fatigue.  He denies fever, body aches or chills, shortness of breath or chest pain, sore throat, headache, ear pain, abdominal pain, nausea/vomiting, and diarrhea.  Reports a coworker was sick recently with similar symptoms and he is a Museum/gallery exhibitions officer.  Has taken Robitussin for symptoms with minimal temporary improvement.  Patient reports history of type 2 diabetes.  Was recently started on insulin .  He is requesting we check his blood sugar today.  Reports he is buying a glucometer today at the pharmacy.    Past Medical History:  Diagnosis Date   Aortic stenosis    Diabetes mellitus (HCC)    Essential hypertension    Hypothyroidism    Morbid obesity (HCC)    Paroxysmal atrial flutter (HCC)     Patient Active Problem List   Diagnosis Date Noted   Aortic stenosis 08/29/2019   History of atrial flutter 08/29/2019   Subclinical hypothyroidism 08/29/2019   Essential hypertension 08/29/2019   Type 2 diabetes mellitus (HCC) 08/29/2019    Past Surgical History:  Procedure Laterality Date   CHOLECYSTECTOMY         Home Medications    Prior to Admission medications   Medication Sig Start Date End Date Taking? Authorizing Provider  benzonatate  (TESSALON ) 100 MG capsule Take 1 capsule (100 mg total) by mouth 3 (three) times daily as needed for cough. Do not take with alcohol or while operating or driving heavy machinery 03/12/28  Yes Thena Fireman A, NP  apixaban  (ELIQUIS ) 5 MG TABS tablet Take 1 tablet (5 mg total) by mouth 2 (two) times daily. 01/02/24   Grooms, [place], PA-C  cetirizine  (ZYRTEC  ALLERGY) 10 MG tablet Take 1 tablet (10 mg  total) by mouth daily. 11/21/23   Baxter Limber A, PA-C  diltiazem  (CARDIZEM  CD) 120 MG 24 hr capsule TAKE 1 CAPSULE(120 MG) BY MOUTH DAILY 02/08/24   Grooms, [place], PA-C  insulin  glargine (LANTUS  SOLOSTAR) 100 UNIT/ML Solostar Pen Inject 20 Units into the skin daily. 01/02/24   Grooms, [place], PA-C  Insulin  Pen Needle (PEN NEEDLES) 31G X 5 MM MISC 1 each by Does not apply route daily. May substitute to any manufacturer covered by patient's insurance. 01/02/24   Grooms, [place], PA-C  metFORMIN  (GLUCOPHAGE -XR) 750 MG 24 hr tablet Take 1 tablet (750 mg total) by mouth daily with breakfast. 01/02/24   Grooms, [place], PA-C    Family History Family History  Problem Relation Age of Onset   Schizophrenia Mother    COPD Mother    Epilepsy Father    Schizophrenia Brother     Social History Social History   Tobacco Use   Smoking status: Never   Smokeless tobacco: Never  Vaping Use   Vaping status: Never Used  Substance Use Topics   Alcohol use: No   Drug use: No     Allergies   Patient has no known allergies.   Review of Systems Review of Systems Per HPI  Physical Exam Triage Vital Signs ED Triage Vitals [02/08/24 1409]  Encounter Vitals Group     BP 138/69     Systolic BP Percentile  Diastolic BP Percentile      Pulse Rate 79     Resp 20     Temp 98 F (36.7 C)     Temp Source Oral     SpO2 95 %     Weight      Height      Head Circumference      Peak Flow      Pain Score      Pain Loc      Pain Education      Exclude from Growth Chart    No data found.  Updated Vital Signs BP 138/69 (BP Location: Right Arm)   Pulse 79   Temp 98 F (36.7 C) (Oral)   Resp 20   SpO2 95%   Visual Acuity Right Eye Distance:   Left Eye Distance:   Bilateral Distance:    Right Eye Near:   Left Eye Near:    Bilateral Near:     Physical Exam Vitals and nursing note reviewed.  Constitutional:      General: He is not in acute distress.    Appearance: Normal  appearance. He is not ill-appearing or toxic-appearing.  HENT:     Head: Normocephalic and atraumatic.     Right Ear: Tympanic membrane, ear canal and external ear normal.     Left Ear: Tympanic membrane, ear canal and external ear normal.     Nose: Congestion present. No rhinorrhea.     Mouth/Throat:     Mouth: Mucous membranes are moist.     Pharynx: Oropharynx is clear. Posterior oropharyngeal erythema present. No oropharyngeal exudate.  Eyes:     General: No scleral icterus.    Extraocular Movements: Extraocular movements intact.  Cardiovascular:     Rate and Rhythm: Normal rate and regular rhythm.  Pulmonary:     Effort: Pulmonary effort is normal. No respiratory distress.     Breath sounds: Normal breath sounds. No wheezing, rhonchi or rales.  Musculoskeletal:     Cervical back: Normal range of motion and neck supple.  Lymphadenopathy:     Cervical: No cervical adenopathy.  Skin:    General: Skin is warm and dry.     Coloration: Skin is not jaundiced or pale.     Findings: No erythema or rash.  Neurological:     Mental Status: He is alert and oriented to person, place, and time.  Psychiatric:        Behavior: Behavior is cooperative.     UC Treatments / Results  Labs (all labs ordered are listed, but only abnormal results are displayed) Labs Reviewed  POCT FASTING CBG [facility] MANUAL ENTRY - Abnormal; Notable for the following components:      Result Value   POCT Glucose ([facility]) 140 (*)    All other components within normal limits    EKG   Radiology No results found.  Procedures Procedures (including critical care time)  Medications Ordered in UC Medications - No data to display  Initial Impression / Assessment and Plan / UC Course  I have reviewed the triage vital signs and the nursing notes.  Pertinent labs & imaging results that were available during my care of the patient were reviewed by me and considered in my medical decision making (see chart for  details).   Patient is well-appearing, normotensive, afebrile, not tachycardic, not tachypneic, oxygenating well on room air.   1. Viral URI with cough Suspect viral etiology Vitals and exam are reassuring Given length of symptoms,  viral testing deferred Supportive care discussed with patient, continue guaifenesin, start cough suppressant medication as needed  CBG 140s-much improved from previous; follow-up with PCP as planned   The patient was given the opportunity to ask questions.  All questions answered to their satisfaction.  The patient is in agreement to this plan.   Final Clinical Impressions(s) / UC Diagnoses   Final diagnoses:  Viral URI with cough   Discharge Instructions      You have a viral upper respiratory infection.  Symptoms should improve over the next week to 10 days.  If you develop chest pain or shortness of breath, go to the emergency room.  Some things that can make you feel better are: - Increased rest - Increasing fluid with water/sugar free electrolytes - Acetaminophen  and ibuprofen as needed for fever/pain - Salt water gargling, chloraseptic spray and throat lozenges - OTC guaifenesin (Mucinex) 600 mg twice daily for congestion - Saline sinus flushes or a neti pot - Humidifying the air -Tessalon  Perles every 8 hours as needed for dry cough   ED Prescriptions     Medication Sig Dispense Auth. Provider   benzonatate  (TESSALON ) 100 MG capsule Take 1 capsule (100 mg total) by mouth 3 (three) times daily as needed for cough. Do not take with alcohol or while operating or driving heavy machinery 21 capsule Wilhemena Harbour, NP      PDMP not reviewed this encounter.   Wilhemena Harbour, NP 02/08/24 [phone]

## 2024-02-19 ENCOUNTER — Telehealth: Payer: Self-pay

## 2024-02-19 ENCOUNTER — Other Ambulatory Visit: Payer: Self-pay

## 2024-02-19 NOTE — Progress Notes (Unsigned)
   02/19/2024 Name: Johnathan Richmond MRN: [id number] DOB: 10-22-1960  No chief complaint on file.   {Visit Type:26650}   Subjective:  Care Team: Primary Care Provider: Grooms, Alayne Hubert ; Next Scheduled Visit: *** Future Appointments  Date Time Provider Department Center  04/02/2024  8:40 AM Grooms, Alayne Hubert [facility]  04/30/2024  2:20 PM Mallipeddi, Kennyth Pean, MD [facility]     Medication Access/Adherence  Current Pharmacy:  [facility] [phone] - [place], [place] - [address] AT [facility] [address] [place] [address] Phone: [phone] Fax: [phone]  [id number] - [place], [place] - [address] AT [facility] OF [address] [place] [address] Phone: [phone] Fax: [phone]   Patient reports affordability concerns with their medications: {YES/NO:21197} Patient reports access/transportation concerns to their pharmacy: {YES/NO:21197} Patient reports adherence concerns with their medications:  {YES/NO:21197} ***   {Pharmacy S/O Choices:26420}   Objective:  Lab Results  Component Value Date   HGBA1C 11.8 (H) 01/02/2024    Lab Results  Component Value Date   CREATININE 0.97 01/02/2024   BUN 11 01/02/2024   NA 138 01/02/2024   K 4.4 01/02/2024   CL 103 01/02/2024   CO2 23 01/02/2024    Lab Results  Component Value Date   CHOL 190 01/02/2024   HDL 46 01/02/2024   LDLCALC 126 (H) 01/02/2024   TRIG 99 01/02/2024   CHOLHDL 4.1 01/02/2024    Medications Reviewed Today   Medications were not reviewed in this encounter       Assessment/Plan:   {Pharmacy A/P Choices:26421}  Follow Up Plan: ***  ***

## 2024-02-19 NOTE — Progress Notes (Signed)
   02/19/2024  Patient ID: Johnathan Richmond, male   DOB: 19-Nov-1960, 63 y.o.   MRN: [id number]  Attempted to contact patient for scheduled appointment for medication management. Left HIPAA compliant message for patient to return my call at their convenience.   Hoping to check on BG, see if he got a monitor, and check on copays for eliquis  and lantus  (copay cards faxed earlier this month to his pharmacy).  Rolando Cliche, PharmD, BCGP Clinical Pharmacist  [phone]

## 2024-02-21 ENCOUNTER — Other Ambulatory Visit (HOSPITAL_COMMUNITY): Payer: Self-pay

## 2024-02-21 ENCOUNTER — Telehealth: Payer: Self-pay

## 2024-02-21 NOTE — Progress Notes (Signed)
   02/21/2024  Patient ID: Johnathan Richmond, male   DOB: 1960/11/25, 63 y.o.   MRN: [id number]  Spoke to [facility] at [facility] regarding rx savings card for eliquis . Provided card information - could not be applied d/t insurance being through [employer].

## 2024-04-02 ENCOUNTER — Encounter: Payer: Self-pay | Admitting: Physician Assistant

## 2024-04-02 ENCOUNTER — Ambulatory Visit: Payer: Self-pay | Admitting: Physician Assistant

## 2024-04-02 DIAGNOSIS — Z794 Long term (current) use of insulin: Secondary | ICD-10-CM

## 2024-04-02 DIAGNOSIS — E1165 Type 2 diabetes mellitus with hyperglycemia: Secondary | ICD-10-CM

## 2024-04-02 DIAGNOSIS — Z8679 Personal history of other diseases of the circulatory system: Secondary | ICD-10-CM

## 2024-04-02 MED ORDER — METFORMIN HCL ER 750 MG PO TB24: 750.0000 mg | ORAL_TABLET | Freq: Every day | ORAL | 0 refills | Status: DC

## 2024-04-02 MED ORDER — APIXABAN 5 MG PO TABS: 5.0000 mg | ORAL_TABLET | Freq: Two times a day (BID) | ORAL | 0 refills | Status: DC

## 2024-04-02 MED ORDER — LANTUS SOLOSTAR 100 UNIT/ML ~~LOC~~ SOPN: 20.0000 [IU] | PEN_INJECTOR | Freq: Every day | SUBCUTANEOUS | 1 refills | Status: DC

## 2024-04-02 NOTE — Progress Notes (Signed)
 Established Patient Office Visit  Subjective   Patient ID: Johnathan Richmond, male    DOB: 1961-08-29  Age: 63 y.o. MRN: [id number]  Chief Complaint  Patient presents with   Diabetes    Follow up    Patient presents today follow up follow up regarding diabetes. Currently on metformin  XR and long acting insulin , and states daily compliance with medications. No hypoglycemic events. He is checking blood glucose at home about once weekly, relating last sugar was 168, and on average sugars having been trending down. Patient denies complaints of foot pain, but endorses mild paresthesias. Has not been seen by ophthalmologist over 1 year. He is trying to follow a low carb diet, and is staying active by playing golf.       Review of Systems  Constitutional:  Negative for chills, fever and malaise/fatigue.  Eyes:  Negative for blurred vision and double vision.  Respiratory:  Negative for cough and shortness of breath.   Cardiovascular:  Negative for chest pain and palpitations.  Musculoskeletal:  Negative for joint pain and myalgias.  Neurological:  Negative for dizziness and headaches.  Psychiatric/Behavioral:  Negative for depression. The patient is not nervous/anxious.       Objective:     BP 133/89   Ht 6' 2 (1.88 m)   Wt 264 lb 9.6 oz (120 kg)   BMI 33.97 kg/m    Physical Exam Constitutional:      General: He is not in acute distress.    Appearance: Normal appearance. He is obese.  HENT:     Head: Normocephalic and atraumatic.     Mouth/Throat:     Mouth: Mucous membranes are moist.     Pharynx: Oropharynx is clear.  Eyes:     Extraocular Movements: Extraocular movements intact.     Conjunctiva/sclera: Conjunctivae normal.  Cardiovascular:     Rate and Rhythm: Normal rate and regular rhythm.     Heart sounds: Murmur heard.  Pulmonary:     Effort: Pulmonary effort is normal.     Breath sounds: Normal breath sounds. No wheezing or rales.  Musculoskeletal:      Right lower leg: No edema.     Left lower leg: No edema.  Skin:    General: Skin is warm and dry.  Neurological:     General: No focal deficit present.     Mental Status: He is alert and oriented to person, place, and time.  Psychiatric:        Mood and Affect: Mood normal.        Behavior: Behavior normal.     No results found for any visits on 04/02/24.  The 10-year ASCVD risk score (Arnett DK, et al., 2019) is: 29.5%    Assessment & Plan:   Return in about 6 months (around 10/03/2024) for DM.   Type 2 diabetes mellitus with hyperglycemia, with long-term current use of insulin  [facility]) Assessment & Plan: Improving. Hyperglycemia persists. Continue current treatment plan. Medications refilled today.  A1c today.  Lipid panel checked less than 6 months ago. Microalbumin ordered today.  Discussed dietary efforts to include lean meats, increased vegetables, increased fruits, decreased fast carbs, decreased sweets, sodas, and candies.  Discussed exercise efforts to include approximately 150 minutes of moderate exercise weekly.  Referral placed for diabetic eye exam.  Foot exam done today.  Patient to follow up in 6 months.  Orders: -     Microalbumin / creatinine urine ratio -     Hemoglobin  A1c -     metFORMIN  HCl ER; Take 1 tablet (750 mg total) by mouth daily with breakfast.  Dispense: 90 tablet; Refill: 0 -     Lantus  SoloStar; Inject 20 Units into the skin daily.  Dispense: 15 mL; Refill: 1 -     Ambulatory referral to Ophthalmology  History of atrial flutter -     Apixaban ; Take 1 tablet (5 mg total) by mouth 2 (two) times daily.  Dispense: 60 tablet; Refill: 0    Mallorie Norrod, PA-C

## 2024-04-02 NOTE — Assessment & Plan Note (Signed)
 Improving. Hyperglycemia persists. Continue current treatment plan. Medications refilled today.  A1c today.  Lipid panel checked less than 6 months ago. Microalbumin ordered today.  Discussed dietary efforts to include lean meats, increased vegetables, increased fruits, decreased fast carbs, decreased sweets, sodas, and candies.  Discussed exercise efforts to include approximately 150 minutes of moderate exercise weekly.  Referral placed for diabetic eye exam.  Foot exam done today.  Patient to follow up in 6 months.

## 2024-04-03 ENCOUNTER — Ambulatory Visit: Payer: Self-pay | Admitting: Physician Assistant

## 2024-04-03 LAB — MICROALBUMIN / CREATININE URINE RATIO
Creatinine, Urine: 273.6 mg/dL
Microalb/Creat Ratio: 3 mg/g{creat} (ref 0–29)
Microalbumin, Urine: 9.4 ug/mL

## 2024-04-03 LAB — HEMOGLOBIN A1C
Est. average glucose Bld gHb Est-mCnc: 166 mg/dL
Hgb A1c MFr Bld: 7.4 % — ABNORMAL HIGH (ref 4.8–5.6)

## 2024-04-10 ENCOUNTER — Other Ambulatory Visit: Payer: Self-pay | Admitting: Physician Assistant

## 2024-04-10 DIAGNOSIS — Z794 Long term (current) use of insulin: Secondary | ICD-10-CM

## 2024-04-10 NOTE — Telephone Encounter (Unsigned)
 Copied from CRM #[id number]. Topic: Clinical - Medication Refill >> Apr 10, 2024  3:28 PM Charlet HERO wrote: Medication: insulin  glargine (LANTUS  SOLOSTAR) 100 UNIT/ML Solostar Pen metFORMIN  (GLUCOPHAGE -XR) 750 MG 24 hr tablet  Has the patient contacted their pharmacy? Yes They sent in refill but did not get signed   This is the patient's preferred pharmacy:  [facility] [phone] - [place], [place] - [address] AT [facility] [address] [place] [address] Phone: [phone] Fax: [phone]    Is this the correct pharmacy for this prescription? Yes If no, delete pharmacy and type the correct one.   Has the prescription been filled recently? Yes  Is the patient out of the medication? Yes  Has the patient been seen for an appointment in the last year OR does the patient have an upcoming appointment? Yes  Can we respond through MyChart? No  Agent: Please be advised that Rx refills may take up to 3 business days. We ask that you follow-up with your pharmacy.

## 2024-04-15 ENCOUNTER — Telehealth: Payer: Self-pay

## 2024-04-15 NOTE — Telephone Encounter (Signed)
 Communication  Reason for CRM: Pt is wanting to know why his medications hasn't been sent in, please reach out

## 2024-04-30 ENCOUNTER — Telehealth: Payer: Self-pay | Admitting: Internal Medicine

## 2024-04-30 ENCOUNTER — Ambulatory Visit: Payer: Self-pay | Attending: Internal Medicine | Admitting: Internal Medicine

## 2024-04-30 ENCOUNTER — Encounter: Payer: Self-pay | Admitting: Internal Medicine

## 2024-04-30 VITALS — BP 128/72 | HR 77 | Ht 74.0 in | Wt 267.4 lb

## 2024-04-30 DIAGNOSIS — Z7901 Long term (current) use of anticoagulants: Secondary | ICD-10-CM

## 2024-04-30 DIAGNOSIS — Z8679 Personal history of other diseases of the circulatory system: Secondary | ICD-10-CM

## 2024-04-30 DIAGNOSIS — I350 Nonrheumatic aortic (valve) stenosis: Secondary | ICD-10-CM

## 2024-04-30 MED ORDER — WARFARIN SODIUM 3 MG PO TABS: 3.0000 mg | ORAL_TABLET | Freq: Every day | ORAL | 5 refills | Status: DC

## 2024-04-30 NOTE — Telephone Encounter (Signed)
 Checking percert on the following patient for t   ECHO - STAT   05/02/2024 in the [facility].

## 2024-04-30 NOTE — Patient Instructions (Signed)
 Medication Instructions:  Your physician has recommended you make the following change in your medication:  Stop taking Eliquis  Start taking Coumadin  3 mg once daily Continue taking all other medications as prescribed  Labwork: None  Testing/Procedures: Your physician has requested that you have an echocardiogram. Echocardiography is a painless test that uses sound waves to create images of your heart. It provides your doctor with information about the size and shape of your heart and how well your heart's chambers and valves are working. This procedure takes approximately one hour. There are no restrictions for this procedure. Please do NOT wear cologne, perfume, aftershave, or lotions (deodorant is allowed). Please arrive 15 minutes prior to your appointment time.  Please note: We ask at that you not bring children with you during ultrasound (echo/ vascular) testing. Due to room size and safety concerns, children are not allowed in the ultrasound rooms during exams. Our front office staff cannot provide observation of children in our lobby area while testing is being conducted. An adult accompanying a patient to their appointment will only be allowed in the ultrasound room at the discretion of the ultrasound technician under special circumstances. We apologize for any inconvenience.   Follow-Up: Your physician recommends that you schedule a follow-up appointment in: 3 months  Any Other Special Instructions Will Be Listed Below (If Applicable).  If you need a refill on your cardiac medications before your next appointment, please call your pharmacy.

## 2024-05-01 DIAGNOSIS — Z7901 Long term (current) use of anticoagulants: Secondary | ICD-10-CM | POA: Insufficient documentation

## 2024-05-01 NOTE — Progress Notes (Signed)
 Cardiology Office Note  Date: 05/01/2024   ID: Johnathan Richmond, DOB Apr 03, 1961, MRN [id number]  PCP:  Grooms, Charmaine, PA-C  Cardiologist:  Diannah SHAUNNA Maywood, MD Electrophysiologist:  None   History of Present Illness: Johnathan Richmond is a 63 y.o. male  Referred to cardiology clinic for the management of atrial flutter.  Diagnosed with atrial flutter with RVR in 2018 with no recurrence.  On diltiazem  and Eliquis .  Not taking.  Had to pay $600 per month for Eliquis .  Echocardiogram reviewed from 2020, normal LVEF and moderate to severe aortic valve stenosis was noted.  He reports exertional chest pain, DOE, dizziness for the last few months.  No prior ischemia evaluation was performed.  Serum creatinine within normal limits.  Past Medical History:  Diagnosis Date   Aortic stenosis    Diabetes mellitus (HCC)    Essential hypertension    Hypothyroidism    Morbid obesity (HCC)    Paroxysmal atrial flutter (HCC)     Past Surgical History:  Procedure Laterality Date   CHOLECYSTECTOMY      Current Outpatient Medications  Medication Sig Dispense Refill   insulin  glargine (LANTUS  SOLOSTAR) 100 UNIT/ML Solostar Pen Inject 20 Units into the skin daily. 15 mL 1   Insulin  Pen Needle (PEN NEEDLES) 31G X 5 MM MISC 1 each by Does not apply route daily. May substitute to any manufacturer covered by patient's insurance. 100 each 0   metFORMIN  (GLUCOPHAGE -XR) 750 MG 24 hr tablet Take 1 tablet (750 mg total) by mouth daily with breakfast. 90 tablet 0   warfarin (COUMADIN ) 3 MG tablet Take 1 tablet (3 mg total) by mouth daily. 30 tablet 5   diltiazem  (CARDIZEM  CD) 120 MG 24 hr capsule TAKE 1 CAPSULE(120 MG) BY MOUTH DAILY (Patient not taking: Reported on 04/30/2024) 90 capsule 0   No current facility-administered medications for this visit.   Allergies:  Patient has no known allergies.   Social History: The patient  reports that he has never smoked. He has never used smokeless  tobacco. He reports that he does not drink alcohol and does not use drugs.   Family History: The patient's family history includes COPD in his mother; Epilepsy in his father; Schizophrenia in his brother and mother.   ROS:  Please see the history of present illness. Otherwise, complete review of systems is positive for none  All other systems are reviewed and negative.   Physical Exam: VS:  BP 128/72   Pulse 77   Ht 6' 2 (1.88 m)   Wt 267 lb 6.4 oz (121.3 kg)   SpO2 98%   BMI 34.33 kg/m , BMI Body mass index is 34.33 kg/m.  Wt Readings from Last 3 Encounters:  04/30/24 267 lb 6.4 oz (121.3 kg)  04/02/24 264 lb 9.6 oz (120 kg)  01/02/24 256 lb (116.1 kg)    General: Patient appears comfortable at rest. HEENT: Conjunctiva and lids normal, oropharynx clear with moist mucosa. Neck: Supple, no elevated JVP or carotid bruits, no thyromegaly. Lungs: Clear to auscultation, nonlabored breathing at rest. Cardiac: Regular rate and rhythm, ESM with S2 Abdomen: Soft, nontender, no hepatomegaly, bowel sounds present, no guarding or rebound. Extremities: No pitting edema, distal pulses 2+. Skin: Warm and dry. Musculoskeletal: No kyphosis. Neuropsychiatric: Alert and oriented x3, affect grossly appropriate.  Recent Labwork: 01/02/2024: ALT 19; AST 16; BUN 11; Creatinine, Ser 0.97; Hemoglobin 15.1; Platelets 243; Potassium 4.4; Sodium 138     Component Value Date/Time  CHOL 190 01/02/2024 1120   TRIG 99 01/02/2024 1120   HDL 46 01/02/2024 1120   CHOLHDL 4.1 01/02/2024 1120   CHOLHDL 4.8 08/28/2019 1809   VLDL 19 08/28/2019 1809   LDLCALC 126 (H) 01/02/2024 1120     Assessment and Plan:  Paroxysmal atrial flutter - Diagnosed in 2018 with no recurrence. - Not on rate controlling agents. - Eliquis  is cost prohibitive, had to pay $600 per month.  Agreeable to initiating Coumadin .  Will start Coumadin  3 mg once daily and placed referral to Coumadin  clinic. - No bleeding issues and no  risk of falls.  Moderate to severe aortic valve stenosis in 2020 - Symptomatic with exertional chest pain, SOB, dizziness.  However physical exam findings consistent with moderate aortic valve stenosis. - Repeat echocardiogram. - If echocardiogram does not show severe atrial stenosis, he will benefit from ischemia evaluation with CT cardiac.  Cardiac risk factors include insulin -dependent diabetes mellitus type 2.   45 minutes spent in reviewing prior records, imaging, more than 3 labs, discussion of his symptoms, paroxysmal atrial flutter and documentation.  Medication Adjustments/Labs and Tests Ordered: Current medicines are reviewed at length with the patient today.  Concerns regarding medicines are outlined above.    Disposition:  Follow up 3 months  Signed Mykel Sponaugle Priya Rubbie Goostree, MD, 05/01/2024 2:58 PM    [facility] at [facility] [address] [place], [place], [place] [address]

## 2024-05-02 ENCOUNTER — Emergency Department (HOSPITAL_COMMUNITY)
Admission: EM | Admit: 2024-05-02 | Discharge: 2024-05-02 | Disposition: A | Discharge status: Home / Self Care | Payer: Self-pay | Source: Ambulatory Visit | Attending: Emergency Medicine | Admitting: Emergency Medicine

## 2024-05-02 ENCOUNTER — Ambulatory Visit: Payer: Self-pay | Attending: Internal Medicine

## 2024-05-02 ENCOUNTER — Encounter (HOSPITAL_COMMUNITY): Payer: Self-pay

## 2024-05-02 ENCOUNTER — Encounter: Payer: Self-pay | Admitting: Emergency Medicine

## 2024-05-02 ENCOUNTER — Ambulatory Visit
Admission: EM | Admit: 2024-05-02 | Discharge: 2024-05-02 | Disposition: A | Discharge status: Home / Self Care | Payer: Self-pay | Attending: Family Medicine | Admitting: Family Medicine

## 2024-05-02 ENCOUNTER — Emergency Department (HOSPITAL_COMMUNITY): Payer: Self-pay

## 2024-05-02 ENCOUNTER — Other Ambulatory Visit: Payer: Self-pay

## 2024-05-02 DIAGNOSIS — Z794 Long term (current) use of insulin: Secondary | ICD-10-CM | POA: Diagnosis not present

## 2024-05-02 DIAGNOSIS — R5383 Other fatigue: Secondary | ICD-10-CM | POA: Insufficient documentation

## 2024-05-02 DIAGNOSIS — R9431 Abnormal electrocardiogram [ECG] [EKG]: Secondary | ICD-10-CM

## 2024-05-02 DIAGNOSIS — Z7901 Long term (current) use of anticoagulants: Secondary | ICD-10-CM | POA: Diagnosis not present

## 2024-05-02 DIAGNOSIS — I10 Essential (primary) hypertension: Secondary | ICD-10-CM | POA: Diagnosis not present

## 2024-05-02 DIAGNOSIS — E119 Type 2 diabetes mellitus without complications: Secondary | ICD-10-CM | POA: Insufficient documentation

## 2024-05-02 DIAGNOSIS — I350 Nonrheumatic aortic (valve) stenosis: Secondary | ICD-10-CM

## 2024-05-02 DIAGNOSIS — R531 Weakness: Secondary | ICD-10-CM | POA: Diagnosis present

## 2024-05-02 DIAGNOSIS — Z7984 Long term (current) use of oral hypoglycemic drugs: Secondary | ICD-10-CM | POA: Insufficient documentation

## 2024-05-02 DIAGNOSIS — R739 Hyperglycemia, unspecified: Secondary | ICD-10-CM

## 2024-05-02 DIAGNOSIS — R42 Dizziness and giddiness: Secondary | ICD-10-CM

## 2024-05-02 LAB — BASIC METABOLIC PANEL WITH GFR
Anion gap: 12 (ref 5–15)
BUN: 15 mg/dL (ref 8–23)
CO2: 20 mmol/L — ABNORMAL LOW (ref 22–32)
Calcium: 8.9 mg/dL (ref 8.9–10.3)
Chloride: 105 mmol/L (ref 98–111)
Creatinine, Ser: 0.89 mg/dL (ref 0.61–1.24)
GFR, Estimated: >60 mL/min (ref >60)
Glucose, Bld: 241 mg/dL — ABNORMAL HIGH (ref 70–99)
Potassium: 3.7 mmol/L (ref 3.5–5.1)
Sodium: 137 mmol/L (ref 135–145)

## 2024-05-02 LAB — CBC
HCT: 43.8 % (ref 39.0–52.0)
Hemoglobin: 14.7 g/dL (ref 13.0–17.0)
MCH: 27.7 pg (ref 26.0–34.0)
MCHC: 33.6 g/dL (ref 30.0–36.0)
MCV: 82.5 fL (ref 80.0–100.0)
Platelets: 203 K/uL (ref 150–400)
RBC: 5.31 MIL/uL (ref 4.22–5.81)
RDW: 14.5 % (ref 11.5–15.5)
WBC: 4.9 K/uL (ref 4.0–10.5)
nRBC: 0 % (ref 0.0–0.2)

## 2024-05-02 LAB — TROPONIN I (HIGH SENSITIVITY)
Troponin I (High Sensitivity): 36 ng/L — ABNORMAL HIGH (ref <18)
Troponin I (High Sensitivity): 37 ng/L — ABNORMAL HIGH (ref <18)

## 2024-05-02 LAB — ECHOCARDIOGRAM COMPLETE
AR max vel: 0.87 cm2
AV Area VTI: 0.84 cm2
AV Area mean vel: 0.86 cm2
AV Mean grad: 39.5 mmHg
AV Peak grad: 65.8 mmHg
AV Vena cont: 0.6 cm
Ao pk vel: 4.06 m/s
Area-P 1/2: 3.74 cm2
Calc EF: 64.2 %
MV VTI: 3.14 cm2
P 1/2 time: 433 ms
S' Lateral: 2.8 cm
Single Plane A2C EF: 73.9 %
Single Plane A4C EF: 54.9 %

## 2024-05-02 LAB — GLUCOSE, POCT (MANUAL RESULT ENTRY): POC Glucose: 238 mg/dL — AB (ref 70–99)

## 2024-05-02 MED ORDER — LANTUS SOLOSTAR 100 UNIT/ML ~~LOC~~ SOPN: 20.0000 [IU] | PEN_INJECTOR | Freq: Every day | SUBCUTANEOUS | 1 refills | Status: DC

## 2024-05-02 MED ORDER — METFORMIN HCL ER 750 MG PO TB24: 750.0000 mg | ORAL_TABLET | Freq: Every day | ORAL | 0 refills | Status: DC

## 2024-05-02 MED ORDER — PEN NEEDLES 31G X 5 MM MISC: 1.0000 | Freq: Every day | 0 refills | Status: AC

## 2024-05-02 NOTE — Discharge Instructions (Addendum)
 Given your chronic heart conditions, risk factors and new EKG changes I recommend going to the emergency department for further evaluation of your symptoms.  We do not recommend that you drive yourself.

## 2024-05-02 NOTE — ED Provider Notes (Signed)
 [facility]    CSN: [id number] Arrival date & time: 05/02/24  1913      History   Chief Complaint Chief Complaint  Patient presents with   Weakness    HPI Johnathan Richmond is a 63 y.o. male.   Patient with past medical history significant for atrial flutter with RVR, aortic stenosis, uncontrolled diabetes, hypertension, hypothyroidism, obesity presenting today with several day history of progressively worsening generalized weakness, fatigue and overall just not feeling well.  He states someone at work recommended he go to get seen because he did not look good so he saw his cardiologist this morning.  Per note he had stopped his diltiazem  and Eliquis  due to cost for an unknown amount of time, was restarted on Coumadin  this morning by cardiology and an echocardiogram was ordered, ischemic workup being considered per note.  He states he has continued to feel worse throughout the day and wanted to come and get checked out.  He also notes that his blood sugars have been elevated.  Last meal was some cereal at 1:00 this afternoon.  He does have insulin -dependent diabetes.  He denies chest pain, shortness of breath, vomiting, diarrhea, diaphoresis.    Past Medical History:  Diagnosis Date   Aortic stenosis    Diabetes mellitus (HCC)    Essential hypertension    Hypothyroidism    Morbid obesity (HCC)    Paroxysmal atrial flutter [facility])     Patient Active Problem List   Diagnosis Date Noted   Long term (current) use of anticoagulants 05/01/2024   Aortic stenosis 08/29/2019   History of atrial flutter 08/29/2019   Subclinical hypothyroidism 08/29/2019   Essential hypertension 08/29/2019   Type 2 diabetes mellitus (HCC) 08/29/2019    Past Surgical History:  Procedure Laterality Date   CHOLECYSTECTOMY         Home Medications    Prior to Admission medications   Medication Sig Start Date End Date Taking? Authorizing Provider  diltiazem  (CARDIZEM  CD) 120 MG 24 hr  capsule TAKE 1 CAPSULE(120 MG) BY MOUTH DAILY Patient not taking: Reported on 04/30/2024 02/08/24   Grooms, Courtney, PA-C  insulin  glargine (LANTUS  SOLOSTAR) 100 UNIT/ML Solostar Pen Inject 20 Units into the skin daily. 04/02/24   Grooms, Courtney, PA-C  Insulin  Pen Needle (PEN NEEDLES) 31G X 5 MM MISC 1 each by Does not apply route daily. May substitute to any manufacturer covered by patient's insurance. 01/02/24   Grooms, Courtney, PA-C  metFORMIN  (GLUCOPHAGE -XR) 750 MG 24 hr tablet Take 1 tablet (750 mg total) by mouth daily with breakfast. 04/02/24   Grooms, Courtney, PA-C  warfarin (COUMADIN ) 3 MG tablet Take 1 tablet (3 mg total) by mouth daily. 04/30/24   Mallipeddi, Diannah SQUIBB, MD    Family History Family History  Problem Relation Age of Onset   Schizophrenia Mother    COPD Mother    Epilepsy Father    Schizophrenia Brother     Social History Social History   Tobacco Use   Smoking status: Never   Smokeless tobacco: Never  Vaping Use   Vaping status: Never Used  Substance Use Topics   Alcohol use: No   Drug use: No     Allergies   Patient has no known allergies.   Review of Systems Review of Systems Per HPI  Physical Exam Triage Vital Signs ED Triage Vitals  Encounter Vitals Group     BP 05/02/24 1922 (!) 127/55     Girls Systolic BP Percentile --  Girls Diastolic BP Percentile --      Boys Systolic BP Percentile --      Boys Diastolic BP Percentile --      Pulse Rate 05/02/24 1922 75     Resp 05/02/24 1922 20     Temp 05/02/24 1922 97.7 F (36.5 C)     Temp Source 05/02/24 1922 Oral     SpO2 05/02/24 1922 95 %     Weight --      Height --      Head Circumference --      Peak Flow --      Pain Score 05/02/24 1923 0     Pain Loc --      Pain Education --      Exclude from Growth Chart --    Orthostatic VS for the past 24 hrs:  BP- Lying Pulse- Lying BP- Sitting Pulse- Sitting BP- Standing at 0 minutes Pulse- Standing at 0 minutes  05/02/24 1926  121/68 76 116/69 74 120/62 77    Updated Vital Signs BP (!) 127/55 (BP Location: Right Arm)   Pulse 75   Temp 97.7 F (36.5 C) (Oral)   Resp 20   SpO2 95%   Visual Acuity Right Eye Distance:   Left Eye Distance:   Bilateral Distance:    Right Eye Near:   Left Eye Near:    Bilateral Near:     Physical Exam Vitals and nursing note reviewed.  Constitutional:      Appearance: Normal appearance.  HENT:     Head: Atraumatic.  Eyes:     Extraocular Movements: Extraocular movements intact.     Conjunctiva/sclera: Conjunctivae normal.  Cardiovascular:     Rate and Rhythm: Normal rate and regular rhythm.  Pulmonary:     Effort: Pulmonary effort is normal.  Musculoskeletal:        General: Normal range of motion.     Cervical back: Normal range of motion and neck supple.  Skin:    General: Skin is warm and dry.  Neurological:     Mental Status: He is oriented to person, place, and time.  Psychiatric:        Mood and Affect: Mood normal.        Thought Content: Thought content normal.        Judgment: Judgment normal.      UC Treatments / Results  Labs (all labs ordered are listed, but only abnormal results are displayed) Labs Reviewed  GLUCOSE, POCT (MANUAL RESULT ENTRY) - Abnormal; Notable for the following components:      Result Value   POC Glucose 238 (*)    All other components within normal limits    EKG   Radiology ECHOCARDIOGRAM COMPLETE Result Date: 05/02/2024    ECHOCARDIOGRAM REPORT   Patient Name:   Johnathan Richmond Date of Exam: 05/02/2024 Medical Rec #:  [id number]        Height:       74.0 in Accession #:    [id number]       Weight:       267.4 lb Date of Birth:  1961/07/05       BSA:          2.459 m Patient Age:    62 years         BP:           128/72 mmHg Patient Gender: M  HR:           71 bpm. Exam Location:  [place] Procedure: 2D Echo, Cardiac Doppler and Color Doppler (Both Spectral and Color            Flow Doppler were utilized  during procedure). Indications:    I35.0 Nonrheumatic aortic (valve) stenosis  History:        Patient has prior history of Echocardiogram examinations, most                 recent 08/29/2019. Aortic Valve Disease, Arrythmias:Atrial                 Fibrillation and Atrial Flutter, Signs/Symptoms:Chest Pain,                 Dyspnea and Dizziness/Lightheadedness; Risk                 Factors:Hypertension, Diabetes, Dyslipidemia, Morbid obesity and                 Non-Smoker.  Sonographer:    Bascom Burows RCS, RVS Referring Phys: [id number] VISHNU P MALLIPEDDI  Sonographer Comments: Global longitudinal strain was attempted. IMPRESSIONS  1. Left ventricular ejection fraction, by estimation, is 65 to 70%. The left ventricle has normal function. The left ventricle has no regional wall motion abnormalities. There is moderate concentric left ventricular hypertrophy. Left ventricular diastolic parameters are indeterminate.  2. Right ventricular systolic function is normal. The right ventricular size is normal. There is normal pulmonary artery systolic pressure.  3. The mitral valve is grossly normal. Mild mitral valve regurgitation.  4. The aortic valve has an indeterminant number of cusps, possibly bicuspid. There is severe calcifcation of the aortic valve. Aortic valve regurgitation is mild to moderate. Severe aortic valve stenosis. Aortic valve mean gradient measures 39.5 mmHg. Aortic valve Vmax measures 4.06 m/s. Dimentionless index 0.24.  5. Aortic dilatation noted. There is mild dilatation of the ascending aorta, measuring 38 mm.  6. The inferior vena cava is normal in size with greater than 50% respiratory variability, suggesting right atrial pressure of 3 mmHg. Comparison(s): A prior study was performed on 08/29/2019. Prior images unable to be directly viewed. Aortic valve structure not well defined, possibly bicuspid with evidence of severe stenosis. Mean AV gradient 39.5 mmHg and DI 0.24. Mild to moderate aortic  regurgitation. FINDINGS  Left Ventricle: Left ventricular ejection fraction, by estimation, is 65 to 70%. The left ventricle has normal function. The left ventricle has no regional wall motion abnormalities. The left ventricular internal cavity size was normal in size. There is  moderate concentric left ventricular hypertrophy. Left ventricular diastolic parameters are indeterminate. Right Ventricle: The right ventricular size is normal. No increase in right ventricular wall thickness. Right ventricular systolic function is normal. There is normal pulmonary artery systolic pressure. The tricuspid regurgitant velocity is 2.85 m/s, and  with an assumed right atrial pressure of 3 mmHg, the estimated right ventricular systolic pressure is 35.5 mmHg. Left Atrium: Left atrial size was normal in size. Right Atrium: Right atrial size was normal in size. Pericardium: Trivial pericardial effusion is present. The pericardial effusion is posterior to the left ventricle. Mitral Valve: The mitral valve is grossly normal. Mild mitral valve regurgitation. MV peak gradient, 2.8 mmHg. The mean mitral valve gradient is 1.0 mmHg. Tricuspid Valve: The tricuspid valve is grossly normal. Tricuspid valve regurgitation is mild. Aortic Valve: The aortic valve has an indeterminant number of cusps. There is severe calcifcation of the aortic valve.  There is mild aortic valve annular calcification. Aortic valve regurgitation is mild to moderate. Aortic regurgitation PHT measures 433  msec. Severe aortic stenosis is present. Aortic valve mean gradient measures 39.5 mmHg. Aortic valve peak gradient measures 65.8 mmHg. Aortic valve area, by VTI measures 0.84 cm. Pulmonic Valve: The pulmonic valve was grossly normal. Pulmonic valve regurgitation is trivial. Aorta: Aortic dilatation noted. There is mild dilatation of the ascending aorta, measuring 38 mm. Venous: The inferior vena cava is normal in size with greater than 50% respiratory variability,  suggesting right atrial pressure of 3 mmHg. IAS/Shunts: No atrial level shunt detected by color flow Doppler. Additional Comments: 3D was performed not requiring image post processing on an independent workstation and was indeterminate.  LEFT VENTRICLE PLAX 2D LVIDd:         5.00 cm     Diastology LVIDs:         2.80 cm     LV e' medial:    4.79 cm/s LV PW:         1.40 cm     LV E/e' medial:  16.6 LV IVS:        1.40 cm     LV e' lateral:   5.55 cm/s LVOT diam:     2.10 cm     LV E/e' lateral: 14.3 LV SV:         78 LV SV Index:   32 LVOT Area:     3.46 cm  LV Volumes (MOD) LV vol d, MOD A2C: 68.6 ml LV vol d, MOD A4C: 65.4 ml LV vol s, MOD A2C: 17.9 ml LV vol s, MOD A4C: 29.5 ml LV SV MOD A2C:     50.7 ml LV SV MOD A4C:     65.4 ml LV SV MOD BP:      43.2 ml RIGHT VENTRICLE             IVC RV Basal diam:  3.50 cm     IVC diam: 1.90 cm RV Mid diam:    2.70 cm RV S prime:     17.00 cm/s TAPSE (M-mode): 3.2 cm LEFT ATRIUM             Index        RIGHT ATRIUM           Index LA diam:        4.20 cm 1.71 cm/m   RA Area:     13.20 cm LA Vol (A2C):   59.8 ml 24.32 ml/m  RA Volume:   27.90 ml  11.35 ml/m LA Vol (A4C):   66.2 ml 26.92 ml/m LA Biplane Vol: 63.7 ml 25.90 ml/m  AORTIC VALVE                     PULMONIC VALVE AV Area (Vmax):    0.87 cm      PV Vmax:       1.24 m/s AV Area (Vmean):   0.86 cm      PV Peak grad:  6.1 mmHg AV Area (VTI):     0.84 cm AV Vmax:           405.50 cm/s AV Vmean:          297.500 cm/s AV VTI:            0.932 m AV Peak Grad:      65.8 mmHg AV Mean Grad:      39.5 mmHg LVOT Vmax:  101.45 cm/s LVOT Vmean:        73.750 cm/s LVOT VTI:          0.226 m LVOT/AV VTI ratio: 0.24 AI PHT:            433 msec AR Vena Contracta: 0.60 cm  AORTA Ao Root diam: 3.10 cm Ao Asc diam:  3.80 cm MITRAL VALVE               TRICUSPID VALVE MV Area (PHT): 3.74 cm    TR Peak grad:   32.5 mmHg MV Area VTI:   3.14 cm    TR Vmax:        285.00 cm/s MV Peak grad:  2.8 mmHg MV Mean grad:  1.0  mmHg    SHUNTS MV Vmax:       0.84 m/s    Systemic VTI:  0.23 m MV Vmean:      49.4 cm/s   Systemic Diam: 2.10 cm MV Decel Time: 203 msec MV E velocity: 79.30 cm/s MV A velocity: 67.70 cm/s MV E/A ratio:  1.17 Jayson Sierras MD Electronically signed by Jayson Sierras MD Signature Date/Time: 05/02/2024/3:12:16 PM    Final     Procedures Procedures (including critical care time)  Medications Ordered in UC Medications - No data to display  Initial Impression / Assessment and Plan / UC Course  I have reviewed the triage vital signs and the nursing notes.  Pertinent labs & imaging results that were available during my care of the patient were reviewed by me and considered in my medical decision making (see chart for details).     Overall vital signs within normal limits in triage, his blood sugar is moderately elevated at 238 but do not feel this level would be causing his significant and worsening symptoms.  His orthostatic vital signs are without significant change.  Exam was abbreviated today as decision was made after EKG for him to go to the emergency department for further evaluation.  He does appear to have some new nonspecific changes when compared to last EKG 2 days ago on file with some ST and T wave changes in the anterolateral leads.'s EKG does show normal sinus rhythm at 74 bpm currently.  He is agreeable to going to the emergency department for further evaluation as we discussed that we could not obtain same-day labs or further cardiac testing in this setting.  He declines EMS transport today and is hemodynamically stable for private vehicle transport.  Final Clinical Impressions(s) / UC Diagnoses   Final diagnoses:  Lightheaded  Other fatigue  Nonspecific abnormal electrocardiogram (ECG) (EKG)  Hyperglycemia     Discharge Instructions      Given your chronic heart conditions, risk factors and new EKG changes I recommend going to the emergency department for further  evaluation of your symptoms.  We do not recommend that you drive yourself.    ED Prescriptions   None    PDMP not reviewed this encounter.   Stuart Vernell Norris, [place] 05/02/24 1940

## 2024-05-02 NOTE — ED Triage Notes (Signed)
 Patient reports generalized weakness for the last 3-4 days and he said that he wants his heart checked for a flap that is not fully opening. He said that he does have some dizziness from time to time.

## 2024-05-02 NOTE — ED Provider Notes (Signed)
 [place] EMERGENCY DEPARTMENT AT [facility] Provider Note   CSN: [id number] Arrival date & time: 05/02/24  8048     Patient presents with: Weakness   Johnathan Richmond is a 63 y.o. male.   Patient is a 63 year old male with past medical history of aortic stenosis, type 2 diabetes, atrial flutter, hypertension.  Patient presenting today with complaints of weakness and fatigue.  Symptoms worsening over the past several days.  He denies any chest pain or shortness of breath.  He had an echocardiogram performed this morning showing aortic stenosis and aortic regurgitation.  No fevers or chills.  No productive cough.  He was seen at urgent care, then referred here for further evaluation.       Prior to Admission medications   Medication Sig Start Date End Date Taking? Authorizing Provider  diltiazem  (CARDIZEM  CD) 120 MG 24 hr capsule TAKE 1 CAPSULE(120 MG) BY MOUTH DAILY Patient not taking: Reported on 04/30/2024 02/08/24   Grooms, [place], PA-C  insulin  glargine (LANTUS  SOLOSTAR) 100 UNIT/ML Solostar Pen Inject 20 Units into the skin daily. 04/02/24   Grooms, Courtney, PA-C  Insulin  Pen Needle (PEN NEEDLES) 31G X 5 MM MISC 1 each by Does not apply route daily. May substitute to any manufacturer covered by patient's insurance. 01/02/24   Grooms, Courtney, PA-C  metFORMIN  (GLUCOPHAGE -XR) 750 MG 24 hr tablet Take 1 tablet (750 mg total) by mouth daily with breakfast. 04/02/24   Grooms, Courtney, PA-C  warfarin (COUMADIN ) 3 MG tablet Take 1 tablet (3 mg total) by mouth daily. 04/30/24   Mallipeddi, Diannah SQUIBB, MD    Allergies: Patient has no known allergies.    Review of Systems  All other systems reviewed and are negative.   Updated Vital Signs BP (!) 141/59   Pulse 70   Temp 97.9 F (36.6 C) (Oral)   Resp 18   Ht 6' 2 (1.88 m)   Wt 121.1 kg   SpO2 99%   BMI 34.28 kg/m   Physical Exam Vitals and nursing note reviewed.  Constitutional:      General: He is not in acute  distress.    Appearance: He is well-developed. He is not diaphoretic.  HENT:     Head: Normocephalic and atraumatic.  Cardiovascular:     Rate and Rhythm: Normal rate and regular rhythm.     Heart sounds: Murmur heard.     No friction rub.  Pulmonary:     Effort: Pulmonary effort is normal. No respiratory distress.     Breath sounds: Normal breath sounds. No wheezing or rales.  Abdominal:     General: Bowel sounds are normal. There is no distension.     Palpations: Abdomen is soft.     Tenderness: There is no abdominal tenderness.  Musculoskeletal:        General: Normal range of motion.     Cervical back: Normal range of motion and neck supple.  Skin:    General: Skin is warm and dry.  Neurological:     Mental Status: He is alert and oriented to person, place, and time.     Coordination: Coordination normal.     (all labs ordered are listed, but only abnormal results are displayed) Labs Reviewed  BASIC METABOLIC PANEL WITH GFR - Abnormal; Notable for the following components:      Result Value   CO2 20 (*)    Glucose, Bld 241 (*)    All other components within normal limits  TROPONIN  I (HIGH SENSITIVITY) - Abnormal; Notable for the following components:   Troponin I (High Sensitivity) 36 (*)    All other components within normal limits  TROPONIN I (HIGH SENSITIVITY) - Abnormal; Notable for the following components:   Troponin I (High Sensitivity) 37 (*)    All other components within normal limits  CBC    EKG: None  Radiology: DG Chest 2 View Result Date: 05/02/2024 CLINICAL DATA:  Weakness EXAM: CHEST - 2 VIEW COMPARISON:  09/20/2022 FINDINGS: The heart size and mediastinal contours are within normal limits. Aortic atherosclerosis. Both lungs are clear. The visualized skeletal structures are unremarkable. IMPRESSION: No active cardiopulmonary disease. Electronically Signed   By: Luke Bun M.D.   On: 05/02/2024 20:40   ECHOCARDIOGRAM COMPLETE Result Date:  05/02/2024    ECHOCARDIOGRAM REPORT   Patient Name:   Johnathan Richmond Date of Exam: 05/02/2024 Medical Rec #:  [id number]        Height:       74.0 in Accession #:    [id number]       Weight:       267.4 lb Date of Birth:  12-Oct-1960       BSA:          2.459 m Patient Age:    62 years         BP:           128/72 mmHg Patient Gender: M                HR:           71 bpm. Exam Location:  [place] Procedure: 2D Echo, Cardiac Doppler and Color Doppler (Both Spectral and Color            Flow Doppler were utilized during procedure). Indications:    I35.0 Nonrheumatic aortic (valve) stenosis  History:        Patient has prior history of Echocardiogram examinations, most                 recent 08/29/2019. Aortic Valve Disease, Arrythmias:Atrial                 Fibrillation and Atrial Flutter, Signs/Symptoms:Chest Pain,                 Dyspnea and Dizziness/Lightheadedness; Risk                 Factors:Hypertension, Diabetes, Dyslipidemia, Morbid obesity and                 Non-Smoker.  Sonographer:    Bascom Burows RCS, RVS Referring Phys: [id number] VISHNU P MALLIPEDDI  Sonographer Comments: Global longitudinal strain was attempted. IMPRESSIONS  1. Left ventricular ejection fraction, by estimation, is 65 to 70%. The left ventricle has normal function. The left ventricle has no regional wall motion abnormalities. There is moderate concentric left ventricular hypertrophy. Left ventricular diastolic parameters are indeterminate.  2. Right ventricular systolic function is normal. The right ventricular size is normal. There is normal pulmonary artery systolic pressure.  3. The mitral valve is grossly normal. Mild mitral valve regurgitation.  4. The aortic valve has an indeterminant number of cusps, possibly bicuspid. There is severe calcifcation of the aortic valve. Aortic valve regurgitation is mild to moderate. Severe aortic valve stenosis. Aortic valve mean gradient measures 39.5 mmHg. Aortic valve Vmax measures 4.06 m/s.  Dimentionless index 0.24.  5. Aortic dilatation noted. There is mild dilatation of the ascending aorta, measuring 38 mm.  6. The inferior vena cava is normal in size with greater than 50% respiratory variability, suggesting right atrial pressure of 3 mmHg. Comparison(s): A prior study was performed on 08/29/2019. Prior images unable to be directly viewed. Aortic valve structure not well defined, possibly bicuspid with evidence of severe stenosis. Mean AV gradient 39.5 mmHg and DI 0.24. Mild to moderate aortic regurgitation. FINDINGS  Left Ventricle: Left ventricular ejection fraction, by estimation, is 65 to 70%. The left ventricle has normal function. The left ventricle has no regional wall motion abnormalities. The left ventricular internal cavity size was normal in size. There is  moderate concentric left ventricular hypertrophy. Left ventricular diastolic parameters are indeterminate. Right Ventricle: The right ventricular size is normal. No increase in right ventricular wall thickness. Right ventricular systolic function is normal. There is normal pulmonary artery systolic pressure. The tricuspid regurgitant velocity is 2.85 m/s, and  with an assumed right atrial pressure of 3 mmHg, the estimated right ventricular systolic pressure is 35.5 mmHg. Left Atrium: Left atrial size was normal in size. Right Atrium: Right atrial size was normal in size. Pericardium: Trivial pericardial effusion is present. The pericardial effusion is posterior to the left ventricle. Mitral Valve: The mitral valve is grossly normal. Mild mitral valve regurgitation. MV peak gradient, 2.8 mmHg. The mean mitral valve gradient is 1.0 mmHg. Tricuspid Valve: The tricuspid valve is grossly normal. Tricuspid valve regurgitation is mild. Aortic Valve: The aortic valve has an indeterminant number of cusps. There is severe calcifcation of the aortic valve. There is mild aortic valve annular calcification. Aortic valve regurgitation is mild to  moderate. Aortic regurgitation PHT measures 433  msec. Severe aortic stenosis is present. Aortic valve mean gradient measures 39.5 mmHg. Aortic valve peak gradient measures 65.8 mmHg. Aortic valve area, by VTI measures 0.84 cm. Pulmonic Valve: The pulmonic valve was grossly normal. Pulmonic valve regurgitation is trivial. Aorta: Aortic dilatation noted. There is mild dilatation of the ascending aorta, measuring 38 mm. Venous: The inferior vena cava is normal in size with greater than 50% respiratory variability, suggesting right atrial pressure of 3 mmHg. IAS/Shunts: No atrial level shunt detected by color flow Doppler. Additional Comments: 3D was performed not requiring image post processing on an independent workstation and was indeterminate.  LEFT VENTRICLE PLAX 2D LVIDd:         5.00 cm     Diastology LVIDs:         2.80 cm     LV e' medial:    4.79 cm/s LV PW:         1.40 cm     LV E/e' medial:  16.6 LV IVS:        1.40 cm     LV e' lateral:   5.55 cm/s LVOT diam:     2.10 cm     LV E/e' lateral: 14.3 LV SV:         78 LV SV Index:   32 LVOT Area:     3.46 cm  LV Volumes (MOD) LV vol d, MOD A2C: 68.6 ml LV vol d, MOD A4C: 65.4 ml LV vol s, MOD A2C: 17.9 ml LV vol s, MOD A4C: 29.5 ml LV SV MOD A2C:     50.7 ml LV SV MOD A4C:     65.4 ml LV SV MOD BP:      43.2 ml RIGHT VENTRICLE             IVC RV Basal diam:  3.50 cm  IVC diam: 1.90 cm RV Mid diam:    2.70 cm RV S prime:     17.00 cm/s TAPSE (M-mode): 3.2 cm LEFT ATRIUM             Index        RIGHT ATRIUM           Index LA diam:        4.20 cm 1.71 cm/m   RA Area:     13.20 cm LA Vol (A2C):   59.8 ml 24.32 ml/m  RA Volume:   27.90 ml  11.35 ml/m LA Vol (A4C):   66.2 ml 26.92 ml/m LA Biplane Vol: 63.7 ml 25.90 ml/m  AORTIC VALVE                     PULMONIC VALVE AV Area (Vmax):    0.87 cm      PV Vmax:       1.24 m/s AV Area (Vmean):   0.86 cm      PV Peak grad:  6.1 mmHg AV Area (VTI):     0.84 cm AV Vmax:           405.50 cm/s AV Vmean:           297.500 cm/s AV VTI:            0.932 m AV Peak Grad:      65.8 mmHg AV Mean Grad:      39.5 mmHg LVOT Vmax:         101.45 cm/s LVOT Vmean:        73.750 cm/s LVOT VTI:          0.226 m LVOT/AV VTI ratio: 0.24 AI PHT:            433 msec AR Vena Contracta: 0.60 cm  AORTA Ao Root diam: 3.10 cm Ao Asc diam:  3.80 cm MITRAL VALVE               TRICUSPID VALVE MV Area (PHT): 3.74 cm    TR Peak grad:   32.5 mmHg MV Area VTI:   3.14 cm    TR Vmax:        285.00 cm/s MV Peak grad:  2.8 mmHg MV Mean grad:  1.0 mmHg    SHUNTS MV Vmax:       0.84 m/s    Systemic VTI:  0.23 m MV Vmean:      49.4 cm/s   Systemic Diam: 2.10 cm MV Decel Time: 203 msec MV E velocity: 79.30 cm/s MV A velocity: 67.70 cm/s MV E/A ratio:  1.17 Jayson Sierras MD Electronically signed by Jayson Sierras MD Signature Date/Time: 05/02/2024/3:12:16 PM    Final      Procedures   Medications Ordered in the ED - No data to display                                  Medical Decision Making Amount and/or Complexity of Data Reviewed Labs: ordered. Radiology: ordered.   Patient presenting here with complaints of weakness and fatigue as described in the HPI.  He has history of aortic stenosis and aortic regurgitation and had an echocardiogram this morning.  Patient arrives here with stable vital signs and is afebrile.  Physical examination basically unremarkable with the exception of heart murmur.  Laboratory studies obtained including CBC, metabolic panel, and troponin x 2.  All results are basically unremarkable with the exception of a glucose of 241 and troponin of 36, then repeated at 37.  Chest x-ray showing no acute process.  At this point, patient appears clinically well and is not complaining of any chest pain.  He does have mild elevation of his troponin, however has not increased.  I am uncertain as to the significance of this, but suspect may be related to his aortic stenosis.  Patient has an established cardiologist who  ordered this study.  I will have him speak with them in the morning to discuss the next appropriate course of action.  Highly doubt ACS.     Final diagnoses:  None    ED Discharge Orders     None          Geroldine Berg, MD 05/02/24 2334

## 2024-05-02 NOTE — ED Notes (Signed)
 Patient is being discharged from the Urgent Care and sent to the Emergency Department via POV . Per PA, patient is in need of higher level of care due to EKG changes. Patient is aware and verbalizes understanding of plan of care.  Vitals:   05/02/24 1922  BP: (!) 127/55  Pulse: 75  Resp: 20  Temp: 97.7 F (36.5 C)  SpO2: 95%   Pt declined EMS.

## 2024-05-02 NOTE — ED Triage Notes (Addendum)
 Pt reports generalized weakness, fatigue and is concerned for elevated blood sugar. Pt reports last checked blood glucose x3 days.   Pt reports was seen at [facility] this am and reports had EKG performed. Reports told provider at that time and reports my heart flap is not fully opening.   Last ate cereal at approx 1300 today.

## 2024-05-02 NOTE — Discharge Instructions (Signed)
 Continue medications as previously prescribed.  Follow-up with your cardiologist tomorrow to discuss the results of your echocardiogram.  Return to the ER if you develop chest pain, difficulty breathing, or for other new and concerning symptoms.

## 2024-05-07 ENCOUNTER — Encounter: Payer: Self-pay | Admitting: Internal Medicine

## 2024-05-09 ENCOUNTER — Ambulatory Visit: Payer: Self-pay | Admitting: Internal Medicine

## 2024-05-09 DIAGNOSIS — I350 Nonrheumatic aortic (valve) stenosis: Secondary | ICD-10-CM

## 2024-05-22 ENCOUNTER — Ambulatory Visit: Payer: Self-pay | Attending: Cardiology | Admitting: *Deleted

## 2024-05-22 DIAGNOSIS — Z8679 Personal history of other diseases of the circulatory system: Secondary | ICD-10-CM

## 2024-05-22 DIAGNOSIS — Z5181 Encounter for therapeutic drug level monitoring: Secondary | ICD-10-CM | POA: Insufficient documentation

## 2024-05-22 LAB — POCT INR: INR: 1 — AB (ref 2.0–3.0)

## 2024-05-22 NOTE — Patient Instructions (Signed)
 Previously on Eliquis .  Stopped due to cost.  Started on warfarin 3mg  daily on 04/30/24 by Dr Mallipeddi. Increase warfarin to 2 tablets daily except 1 tablet on Sundays, Tuesdays and Thursdays.

## 2024-05-22 NOTE — Progress Notes (Signed)
 INR 1.0; Please see anticoagulation encounter

## 2024-05-29 ENCOUNTER — Ambulatory Visit: Payer: Self-pay | Attending: Internal Medicine | Admitting: *Deleted

## 2024-05-29 DIAGNOSIS — Z5181 Encounter for therapeutic drug level monitoring: Secondary | ICD-10-CM

## 2024-05-29 DIAGNOSIS — Z8679 Personal history of other diseases of the circulatory system: Secondary | ICD-10-CM

## 2024-05-29 LAB — POCT INR: INR: 1.2 — AB (ref 2.0–3.0)

## 2024-05-29 NOTE — Progress Notes (Signed)
 INR 1.2; Please see anticoagulation encounter

## 2024-05-29 NOTE — Patient Instructions (Signed)
 Previously on Eliquis .  Stopped due to cost.  Started on warfarin 3mg  daily on 04/30/24 by Dr Mallipeddi. Increase warfarin to 2 tablets daily . Recheck in 1 wk

## 2024-05-30 ENCOUNTER — Ambulatory Visit: Payer: Self-pay | Attending: Cardiovascular Disease | Admitting: Cardiovascular Disease

## 2024-05-30 ENCOUNTER — Telehealth: Payer: Self-pay | Admitting: Licensed Clinical Social Worker

## 2024-05-30 ENCOUNTER — Telehealth: Payer: Self-pay | Admitting: Pharmacy Technician

## 2024-05-30 ENCOUNTER — Other Ambulatory Visit (HOSPITAL_COMMUNITY): Payer: Self-pay

## 2024-05-30 VITALS — BP 144/78 | HR 70 | Ht 74.0 in | Wt 269.0 lb

## 2024-05-30 DIAGNOSIS — I350 Nonrheumatic aortic (valve) stenosis: Secondary | ICD-10-CM

## 2024-05-30 NOTE — Progress Notes (Signed)
 [facility] Consult Note  Chief Complaint  Patient presents with   New Patient (Initial Visit)    Aortic stenosis   History of Present Illness: 63 yo male with history of DM, HTN, obesity, paroxysmal atrial flutter and aortic stenosis who is here today as a new consult, referred by Dr. Mallipeddi, for further discussion regarding his aortic stenosis. He has paroxysmal atrial flutter diagnosed in 2018. He was found to have moderate aortic stenosis in 2020. He was lost to cardiology follow up from 2020 until 2025 and reported not taking Eliquis  due to cost. He was seen 04/30/24 by Dr. Stacia and c/o dyspnea and chest pain. He was started on coumadin . Echo 05/02/24 with LVEF=65-70%. Mild mitral regurgitation. Likely bicuspid aortic valve with mild to moderate regurgitation and severe aortic stenosis (mean gradient 39.5 mmHg, AVA 0.84 cm2, SVI 27, DI 0.24).   He tells me today that he has progressive dyspnea with exertion and chest pain. He has some dizziness. No near syncope. He has fatigue. He lives in [place], [place]. He lives alone. 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 He has no active dental issues.   Primary Care Physician: G[facility], [place], [place] Primary Cardiologist: Mallipeddi Referring Cardiologist: Mallipeddi  Past Medical History:  Diagnosis Date   Aortic stenosis    Diabetes mellitus (HCC)    Essential hypertension    Hypothyroidism    Morbid obesity (HCC)    Paroxysmal atrial flutter (HCC)     Past Surgical History:  Procedure Laterality Date   CHOLECYSTECTOMY      Current Outpatient Medications  Medication Sig Dispense Refill   metFORMIN  (GLUCOPHAGE -XR) 750 MG 24 hr tablet Take 1 tablet (750 mg total) by mouth daily with breakfast. 90 tablet 0   warfarin (COUMADIN ) 3 MG tablet Take 1 tablet (3 mg total) by mouth daily. 30 tablet 5   diltiazem  (CARDIZEM  CD) 120 MG 24 hr capsule TAKE 1 CAPSULE(120 MG) BY MOUTH DAILY (Patient not  taking: Reported on 05/30/2024) 90 capsule 0   insulin  glargine (LANTUS  SOLOSTAR) 100 UNIT/ML Solostar Pen Inject 20 Units into the skin daily. (Patient not taking: Reported on 05/30/2024) 15 mL 1   Insulin  Pen Needle (PEN NEEDLES) 31G X 5 MM MISC 1 each by Does not apply route daily. May substitute to any manufacturer covered by patient's insurance. (Patient not taking: Reported on 05/30/2024) 100 each 0   No current facility-administered medications for this visit.    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  Tobacco Use   Smoking status: Never   Smokeless tobacco: Never  Vaping Use   Vaping status: Never Used  Substance and Sexual Activity   Alcohol use: No   Drug use: No   Sexual activity: Not on file  Other Topics Concern   Not on file  Social History Narrative   Not on file   Social Drivers of Health   Financial Resource Strain: Not on file  Food Insecurity: Not on file  Transportation Needs: Not on file  Physical Activity: Not on file  Stress: Not on file  Social Connections: Not on file  Intimate Partner Violence: Not on file    Family History  Problem Relation Age of Onset   Schizophrenia Mother    COPD Mother    Epilepsy Father    Schizophrenia Brother     Review of Systems:  As stated in the HPI and otherwise negative.   BP (!) 144/78 (BP Location: Left Arm, Patient Position: Sitting)   Pulse 70   Ht 6' 2 (1.88 m)   Wt 269 lb (122 kg)   SpO2 97%   BMI 34.54 kg/m   Physical Examination: General: Well developed, well nourished, NAD  HEENT: OP clear, mucus membranes moist  SKIN: warm, dry. No rashes. Neuro: No focal deficits  Musculoskeletal: Muscle strength 5/5 all ext  Psychiatric: Mood and affect  normal  Neck: No JVD, no carotid bruits, no thyromegaly, no lymphadenopathy.  Lungs:Clear bilaterally, no wheezes, rhonci, crackles Cardiovascular: Regular rate and rhythm. Loud, harsh, late peaking systolic murmur.  Abdomen:Soft. Bowel sounds present. Non-tender.  Extremities:  No lower extremity edema. Pulses are 2 + in the bilateral DP/PT.  EKG:  EKG is ordered today. The ekg ordered today demonstrates  EKG Interpretation Date/Time:  Thursday May 30 2024 08:47:24 EDT Ventricular Rate:  68 PR Interval:  170 QRS Duration:  92 QT Interval:  372 QTC Calculation: 395 R Axis:   -35  Text Interpretation: Normal sinus rhythm Incomplete right bundle branch block Non-specific T wave abnormality Confirmed by Verlin Bruckner [phone]) on 05/30/2024 9:25:14 AM   Echo 05/02/24:  1. Left ventricular ejection fraction, by estimation, is 65 to 70%. The  left ventricle has normal function. The left ventricle has no regional  wall motion abnormalities. There is moderate concentric left ventricular  hypertrophy. Left ventricular  diastolic parameters are indeterminate.   2. Right ventricular systolic function is normal. The right ventricular  size is normal. There is normal pulmonary artery systolic pressure.   3. The mitral valve is grossly normal. Mild mitral valve regurgitation.   4. The aortic valve has an indeterminant number of cusps, possibly  bicuspid. There is severe calcifcation of the aortic valve. Aortic valve  regurgitation is mild to moderate. Severe aortic valve stenosis. Aortic  valve mean gradient measures 39.5 mmHg.  Aortic valve Vmax measures 4.06 m/s. Dimentionless index 0.24.   5. Aortic dilatation noted. There is mild dilatation of the ascending  aorta, measuring 38 mm.   6. The inferior vena cava is normal in size with greater than 50%  respiratory variability, suggesting right atrial pressure of 3 mmHg.   Comparison(s): A prior study was performed on 08/29/2019.  Prior images  unable to be directly viewed. Aortic valve structure not well defined,  possibly bicuspid with evidence of severe stenosis. Mean AV gradient 39.5  mmHg and DI 0.24. Mild to moderate  aortic regurgitation.   FINDINGS   Left Ventricle: Left ventricular ejection fraction, by estimation, is 65  to 70%. The left ventricle has normal function. The left ventricle has no  regional wall motion abnormalities. The left ventricular internal cavity  size was normal in size. There is   moderate concentric left ventricular hypertrophy. Left ventricular  diastolic parameters are indeterminate.   Right Ventricle: The right ventricular size is normal. No increase in  right ventricular wall thickness. Right ventricular systolic function is  normal. There is normal pulmonary artery systolic pressure. The tricuspid  regurgitant velocity is 2.85 m/s, and   with an assumed right atrial pressure of 3 mmHg, the estimated right  ventricular systolic pressure is 35.5 mmHg.   Left Atrium: Left atrial size was  normal in size.   Right Atrium: Right atrial size was normal in size.   Pericardium: Trivial pericardial effusion is present. The pericardial  effusion is posterior to the left ventricle.   Mitral Valve: The mitral valve is grossly normal. Mild mitral valve  regurgitation. MV peak gradient, 2.8 mmHg. The mean mitral valve gradient  is 1.0 mmHg.   Tricuspid Valve: The tricuspid valve is grossly normal. Tricuspid valve  regurgitation is mild.   Aortic Valve: The aortic valve has an indeterminant number of cusps. There  is severe calcifcation of the aortic valve. There is mild aortic valve  annular calcification. Aortic valve regurgitation is mild to moderate.  Aortic regurgitation PHT measures 433   msec. Severe aortic stenosis is present. Aortic valve mean gradient  measures 39.5 mmHg. Aortic valve peak gradient measures 65.8 mmHg. Aortic  valve area, by VTI measures 0.84 cm.    Pulmonic Valve: The pulmonic valve was grossly normal. Pulmonic valve  regurgitation is trivial.   Aorta: Aortic dilatation noted. There is mild dilatation of the ascending  aorta, measuring 38 mm.   Venous: The inferior vena cava is normal in size with greater than 50%  respiratory variability, suggesting right atrial pressure of 3 mmHg.   IAS/Shunts: No atrial level shunt detected by color flow Doppler.   Additional Comments: 3D was performed not requiring image post processing  on an independent workstation and was indeterminate.     LEFT VENTRICLE  PLAX 2D  LVIDd:         5.00 cm     Diastology  LVIDs:         2.80 cm     LV e' medial:    4.79 cm/s  LV PW:         1.40 cm     LV E/e' medial:  16.6  LV IVS:        1.40 cm     LV e' lateral:   5.55 cm/s  LVOT diam:     2.10 cm     LV E/e' lateral: 14.3  LV SV:         78  LV SV Index:   32  LVOT Area:     3.46 cm    LV Volumes (MOD)  LV vol d, MOD A2C: 68.6 ml  LV vol d, MOD A4C: 65.4 ml  LV vol s, MOD A2C: 17.9 ml  LV vol s, MOD A4C: 29.5 ml  LV SV MOD A2C:     50.7 ml  LV SV MOD A4C:     65.4 ml  LV SV MOD BP:      43.2 ml   RIGHT VENTRICLE             IVC  RV Basal diam:  3.50 cm     IVC diam: 1.90 cm  RV Mid diam:    2.70 cm  RV S prime:     17.00 cm/s  TAPSE (M-mode): 3.2 cm   LEFT ATRIUM             Index        RIGHT ATRIUM           Index  LA diam:        4.20 cm 1.71 cm/m   RA Area:     13.20 cm  LA Vol (A2C):   59.8 ml 24.32 ml/m  RA Volume:   27.90 ml  11.35 ml/m  LA Vol (A4C):   66.2  ml 26.92 ml/m  LA Biplane Vol: 63.7 ml 25.90 ml/m   AORTIC VALVE                     PULMONIC VALVE  AV Area (Vmax):    0.87 cm      PV Vmax:       1.24 m/s  AV Area (Vmean):   0.86 cm      PV Peak grad:  6.1 mmHg  AV Area (VTI):     0.84 cm  AV Vmax:           405.50 cm/s  AV Vmean:          297.500 cm/s  AV VTI:            0.932 m  AV Peak Grad:      65.8 mmHg  AV Mean Grad:      39.5 mmHg  LVOT Vmax:          101.45 cm/s  LVOT Vmean:        73.750 cm/s  LVOT VTI:          0.226 m  LVOT/AV VTI ratio: 0.24  AI PHT:            433 msec  AR Vena Contracta: 0.60 cm    AORTA  Ao Root diam: 3.10 cm  Ao Asc diam:  3.80 cm   MITRAL VALVE               TRICUSPID VALVE  MV Area (PHT): 3.74 cm    TR Peak grad:   32.5 mmHg  MV Area VTI:   3.14 cm    TR Vmax:        285.00 cm/s  MV Peak grad:  2.8 mmHg  MV Mean grad:  1.0 mmHg    SHUNTS  MV Vmax:       0.84 m/s    Systemic VTI:  0.23 m  MV Vmean:      49.4 cm/s   Systemic Diam: 2.10 cm  MV Decel Time: 203 msec  MV E velocity: 79.30 cm/s  MV A velocity: 67.70 cm/s  MV E/A ratio:  1.17   Recent Labs: 01/02/2024: ALT 19 05/02/2024: BUN 15; Creatinine, Ser 0.89; Hemoglobin 14.7; Platelets 203; Potassium 3.7; Sodium 137   Lipid Panel    Component Value Date/Time   CHOL 190 01/02/2024 1120   TRIG 99 01/02/2024 1120   HDL 46 01/02/2024 1120   CHOLHDL 4.1 01/02/2024 1120   CHOLHDL 4.8 08/28/2019 1809   VLDL 19 08/28/2019 1809   LDLCALC 126 (H) 01/02/2024 1120     Wt Readings from Last 3 Encounters:  05/30/24 269 lb (122 kg)  05/02/24 267 lb (121.1 kg)  04/30/24 267 lb 6.4 oz (121.3 kg)     Assessment and Plan:   1. Severe Aortic Valve Stenosis: He has severe, stage D aortic valve stenosis. He has NYHA class 2 symptoms. I have personally reviewed the echo images. The aortic valve is thickened and calcified with limited leaflet mobility. I think he would benefit from AVR. He will most likely be treated with surgical AVR given his young age, excellent functional status and bicuspid aortic valve.  He would also be a candidate for TAVR. We reviewed options for surgical AVR including mechanical AVR and bioprosthetic AVR and TAVR. He is on coumadin  now so a mechanical valve would be an option.   I have reviewed the natural history of aortic stenosis  with the patient and their family members  who are present today. We have discussed the  limitations of medical therapy and the poor prognosis associated with symptomatic aortic stenosis. We have reviewed potential treatment options, including palliative medical therapy, conventional surgical aortic valve replacement, and transcatheter aortic valve replacement. We discussed treatment options in the context of the patient's specific comorbid medical conditions.   He would like to proceed with planning for AVR. I will arrange a cardiac cath (coronaries only) at [facility] 06/06/24 at noon. Risks and benefits of the cath and valve procedure are reviewed with the patient. After the cath, we will arrange cardiac CT as well as CTA of the chest/abd/pelvis. He will then be referred to CT surgery to discuss surgical AVR vs TAVR.      Labs/ tests ordered today include:   Orders Placed This Encounter  Procedures   CBC   Basic Metabolic Panel (BMET)   Protime-INR   EKG 12-Lead   Disposition:   F/U will be arranged with the structural team  Signed, [name], MD, [facility] 05/30/2024 9:25 AM    [facility] [address] [place], [place], [place]  [address] Phone: [phone]; Fax: [phone]

## 2024-05-30 NOTE — H&P (View-Only) (Signed)
 [facility] Consult Note  Chief Complaint  Johnathan Richmond presents with   New Johnathan Richmond (Initial Visit)    Aortic stenosis   History of Present Illness: 63 yo male with history of DM, HTN, obesity, paroxysmal atrial flutter and aortic stenosis who is here today as a new consult, referred by Dr. Mallipeddi, for further discussion regarding his aortic stenosis. He has paroxysmal atrial flutter diagnosed in 2018. He was found to have moderate aortic stenosis in 2020. He was lost to cardiology follow up from 2020 until 2025 and reported not taking Eliquis  due to cost. He was seen 04/30/24 by Dr. Stacia and c/o dyspnea and chest pain. He was started on coumadin . Echo 05/02/24 with LVEF=65-70%. Mild mitral regurgitation. Likely bicuspid aortic valve with mild to moderate regurgitation and severe aortic stenosis (mean gradient 39.5 mmHg, AVA 0.84 cm2, SVI 27, DI 0.24).   He tells me today that he has progressive dyspnea with exertion and chest pain. He has some dizziness. No near syncope. He has fatigue. He lives in [place], [place]. He lives alone. 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 He has no active dental issues.   Primary Care Physician: Grooms, [place], [place] Primary Cardiologist: Mallipeddi Referring Cardiologist: Mallipeddi  Past Medical History:  Diagnosis Date   Aortic stenosis    Diabetes mellitus (HCC)    Essential hypertension    Hypothyroidism    Morbid obesity (HCC)    Paroxysmal atrial flutter (HCC)     Past Surgical History:  Procedure Laterality Date   CHOLECYSTECTOMY      Current Outpatient Medications  Medication Sig Dispense Refill   metFORMIN  (GLUCOPHAGE -XR) 750 MG 24 hr tablet Take 1 tablet (750 mg total) by mouth daily with breakfast. 90 tablet 0   warfarin (COUMADIN ) 3 MG tablet Take 1 tablet (3 mg total) by mouth daily. 30 tablet 5   diltiazem  (CARDIZEM  CD) 120 MG 24 hr capsule TAKE 1 CAPSULE(120 MG) BY MOUTH DAILY (Johnathan Richmond not  taking: Reported on 05/30/2024) 90 capsule 0   insulin  glargine (LANTUS  SOLOSTAR) 100 UNIT/ML Solostar Pen Inject 20 Units into the skin daily. (Johnathan Richmond not taking: Reported on 05/30/2024) 15 mL 1   Insulin  Pen Needle (PEN NEEDLES) 31G X 5 MM MISC 1 each by Does not apply route daily. May substitute to any manufacturer covered by Johnathan Richmond's insurance. (Johnathan Richmond not taking: Reported on 05/30/2024) 100 each 0   No current facility-administered medications for this visit.    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  Tobacco Use   Smoking status: Never   Smokeless tobacco: Never  Vaping Use   Vaping status: Never Used  Substance and Sexual Activity   Alcohol use: No   Drug use: No   Sexual activity: Not on file  Other Topics Concern   Not on file  Social History Narrative   Not on file   Social Drivers of Health   Financial Resource Strain: Not on file  Food Insecurity: Not on file  Transportation Needs: Not on file  Physical Activity: Not on file  Stress: Not on file  Social Connections: Not on file  Intimate Partner Violence: Not on file    Family History  Problem Relation Age of Onset   Schizophrenia Mother    COPD Mother    Epilepsy Father    Schizophrenia Brother     Review of Systems:  As stated in the HPI and otherwise negative.   BP (!) 144/78 (BP Location: Left Arm, Johnathan Richmond Position: Sitting)   Pulse 70   Ht 6' 2 (1.88 m)   Wt 269 lb (122 kg)   SpO2 97%   BMI 34.54 kg/m   Physical Examination: General: Well developed, well nourished, NAD  HEENT: OP clear, mucus membranes moist  SKIN: warm, dry. No rashes. Neuro: No focal deficits  Musculoskeletal: Muscle strength 5/5 all ext  Psychiatric: Mood and affect  normal  Neck: No JVD, no carotid bruits, no thyromegaly, no lymphadenopathy.  Lungs:Clear bilaterally, no wheezes, rhonci, crackles Cardiovascular: Regular rate and rhythm. Loud, harsh, late peaking systolic murmur.  Abdomen:Soft. Bowel sounds present. Non-tender.  Extremities:  No lower extremity edema. Pulses are 2 + in the bilateral DP/PT.  EKG:  EKG is ordered today. The ekg ordered today demonstrates  EKG Interpretation Date/Time:  Thursday May 30 2024 08:47:24 EDT Ventricular Rate:  68 PR Interval:  170 QRS Duration:  92 QT Interval:  372 QTC Calculation: 395 R Axis:   -35  Text Interpretation: Normal sinus rhythm Incomplete right bundle branch block Non-specific T wave abnormality Confirmed by Verlin Bruckner [phone]) on 05/30/2024 9:25:14 AM   Echo 05/02/24:  1. Left ventricular ejection fraction, by estimation, is 65 to 70%. The  left ventricle has normal function. The left ventricle has no regional  wall motion abnormalities. There is moderate concentric left ventricular  hypertrophy. Left ventricular  diastolic parameters are indeterminate.   2. Right ventricular systolic function is normal. The right ventricular  size is normal. There is normal pulmonary artery systolic pressure.   3. The mitral valve is grossly normal. Mild mitral valve regurgitation.   4. The aortic valve has an indeterminant number of cusps, possibly  bicuspid. There is severe calcifcation of the aortic valve. Aortic valve  regurgitation is mild to moderate. Severe aortic valve stenosis. Aortic  valve mean gradient measures 39.5 mmHg.  Aortic valve Vmax measures 4.06 m/s. Dimentionless index 0.24.   5. Aortic dilatation noted. There is mild dilatation of the ascending  aorta, measuring 38 mm.   6. The inferior vena cava is normal in size with greater than 50%  respiratory variability, suggesting right atrial pressure of 3 mmHg.   Comparison(s): A prior study was performed on 08/29/2019.  Prior images  unable to be directly viewed. Aortic valve structure not well defined,  possibly bicuspid with evidence of severe stenosis. Mean AV gradient 39.5  mmHg and DI 0.24. Mild to moderate  aortic regurgitation.   FINDINGS   Left Ventricle: Left ventricular ejection fraction, by estimation, is 65  to 70%. The left ventricle has normal function. The left ventricle has no  regional wall motion abnormalities. The left ventricular internal cavity  size was normal in size. There is   moderate concentric left ventricular hypertrophy. Left ventricular  diastolic parameters are indeterminate.   Right Ventricle: The right ventricular size is normal. No increase in  right ventricular wall thickness. Right ventricular systolic function is  normal. There is normal pulmonary artery systolic pressure. The tricuspid  regurgitant velocity is 2.85 m/s, and   with an assumed right atrial pressure of 3 mmHg, the estimated right  ventricular systolic pressure is 35.5 mmHg.   Left Atrium: Left atrial size was  normal in size.   Right Atrium: Right atrial size was normal in size.   Pericardium: Trivial pericardial effusion is present. The pericardial  effusion is posterior to the left ventricle.   Mitral Valve: The mitral valve is grossly normal. Mild mitral valve  regurgitation. MV peak gradient, 2.8 mmHg. The mean mitral valve gradient  is 1.0 mmHg.   Tricuspid Valve: The tricuspid valve is grossly normal. Tricuspid valve  regurgitation is mild.   Aortic Valve: The aortic valve has an indeterminant number of cusps. There  is severe calcifcation of the aortic valve. There is mild aortic valve  annular calcification. Aortic valve regurgitation is mild to moderate.  Aortic regurgitation PHT measures 433   msec. Severe aortic stenosis is present. Aortic valve mean gradient  measures 39.5 mmHg. Aortic valve peak gradient measures 65.8 mmHg. Aortic  valve area, by VTI measures 0.84 cm.    Pulmonic Valve: The pulmonic valve was grossly normal. Pulmonic valve  regurgitation is trivial.   Aorta: Aortic dilatation noted. There is mild dilatation of the ascending  aorta, measuring 38 mm.   Venous: The inferior vena cava is normal in size with greater than 50%  respiratory variability, suggesting right atrial pressure of 3 mmHg.   IAS/Shunts: No atrial level shunt detected by color flow Doppler.   Additional Comments: 3D was performed not requiring image post processing  on an independent workstation and was indeterminate.     LEFT VENTRICLE  PLAX 2D  LVIDd:         5.00 cm     Diastology  LVIDs:         2.80 cm     LV e' medial:    4.79 cm/s  LV PW:         1.40 cm     LV E/e' medial:  16.6  LV IVS:        1.40 cm     LV e' lateral:   5.55 cm/s  LVOT diam:     2.10 cm     LV E/e' lateral: 14.3  LV SV:         78  LV SV Index:   32  LVOT Area:     3.46 cm    LV Volumes (MOD)  LV vol d, MOD A2C: 68.6 ml  LV vol d, MOD A4C: 65.4 ml  LV vol s, MOD A2C: 17.9 ml  LV vol s, MOD A4C: 29.5 ml  LV SV MOD A2C:     50.7 ml  LV SV MOD A4C:     65.4 ml  LV SV MOD BP:      43.2 ml   RIGHT VENTRICLE             IVC  RV Basal diam:  3.50 cm     IVC diam: 1.90 cm  RV Mid diam:    2.70 cm  RV S prime:     17.00 cm/s  TAPSE (M-mode): 3.2 cm   LEFT ATRIUM             Index        RIGHT ATRIUM           Index  LA diam:        4.20 cm 1.71 cm/m   RA Area:     13.20 cm  LA Vol (A2C):   59.8 ml 24.32 ml/m  RA Volume:   27.90 ml  11.35 ml/m  LA Vol (A4C):   66.2  ml 26.92 ml/m  LA Biplane Vol: 63.7 ml 25.90 ml/m   AORTIC VALVE                     PULMONIC VALVE  AV Area (Vmax):    0.87 cm      PV Vmax:       1.24 m/s  AV Area (Vmean):   0.86 cm      PV Peak grad:  6.1 mmHg  AV Area (VTI):     0.84 cm  AV Vmax:           405.50 cm/s  AV Vmean:          297.500 cm/s  AV VTI:            0.932 m  AV Peak Grad:      65.8 mmHg  AV Mean Grad:      39.5 mmHg  LVOT Vmax:          101.45 cm/s  LVOT Vmean:        73.750 cm/s  LVOT VTI:          0.226 m  LVOT/AV VTI ratio: 0.24  AI PHT:            433 msec  AR Vena Contracta: 0.60 cm    AORTA  Ao Root diam: 3.10 cm  Ao Asc diam:  3.80 cm   MITRAL VALVE               TRICUSPID VALVE  MV Area (PHT): 3.74 cm    TR Peak grad:   32.5 mmHg  MV Area VTI:   3.14 cm    TR Vmax:        285.00 cm/s  MV Peak grad:  2.8 mmHg  MV Mean grad:  1.0 mmHg    SHUNTS  MV Vmax:       0.84 m/s    Systemic VTI:  0.23 m  MV Vmean:      49.4 cm/s   Systemic Diam: 2.10 cm  MV Decel Time: 203 msec  MV E velocity: 79.30 cm/s  MV A velocity: 67.70 cm/s  MV E/A ratio:  1.17   Recent Labs: 01/02/2024: ALT 19 05/02/2024: BUN 15; Creatinine, Ser 0.89; Hemoglobin 14.7; Platelets 203; Potassium 3.7; Sodium 137   Lipid Panel    Component Value Date/Time   CHOL 190 01/02/2024 1120   TRIG 99 01/02/2024 1120   HDL 46 01/02/2024 1120   CHOLHDL 4.1 01/02/2024 1120   CHOLHDL 4.8 08/28/2019 1809   VLDL 19 08/28/2019 1809   LDLCALC 126 (H) 01/02/2024 1120     Wt Readings from Last 3 Encounters:  05/30/24 269 lb (122 kg)  05/02/24 267 lb (121.1 kg)  04/30/24 267 lb 6.4 oz (121.3 kg)     Assessment and Plan:   1. Severe Aortic Valve Stenosis: He has severe, stage D aortic valve stenosis. He has NYHA class 2 symptoms. I have personally reviewed the echo images. The aortic valve is thickened and calcified with limited leaflet mobility. I think he would benefit from AVR. He will most likely be treated with surgical AVR given his young age, excellent functional status and bicuspid aortic valve.  He would also be a candidate for TAVR. We reviewed options for surgical AVR including mechanical AVR and bioprosthetic AVR and TAVR. He is on coumadin  now so a mechanical valve would be an option.   I have reviewed the natural history of aortic stenosis  with the Johnathan Richmond and their family members  who are present today. We have discussed the  limitations of medical therapy and the poor prognosis associated with symptomatic aortic stenosis. We have reviewed potential treatment options, including palliative medical therapy, conventional surgical aortic valve replacement, and transcatheter aortic valve replacement. We discussed treatment options in the context of the Johnathan Richmond's specific comorbid medical conditions.   He would like to proceed with planning for AVR. I will arrange a cardiac cath (coronaries only) at [facility] 06/06/24 at noon. Risks and benefits of the cath and valve procedure are reviewed with the Johnathan Richmond. After the cath, we will arrange cardiac CT as well as CTA of the chest/abd/pelvis. He will then be referred to CT surgery to discuss surgical AVR vs TAVR.      Labs/ tests ordered today include:   Orders Placed This Encounter  Procedures   CBC   Basic Metabolic Panel (BMET)   Protime-INR   EKG 12-Lead   Disposition:   F/U will be arranged with the structural team  Signed, [name], MD, [facility] 05/30/2024 9:25 AM    [facility] [address] [place], [place], [place]  [address] Phone: [phone]; Fax: [phone]

## 2024-05-30 NOTE — Patient Instructions (Signed)
 Medication Instructions:  Your physician recommends that you continue on your current medications as directed. Please refer to the Current Medication list given to you today.  *If you need a refill on your cardiac medications before your next appointment, please call your pharmacy*  Lab Work: Lab work to be done today--BMP,CBC, PT/INR If you have labs (blood work) drawn today and your tests are completely normal, you will receive your results only by: MyChart Message (if you have MyChart) OR A paper copy in the mail If you have any lab test that is abnormal or we need to change your treatment, we will call you to review the results.  Testing/Procedures: Your physician has requested that you have a cardiac catheterization. Cardiac catheterization is used to diagnose and/or treat various heart conditions. Doctors may recommend this procedure for a number of different reasons. The most common reason is to evaluate chest pain. Chest pain can be a symptom of coronary artery disease (CAD), and cardiac catheterization can show whether plaque is narrowing or blocking your heart's arteries. This procedure is also used to evaluate the valves, as well as measure the blood flow and oxygen levels in different parts of your heart. For further information please visit [email or web address] Please follow instruction sheet, as given. Scheduled for October 2  Follow-Up: At [facility], you and your health needs are our priority.  As part of our continuing mission to provide you with exceptional heart care, our providers are all part of one team.  This team includes your primary Cardiologist (physician) and Advanced Practice Providers or APPs (Physician Assistants and Nurse Practitioners) who all work together to provide you with the care you need, when you need it.  Your next appointment:   To be arranged after procedure  Provider:   [name], MD    We recommend signing up for the patient  portal called MyChart.  Sign up information is provided on this After Visit Summary.  MyChart is used to connect with patients for Virtual Visits (Telemedicine).  Patients are able to view lab/test results, encounter notes, upcoming appointments, etc.  Non-urgent messages can be sent to your provider as well.   To learn more about what you can do with MyChart, go to [email or web address].   Other Instructions  [facility] A DEPT OF [place]. [facility] AT [address] A DEPT OF THE [place]. [facility] [address] [place] [address] Dept: [phone] Loc: [phone]  Johnathan Richmond  05/30/2024  You are scheduled for a Cardiac Catheterization on Thursday, October 2 with Dr. [name].  1. Please arrive at the [facility] (Main Entrance A) at [facility]: [address] [place], [place] [address] at 10:00 AM (This time is 2 hour(s) before your procedure to ensure your preparation).   Free valet parking service is available. You will check in at ADMITTING. The support person will be asked to wait in the waiting room.  It is OK to have someone drop you off and come back when you are ready to be discharged.    Special note: Every effort is made to have your procedure done on time. Please understand that emergencies sometimes delay scheduled procedures.  2. Diet: Nothing to eat after midnight.   3. Hydration: You need to be well hydrated before your procedure. On September 26, you may drink approved liquids (see below) until 2 hours before the procedure, with 16 oz of water as your last  intake.   List of approved liquids water, clear juice, clear tea, black coffee, fruit juices, non-citric and without pulp, carbonated beverages, Gatorade, Kool -Aid, plain Jello-O and plain ice popsicles.  4. Labs: done on 9/25.  5. Medication instructions in preparation for your procedure: Do not take metformin  or any insulin  the morning of the  procedure\ Do not take metformin  for 48 hours after procedure   Contrast Allergy: No   Stop taking Coumadin  (Warfarin) on Friday, September 26.--Last dose prior to procedure will be September 26   On the morning of your procedure, take your Aspirin  81 mg and any morning medicines NOT listed above.  You may use sips of water.  6. Plan to go home the same day, you will only stay overnight if medically necessary. 7. Bring a current list of your medications and current insurance cards. 8. You MUST have a responsible person to drive you home. 9. Someone MUST be with you the first 24 hours after you arrive home or your discharge will be delayed. 10. Please wear clothes that are easy to get on and off and wear slip-on shoes.  Thank you for allowing us  to care for you!   -- [facility]

## 2024-05-30 NOTE — Telephone Encounter (Signed)
 Johnathan Richmond

## 2024-05-30 NOTE — Telephone Encounter (Signed)
 H&V Care Navigation CSW Progress Note  Clinical Social Worker notes copay cards on file for pt for his lantus  and eliquis . No longer have active eliquis  script. Will see if we can have prior auth team process it with [facility].  Patient is participating in a Managed Medicaid Plan:  No, [employer]  SDOH Screenings   Depression (PHQ2-9): Low Risk  (04/02/2024)  Tobacco Use: Low Risk  (05/29/2024)     Marit Lark, MSW, LCSW Clinical Social Worker II [facility] Heart/Vascular Care Navigation  [phone]- work cell phone (preferred)

## 2024-05-30 NOTE — Telephone Encounter (Signed)
 H&V Care Navigation CSW Progress Note  Clinical Social Worker contacted patient by phone to f/u on concerns with medication affordability/insurance issues. Pt currently has [employer], is having issues using plan at the [employer]. Recommended [facility] use [facility] and copay card. Pt also eligible to enroll in Medicare, will discuss how he can use SHIIP team to complete that. Reached pt today, he states he is watching a video and needs to turn it off. Encouraged him to call me back when the video is done.   No return call from [phone]. Will re-attempt again tomorrow.   Patient is participating in a Managed Medicaid Plan:  No, [employer] only  SDOH Screenings   Depression (PHQ2-9): Low Risk  (04/02/2024)  Tobacco Use: Low Risk  (05/29/2024)    Marit Lark, MSW, LCSW Clinical Social Worker II [facility] Heart/Vascular Care Navigation  [phone]- work cell phone (preferred)

## 2024-05-30 NOTE — Progress Notes (Signed)
 Pre Surgical Assessment: 5 M Walk Test  66M=16.69ft  5 Meter Walk Test- trial 1: 6.43 seconds 5 Meter Walk Test- trial 2: 4.64 seconds 5 Meter Walk Test- trial 3: 5.26 seconds 5 Meter Walk Test Average: 5.44 seconds  m

## 2024-05-31 ENCOUNTER — Telehealth: Payer: Self-pay | Admitting: Licensed Clinical Social Worker

## 2024-05-31 ENCOUNTER — Ambulatory Visit: Payer: Self-pay | Admitting: *Deleted

## 2024-05-31 LAB — BASIC METABOLIC PANEL WITH GFR
BUN/Creatinine Ratio: 11 (ref 10–24)
BUN: 10 mg/dL (ref 8–27)
CO2: 21 mmol/L (ref 20–29)
Calcium: 8.6 mg/dL (ref 8.6–10.2)
Chloride: 105 mmol/L (ref 96–106)
Creatinine, Ser: 0.9 mg/dL (ref 0.76–1.27)
Glucose: 137 mg/dL — ABNORMAL HIGH (ref 70–99)
Potassium: 4.3 mmol/L (ref 3.5–5.2)
Sodium: 141 mmol/L (ref 134–144)
eGFR: 95 mL/min/1.73 (ref >59)

## 2024-05-31 LAB — CBC
Hematocrit: 44.6 % (ref 37.5–51.0)
Hemoglobin: 15 g/dL (ref 13.0–17.7)
MCH: 28.5 pg (ref 26.6–33.0)
MCHC: 33.6 g/dL (ref 31.5–35.7)
MCV: 85 fL (ref 79–97)
Platelets: 206 x10E3/uL (ref 150–450)
RBC: 5.27 x10E6/uL (ref 4.14–5.80)
RDW: 13.6 % (ref 11.6–15.4)
WBC: 4.6 x10E3/uL (ref 3.4–10.8)

## 2024-05-31 LAB — PROTIME-INR
INR: 1.2 (ref 0.9–1.2)
Prothrombin Time: 12.7 s — AB (ref 9.1–12.0)

## 2024-05-31 NOTE — Progress Notes (Signed)
 Heart and Vascular Care Navigation  05/31/2024  Johnathan Richmond 21-Dec-1960 [id number]  Reason for Referral: medication cost concerns, Medicare enrollment   Engaged with patient by telephone for initial visit for Heart and Vascular Care Coordination.                                                                                                   Assessment:  LCSW was able to speak with pt this afternoon. Introduced self, role, reason for call. Confirmed DOB and home address. Pt resides alone, works full time at [employer] center. He shares no issues with asked with transportation, rent, utilities, or food. He does share that his medications have given him a hard time to fill. He wasn't aware he had benefits on file for insurance. He had looked at the marketplace after he opted out of enrolling in [employer] benefits for the year, wasn't aware he enrolled. Pt shares that he is hoping to retire in the next year or so, wants to make sure he has what he needs for coverage.   We discussed that pt is eligible now to enroll in Medicare. Advised him to speak with [facility] team regarding appropriate plans. Encouraged him to make note of medications and providers he sees. Cautioned even with Medicare he may have to meet a deductible to make eliquis  more affordable each month. He states understanding, interested in information                                      HRT/VAS Care Coordination     Patients Home Cardiology Office [facility]   Outpatient Care Team Social Worker   Social Worker Name: Marit Lark, [place], [phone]   Living arrangements for the past 2 months Apartment   Lives with: Self   Patient Current Insurance Coverage Commercial Insurance   Patient Has Concern With Paying Medical Bills No   Does Patient Have Prescription Coverage? Yes   Home Assistive Devices/Equipment None       Social History:                                                                              SDOH Screenings   Food Insecurity: No Food Insecurity (05/31/2024)  Housing: Low Risk  (05/31/2024)  Transportation Needs: No Transportation Needs (05/31/2024)  Utilities: Not At Risk (05/31/2024)  Depression (PHQ2-9): Low Risk  (04/02/2024)  Financial Resource Strain: Medium Risk (05/31/2024)  Tobacco Use: Low Risk  (05/29/2024)  Health Literacy: Adequate Health Literacy (05/31/2024)    SDOH Interventions: Financial Resources:  Financial Strain Interventions: Other (Comment) (most concerns around medications and medical bills)- discussed SHIIP assistance to enroll in appropriate medicare plan   Food Insecurity:  Food Insecurity Interventions: Intervention Not Indicated  Housing Insecurity:  Housing Interventions: Intervention Not Indicated  Transportation:   Transportation Interventions: Intervention Not Indicated   Other Care Navigation Interventions:     Provided Pharmacy assistance resources  discussed SHIIP assistance to enroll in appropriate medicare plan   Follow-up plan:   LCSW will send my chart message to pt with further [facility] instructions and information on how to enroll in Medicare. Will also provide [employer] number for any questions. LCSW will make sure pt has this information and understands how to use. At his request I will also mail to home address. I have relayed [employer] coverage to financial counselor Rojelio to rebill accounts that may have been mis-billed as self pay.

## 2024-05-31 NOTE — Telephone Encounter (Signed)
 H&V Care Navigation CSW Progress Note  Clinical Social Worker contacted patient by phone to f/u on referral for SDOH concerns related to medication and medical bills. No answer this morning at [phone], left voicemail. Will re-attempt again as able.  Patient is participating in a Managed Medicaid Plan:  No, self pay only  SDOH Screenings   Depression (PHQ2-9): Low Risk  (04/02/2024)  Tobacco Use: Low Risk  (05/29/2024)    Marit Lark, MSW, LCSW Clinical Social Worker II [facility] Heart/Vascular Care Navigation  [phone]- work cell phone (preferred)

## 2024-06-04 ENCOUNTER — Telehealth: Payer: Self-pay | Admitting: *Deleted

## 2024-06-04 NOTE — Telephone Encounter (Signed)
 Cardiac Catheterization scheduled at [facility] for: Thursday June 06, 2024 12 Noon Arrival time [facility] Main Entrance A at: 10 AM  Diet: -May have light meal until 6 AM. (6 hours before procedure time) Approved light meal consists of plain toast, fruit, light soups, crackers.  Hydration: -May drink clear liquids until 2 hours before the procedure.  Approved liquids: Water, clear tea, black coffee, fruit juices-non-citric and without pulp,Gatorade, plain Jello/popsicles.   -Please drink 16 oz of water 2 hours before procedure.  Medication instructions: -Hold:  Warfarin-pt tells me he has not had any since 06/01/24 and knows to hold until post procedure  Metformin -day of procedure and 48 hours after procedure  Insulin -1/2 usual dose HS prior to procedure-pt tells me he does not take AM Insulin  -Other usual morning medications can be taken including aspirin  81 mg.  Plan to go home the same day, you will only stay overnight if medically necessary.  You must have responsible adult to drive you home.  Someone must be with you the first 24 hours after you arrive home.  Reviewed procedure instructions with patient.

## 2024-06-06 ENCOUNTER — Other Ambulatory Visit: Payer: Self-pay

## 2024-06-06 ENCOUNTER — Encounter (HOSPITAL_COMMUNITY)
Admission: RE | Disposition: A | Discharge status: Home / Self Care | Payer: Self-pay | Source: Home / Self Care | Attending: Cardiovascular Disease

## 2024-06-06 ENCOUNTER — Ambulatory Visit (HOSPITAL_COMMUNITY)
Admission: RE | Admit: 2024-06-06 | Discharge: 2024-06-06 | Disposition: A | Discharge status: Home / Self Care | Payer: Self-pay | Attending: Cardiovascular Disease | Admitting: Cardiovascular Disease

## 2024-06-06 DIAGNOSIS — I350 Nonrheumatic aortic (valve) stenosis: Secondary | ICD-10-CM | POA: Diagnosis present

## 2024-06-06 DIAGNOSIS — Z7901 Long term (current) use of anticoagulants: Secondary | ICD-10-CM | POA: Insufficient documentation

## 2024-06-06 DIAGNOSIS — I4892 Unspecified atrial flutter: Secondary | ICD-10-CM | POA: Insufficient documentation

## 2024-06-06 DIAGNOSIS — Z5181 Encounter for therapeutic drug level monitoring: Secondary | ICD-10-CM

## 2024-06-06 HISTORY — PX: CORONARY ANGIOGRAPHY: CATH118303

## 2024-06-06 LAB — GLUCOSE, CAPILLARY: Glucose-Capillary: 122 mg/dL — ABNORMAL HIGH (ref 70–99)

## 2024-06-06 LAB — PROTIME-INR
INR: 1 (ref 0.8–1.2)
Prothrombin Time: 13.7 s (ref 11.4–15.2)

## 2024-06-06 SURGERY — CORONARY ANGIOGRAPHY (CATH LAB)
Anesthesia: LOCAL

## 2024-06-06 MED ORDER — FENTANYL CITRATE (PF) 100 MCG/2ML IJ SOLN
INTRAMUSCULAR | Status: DC | PRN
  Administered 2024-06-06: 50 ug via INTRAVENOUS

## 2024-06-06 MED ORDER — ACETAMINOPHEN 325 MG PO TABS: 650.0000 mg | ORAL_TABLET | ORAL | Status: DC | PRN

## 2024-06-06 MED ORDER — LABETALOL HCL 5 MG/ML IV SOLN: 10.0000 mg | INTRAVENOUS | Status: AC | PRN

## 2024-06-06 MED ORDER — LIDOCAINE HCL (PF) 1 % IJ SOLN
INTRAMUSCULAR | Status: DC | PRN
  Administered 2024-06-06: 2 mL

## 2024-06-06 MED ORDER — FREE WATER: 500.0000 mL | Freq: Once | Status: DC

## 2024-06-06 MED ORDER — SODIUM CHLORIDE 0.9 % IV SOLN: 250.0000 mL | INTRAVENOUS | Status: DC | PRN

## 2024-06-06 MED ORDER — LIDOCAINE HCL (PF) 1 % IJ SOLN
INTRAMUSCULAR | Status: AC
  Filled 2024-06-06: qty 30

## 2024-06-06 MED ORDER — VERAPAMIL HCL 2.5 MG/ML IV SOLN
INTRAVENOUS | Status: AC
  Filled 2024-06-06: qty 2

## 2024-06-06 MED ORDER — MIDAZOLAM HCL 2 MG/2ML IJ SOLN
INTRAMUSCULAR | Status: AC
  Filled 2024-06-06: qty 2

## 2024-06-06 MED ORDER — ONDANSETRON HCL 4 MG/2ML IJ SOLN: 4.0000 mg | Freq: Four times a day (QID) | INTRAMUSCULAR | Status: DC | PRN

## 2024-06-06 MED ORDER — SODIUM CHLORIDE 0.9% FLUSH: 3.0000 mL | Freq: Two times a day (BID) | INTRAVENOUS | Status: DC

## 2024-06-06 MED ORDER — HEPARIN SODIUM (PORCINE) 1000 UNIT/ML IJ SOLN
INTRAMUSCULAR | Status: AC
  Filled 2024-06-06: qty 10

## 2024-06-06 MED ORDER — VERAPAMIL HCL 2.5 MG/ML IV SOLN
INTRAVENOUS | Status: DC | PRN
  Administered 2024-06-06: 10 mL via INTRA_ARTERIAL

## 2024-06-06 MED ORDER — ASPIRIN 81 MG PO CHEW
81.0000 mg | CHEWABLE_TABLET | ORAL | Status: AC
  Administered 2024-06-06: 81 mg via ORAL
  Filled 2024-06-06: qty 1

## 2024-06-06 MED ORDER — FENTANYL CITRATE (PF) 100 MCG/2ML IJ SOLN
INTRAMUSCULAR | Status: AC
  Filled 2024-06-06: qty 2

## 2024-06-06 MED ORDER — HEPARIN SODIUM (PORCINE) 1000 UNIT/ML IJ SOLN
INTRAMUSCULAR | Status: DC | PRN
  Administered 2024-06-06: 6000 [IU] via INTRAVENOUS

## 2024-06-06 MED ORDER — SODIUM CHLORIDE 0.9% FLUSH: 3.0000 mL | INTRAVENOUS | Status: DC | PRN

## 2024-06-06 MED ORDER — HYDRALAZINE HCL 20 MG/ML IJ SOLN: 10.0000 mg | INTRAMUSCULAR | Status: AC | PRN

## 2024-06-06 MED ORDER — MIDAZOLAM HCL 2 MG/2ML IJ SOLN
INTRAMUSCULAR | Status: DC | PRN
  Administered 2024-06-06: 2 mg via INTRAVENOUS

## 2024-06-06 MED ORDER — HEPARIN (PORCINE) IN NACL 1000-0.9 UT/500ML-% IV SOLN
INTRAVENOUS | Status: DC | PRN
  Administered 2024-06-06: 1000 mL via SURGICAL_CAVITY

## 2024-06-06 SURGICAL SUPPLY — 9 items
CATH 5FR JL3.5 JR4 ANG PIG MP (CATHETERS) IMPLANT
CATH INFINITI 5 FR 3DRC (CATHETERS) IMPLANT
CATH INFINITI 5 FR AR1 MOD (CATHETERS) IMPLANT
DEVICE RAD COMP TR BAND LRG (VASCULAR PRODUCTS) IMPLANT
GLIDESHEATH SLEND SS 6F .021 (SHEATH) IMPLANT
GUIDEWIRE INQWIRE 1.5J.035X260 (WIRE) IMPLANT
PACK CARDIAC CATHETERIZATION (CUSTOM PROCEDURE TRAY) ×2 IMPLANT
SET ATX-X65L (MISCELLANEOUS) IMPLANT
SHEATH PROBE COVER 6X72 (BAG) IMPLANT

## 2024-06-06 NOTE — Interval H&P Note (Signed)
 History and Physical Interval Note:  06/06/2024 10:55 AM  Johnathan Richmond  has presented today for surgery, with the diagnosis of aortic stenosis.  The various methods of treatment have been discussed with the patient and family. After consideration of risks, benefits and other options for treatment, the patient has consented to  Procedure(s): CORONARY ANGIOGRAPHY (N/A) as a surgical intervention.  The patient's history has been reviewed, patient examined, no change in status, stable for surgery.  I have reviewed the patient's chart and labs.  Questions were answered to the patient's satisfaction.    Cath Lab Visit (complete for each Cath Lab visit)  Clinical Evaluation Leading to the Procedure:   ACS: No.  Non-ACS:    Anginal Classification: CCS II  Anti-ischemic medical therapy: No Therapy  Non-Invasive Test Results: No non-invasive testing performed  Prior CABG: No previous CABG        [name]

## 2024-06-06 NOTE — Discharge Instructions (Addendum)
 Resume coumadin  tonight.   Patient should remain out of work for the next 4 days.   NO METFORMIN  FOR 2 DAYS

## 2024-06-06 NOTE — Progress Notes (Signed)
 Patient and friend Johnathan Richmond given discharge instructions, they both state they do not have any further questions at this time. Patient able to ambulate and void, no bleeding or hematoma noted to site. Educated in regards to restarting metformin  a full 48 hours from catheterization.

## 2024-06-07 ENCOUNTER — Encounter (HOSPITAL_COMMUNITY): Payer: Self-pay | Admitting: Cardiovascular Disease

## 2024-06-11 ENCOUNTER — Telehealth: Payer: Self-pay | Admitting: Licensed Clinical Social Worker

## 2024-06-11 NOTE — Telephone Encounter (Addendum)
 H&V Care Navigation CSW Progress Note  Clinical Social Worker contacted patient by phone to f/u on resources sent. Pt reached at [phone]. He confirmed resources received and he has enrolled in Medicare which will start on 11/1. Encouraged him to bring that information to appts beginning 11/1. Pt has no additional questions- just had question about time/date/location of upcoming appt. Provided him that information and address here at [facility]. Encouraged him to call me as needed moving forward.  Patient is participating in a Managed Medicaid Plan:  No, [employer] commercial plan currently  SDOH Screenings   Food Insecurity: No Food Insecurity (05/31/2024)  Housing: Low Risk  (05/31/2024)  Transportation Needs: No Transportation Needs (05/31/2024)  Utilities: Not At Risk (05/31/2024)  Depression (PHQ2-9): Low Risk  (04/02/2024)  Financial Resource Strain: Medium Risk (05/31/2024)  Tobacco Use: Low Risk  (05/29/2024)  Health Literacy: Adequate Health Literacy (05/31/2024)     Marit Lark, MSW, LCSW Clinical Social Worker II [facility] Heart/Vascular Care Navigation  [phone]- work cell phone (preferred)

## 2024-06-12 ENCOUNTER — Ambulatory Visit (HOSPITAL_COMMUNITY)
Admission: RE | Admit: 2024-06-12 | Discharge: 2024-06-12 | Disposition: A | Discharge status: Home / Self Care | Source: Ambulatory Visit | Attending: Physician Assistant | Admitting: Physician Assistant

## 2024-06-12 DIAGNOSIS — I350 Nonrheumatic aortic (valve) stenosis: Secondary | ICD-10-CM | POA: Diagnosis present

## 2024-06-12 MED ORDER — IOHEXOL 350 MG/ML SOLN
100.0000 mL | Freq: Once | INTRAVENOUS | Status: AC | PRN
  Administered 2024-06-12: 100 mL via INTRAVENOUS

## 2024-06-12 NOTE — Progress Notes (Signed)
 Procedure Type: Isolated AVR Perioperative Outcome Estimate % Operative Mortality 1.1% Morbidity & Mortality 8.5% Stroke 1.01% Renal Failure 2.01% Reoperation 3.49% Prolonged Ventilation 4.22% Deep Sternal Wound Infection 0.113% Long Hospital Stay (>14 days) 3.31% Short Hospital Stay (<6 days)* 41.2%

## 2024-06-17 ENCOUNTER — Ambulatory Visit: Payer: Self-pay | Attending: Internal Medicine

## 2024-06-17 ENCOUNTER — Ambulatory Visit: Payer: Self-pay | Admitting: Cardiovascular Disease

## 2024-06-17 ENCOUNTER — Encounter: Payer: Self-pay | Admitting: *Deleted

## 2024-06-21 ENCOUNTER — Ambulatory Visit
Attending: Thoracic Surgery (Cardiothoracic Vascular Surgery) | Admitting: Thoracic Surgery (Cardiothoracic Vascular Surgery)

## 2024-06-21 ENCOUNTER — Other Ambulatory Visit: Payer: Self-pay

## 2024-06-21 ENCOUNTER — Telehealth: Payer: Self-pay

## 2024-06-21 VITALS — BP 150/83 | HR 80 | Resp 20 | Ht 74.0 in | Wt 267.0 lb

## 2024-06-21 DIAGNOSIS — I350 Nonrheumatic aortic (valve) stenosis: Secondary | ICD-10-CM

## 2024-06-21 DIAGNOSIS — Q2381 Bicuspid aortic valve: Secondary | ICD-10-CM

## 2024-06-21 DIAGNOSIS — I480 Paroxysmal atrial fibrillation: Secondary | ICD-10-CM

## 2024-06-21 NOTE — Progress Notes (Signed)
 [address]       [place] [address]             [phone]        ANSEL FERRALL [facility] Medical Record #[id number] Date of Birth: 02/03/1961  Referring: Verlin Lonni BIRCH* Primary Care: Grooms, [place], [place] Primary Cardiologist:Vishnu SHAUNNA Maywood, MD  Chief Complaint:    Chief Complaint  Patient presents with   Aortic Stenosis    Surgical consult    History of Present Illness:     HRISTOPHER MISSILDINE is a 63 y.o. male who presents for surgical evaluation of severe aortic stenosis.  He has a history of DM, and paroxysmal atrial flutter.  He has been symptomatic with shortness of breath.  He denies any chest pain, or presyncope.     Past Medical and Surgical History: Previous Chest Surgery: no Previous Chest Radiation: no Diabetes Mellitus: yes.  HbA1C 7.4 Creatinine:  Lab Results  Component Value Date   CREATININE 0.90 05/30/2024   CREATININE 0.89 05/02/2024   CREATININE 0.97 01/02/2024     Past Medical History:  Diagnosis Date   Aortic stenosis    Diabetes mellitus (HCC)    Essential hypertension    Hypothyroidism    Morbid obesity (HCC)    Paroxysmal atrial flutter (HCC)     Past Surgical History:  Procedure Laterality Date   CHOLECYSTECTOMY     CORONARY ANGIOGRAPHY N/A 06/06/2024   Procedure: CORONARY ANGIOGRAPHY;  Surgeon: Verlin Lonni BIRCH, MD;  Location: [facility] INVASIVE CV LAB;  Service: Cardiovascular;  Laterality: N/A;    Social History:  Social History   Tobacco Use  Smoking Status Never  Smokeless Tobacco Never    Social History   Substance and Sexual Activity  Alcohol Use No     No Known Allergies  Medications:  Anti-Coagulation: coumadin   Current Outpatient Medications  Medication Sig Dispense Refill   insulin  glargine (LANTUS  SOLOSTAR) 100 UNIT/ML Solostar Pen Inject 20 Units into the skin daily. 15 mL 1   Insulin  Pen Needle (PEN NEEDLES) 31G X 5 MM MISC 1 each by Does not apply route  daily. May substitute to any manufacturer covered by patient's insurance. 100 each 0   metFORMIN  (GLUCOPHAGE -XR) 750 MG 24 hr tablet Take 1 tablet (750 mg total) by mouth daily with breakfast. 90 tablet 0   warfarin (COUMADIN ) 3 MG tablet Take 1 tablet (3 mg total) by mouth daily. 30 tablet 5   No current facility-administered medications for this visit.    (Not in a hospital admission)   Family History  Problem Relation Age of Onset   Schizophrenia Mother    COPD Mother    Epilepsy Father    Schizophrenia Brother      Review of Systems:   Review of Systems  Constitutional:  Positive for malaise/fatigue.  Respiratory:  Positive for shortness of breath.   Cardiovascular:  Negative for chest pain.  Neurological:  Positive for dizziness.      Physical Exam: BP (!) 150/83   Pulse 80   Resp 20   Ht 6' 2 (1.88 m)   Wt 267 lb (121.1 kg)   SpO2 96% Comment: RA  BMI 34.28 kg/m  Physical Exam Constitutional:      General: He is not in acute distress.    Appearance: He is not ill-appearing.  HENT:     Head: Normocephalic and atraumatic.  Eyes:     Extraocular Movements: Extraocular movements intact.  Cardiovascular:     Rate and Rhythm: Normal rate.  Pulmonary:     Effort: Pulmonary effort is normal. No respiratory distress.  Abdominal:     General: Abdomen is flat. There is no distension.  Musculoskeletal:        General: Normal range of motion.     Cervical back: Normal range of motion.  Skin:    General: Skin is warm and dry.  Neurological:     General: No focal deficit present.     Mental Status: He is alert and oriented to person, place, and time.       Diagnostic Studies & Laboratory data: Cardiac Studies & Procedures   ______________________________________________________________________________________________ CARDIAC CATHETERIZATION  CARDIAC CATHETERIZATION 06/06/2024  Conclusion No angiographic evidence of CAD  Recommendations: Continue workup  for AVR.  Findings Coronary Findings Diagnostic  Dominance: Right  Left Anterior Descending Vessel is large.  Left Circumflex Vessel is large.  Right Coronary Artery Vessel is large.  Intervention  No interventions have been documented.     ECHOCARDIOGRAM  ECHOCARDIOGRAM COMPLETE 05/02/2024  Narrative ECHOCARDIOGRAM REPORT    Patient Name:   DAKOTAH ORREGO Date of Exam: 05/02/2024 Medical Rec #:  [id number]        Height:       74.0 in Accession #:    [id number]       Weight:       267.4 lb Date of Birth:  02-09-1961       BSA:          2.459 m Patient Age:    62 years         BP:           128/72 mmHg Patient Gender: M                HR:           71 bpm. Exam Location:  [place]  Procedure: 2D Echo, Cardiac Doppler and Color Doppler (Both Spectral and Color Flow Doppler were utilized during procedure).  Indications:    I35.0 Nonrheumatic aortic (valve) stenosis  History:        Patient has prior history of Echocardiogram examinations, most recent 08/29/2019. Aortic Valve Disease, Arrythmias:Atrial Fibrillation and Atrial Flutter, Signs/Symptoms:Chest Pain, Dyspnea and Dizziness/Lightheadedness; Risk Factors:Hypertension, Diabetes, Dyslipidemia, Morbid obesity and Non-Smoker.  Sonographer:    Bascom Burows RCS, RVS Referring Phys: [id number] VISHNU P MALLIPEDDI   Sonographer Comments: Global longitudinal strain was attempted. IMPRESSIONS   1. Left ventricular ejection fraction, by estimation, is 65 to 70%. The left ventricle has normal function. The left ventricle has no regional wall motion abnormalities. There is moderate concentric left ventricular hypertrophy. Left ventricular diastolic parameters are indeterminate. 2. Right ventricular systolic function is normal. The right ventricular size is normal. There is normal pulmonary artery systolic pressure. 3. The mitral valve is grossly normal. Mild mitral valve regurgitation. 4. The aortic valve has an  indeterminant number of cusps, possibly bicuspid. There is severe calcifcation of the aortic valve. Aortic valve regurgitation is mild to moderate. Severe aortic valve stenosis. Aortic valve mean gradient measures 39.5 mmHg. Aortic valve Vmax measures 4.06 m/s. Dimentionless index 0.24. 5. Aortic dilatation noted. There is mild dilatation of the ascending aorta, measuring 38 mm. 6. The inferior vena cava is normal in size with greater than 50% respiratory variability, suggesting right atrial pressure of 3 mmHg.  Comparison(s): A prior study was performed on 08/29/2019. Prior images unable to be directly viewed. Aortic valve structure not  well defined, possibly bicuspid with evidence of severe stenosis. Mean AV gradient 39.5 mmHg and DI 0.24. Mild to moderate aortic regurgitation.  FINDINGS Left Ventricle: Left ventricular ejection fraction, by estimation, is 65 to 70%. The left ventricle has normal function. The left ventricle has no regional wall motion abnormalities. The left ventricular internal cavity size was normal in size. There is moderate concentric left ventricular hypertrophy. Left ventricular diastolic parameters are indeterminate.  Right Ventricle: The right ventricular size is normal. No increase in right ventricular wall thickness. Right ventricular systolic function is normal. There is normal pulmonary artery systolic pressure. The tricuspid regurgitant velocity is 2.85 m/s, and with an assumed right atrial pressure of 3 mmHg, the estimated right ventricular systolic pressure is 35.5 mmHg.  Left Atrium: Left atrial size was normal in size.  Right Atrium: Right atrial size was normal in size.  Pericardium: Trivial pericardial effusion is present. The pericardial effusion is posterior to the left ventricle.  Mitral Valve: The mitral valve is grossly normal. Mild mitral valve regurgitation. MV peak gradient, 2.8 mmHg. The mean mitral valve gradient is 1.0 mmHg.  Tricuspid Valve:  The tricuspid valve is grossly normal. Tricuspid valve regurgitation is mild.  Aortic Valve: The aortic valve has an indeterminant number of cusps. There is severe calcifcation of the aortic valve. There is mild aortic valve annular calcification. Aortic valve regurgitation is mild to moderate. Aortic regurgitation PHT measures 433 msec. Severe aortic stenosis is present. Aortic valve mean gradient measures 39.5 mmHg. Aortic valve peak gradient measures 65.8 mmHg. Aortic valve area, by VTI measures 0.84 cm.  Pulmonic Valve: The pulmonic valve was grossly normal. Pulmonic valve regurgitation is trivial.  Aorta: Aortic dilatation noted. There is mild dilatation of the ascending aorta, measuring 38 mm.  Venous: The inferior vena cava is normal in size with greater than 50% respiratory variability, suggesting right atrial pressure of 3 mmHg.  IAS/Shunts: No atrial level shunt detected by color flow Doppler.  Additional Comments: 3D was performed not requiring image post processing on an independent workstation and was indeterminate.   LEFT VENTRICLE PLAX 2D LVIDd:         5.00 cm     Diastology LVIDs:         2.80 cm     LV e' medial:    4.79 cm/s LV PW:         1.40 cm     LV E/e' medial:  16.6 LV IVS:        1.40 cm     LV e' lateral:   5.55 cm/s LVOT diam:     2.10 cm     LV E/e' lateral: 14.3 LV SV:         78 LV SV Index:   32 LVOT Area:     3.46 cm  LV Volumes (MOD) LV vol d, MOD A2C: 68.6 ml LV vol d, MOD A4C: 65.4 ml LV vol s, MOD A2C: 17.9 ml LV vol s, MOD A4C: 29.5 ml LV SV MOD A2C:     50.7 ml LV SV MOD A4C:     65.4 ml LV SV MOD BP:      43.2 ml  RIGHT VENTRICLE             IVC RV Basal diam:  3.50 cm     IVC diam: 1.90 cm RV Mid diam:    2.70 cm RV S prime:     17.00 cm/s TAPSE (M-mode): 3.2 cm  LEFT  ATRIUM             Index        RIGHT ATRIUM           Index LA diam:        4.20 cm 1.71 cm/m   RA Area:     13.20 cm LA Vol (A2C):   59.8 ml 24.32 ml/m  RA  Volume:   27.90 ml  11.35 ml/m LA Vol (A4C):   66.2 ml 26.92 ml/m LA Biplane Vol: 63.7 ml 25.90 ml/m AORTIC VALVE                     PULMONIC VALVE AV Area (Vmax):    0.87 cm      PV Vmax:       1.24 m/s AV Area (Vmean):   0.86 cm      PV Peak grad:  6.1 mmHg AV Area (VTI):     0.84 cm AV Vmax:           405.50 cm/s AV Vmean:          297.500 cm/s AV VTI:            0.932 m AV Peak Grad:      65.8 mmHg AV Mean Grad:      39.5 mmHg LVOT Vmax:         101.45 cm/s LVOT Vmean:        73.750 cm/s LVOT VTI:          0.226 m LVOT/AV VTI ratio: 0.24 AI PHT:            433 msec AR Vena Contracta: 0.60 cm  AORTA Ao Root diam: 3.10 cm Ao Asc diam:  3.80 cm  MITRAL VALVE               TRICUSPID VALVE MV Area (PHT): 3.74 cm    TR Peak grad:   32.5 mmHg MV Area VTI:   3.14 cm    TR Vmax:        285.00 cm/s MV Peak grad:  2.8 mmHg MV Mean grad:  1.0 mmHg    SHUNTS MV Vmax:       0.84 m/s    Systemic VTI:  0.23 m MV Vmean:      49.4 cm/s   Systemic Diam: 2.10 cm MV Decel Time: 203 msec MV E velocity: 79.30 cm/s MV A velocity: 67.70 cm/s MV E/A ratio:  1.17  Jayson Sierras MD Electronically signed by Jayson Sierras MD Signature Date/Time: 05/02/2024/3:12:16 PM    Final      CT SCANS  CT CORONARY MORPH W/CTA COR W/SCORE 06/12/2024  Addendum 06/14/2024 10:53 AM ADDENDUM REPORT: 06/14/2024 10:51  EXAM: OVER-READ INTERPRETATION  CT CHEST  The following report is an over-read performed by radiologist Dr. Elspeth Dada [facility] on 06/14/2024. This over-read does not include interpretation of cardiac or coronary anatomy or pathology. The cardiovascular interpretation by the cardiologist is attached.  COMPARISON:  Chest CTA dated 06/12/2024 and 08/29/2019.  FINDINGS: Mediastinum: Unremarkable.  Lungs and pleura: Stable right lower lobe nodule with a mean diameter of 7 mm on image number 32/306. Stable additional 3 mm and 1 mm adjacent nodules in a  subpleural location in the right lower lobe on the same image. Stable 4 mm normal perifissural lymph node on image number 21/306. No new or enlarging nodules seen.  Upper abdomen: Unremarkable.  Musculoskeletal: Thoracic spine degenerative changes with changes of  DISH.  IMPRESSION: 1. Stable right lower lobe nodules and normal perifissural lymph node. These are unchanged since 08/29/2019 and are compatible with benign nodules, not needing imaging follow-up. 2. No acute abnormality.   Electronically Signed By: Elspeth Bathe M.D. On: 06/14/2024 10:51  Narrative CLINICAL DATA:  66M with severe aortic stenosis being evaluated for a TAVR procedure.  EXAM: Cardiac TAVR CT  TECHNIQUE: A non-contrast, gated CT scan was obtained with axial slices of 2.5 mm through the heart for aortic valve scoring. A 120 kV retrospective, gated, contrast cardiac scan was obtained. Gantry rotation speed was 230 msec and collimation was 0.63 mm. Nitroglycerin was not given. A delayed scan was obtained to exclude left atrial appendage thrombus. The 3D dataset was reconstructed in systole with motion correction. The 3D data set was reconstructed in 5% intervals of the 0-95% of the R-R cycle. Systolic and diastolic phases were analyzed on a dedicated workstation using MPR, MIP, and VRT modes. The patient received 100 cc of contrast.  FINDINGS: Aortic Root:  Aortic valve: Bicuspid aortic valve, Sievers type 0  Aortic valve calcium score: 3686  Aortic annulus:  Diameter: 29mm x 24mm  Perimeter: 82mm  Area: 51mm^2  Calcifications: Mild calcification adjacent to noncoronary cusp  Coronary height: Min Left - 23mm; Min Right - 22mm  Sinotubular height: Left cusp - 31mm; Right cusp - 30mm; Noncoronary cusp - 32mm  LVOT (as measured 3 mm below the annulus):  Diameter: 30mm x 23mm  Area: 535mm^2  Calcifications: No calcifications  Aortic sinus width: Max 40mm, Min 29mm  Sinotubular  junction width: 36mm x 70m  Optimum Fluoroscopic Angle for Delivery: LAO 36 CRA 32  Cardiac:  Right atrium: Normal size  Right ventricle: Normal size  Pulmonary arteries: Normal size  Pulmonary veins: Normal configuration  Left atrium: Normal size  Left ventricle: Normal size  Pericardium: Normal thickness  Coronary arteries: Coronary calcium score 0  IMPRESSION: 1. Bicuspid aortic valve, Sievers type 0, with severe calcifications (AV calcium score 3686)  2. Aortic annulus measures 29mm x 24mm in diameter with perimeter 82mm and area 510mm^2. Mild annular calcification adjacent to noncoronary cusp. Annular measurements suitable for delivery of 26mm Edwards Sapien 3 valve  3. Sufficient coronary to annulus distance.  4. Coronary calcium score 0  Electronically Signed: By: Lonni Nanas M.D. On: 06/13/2024 11:32     ______________________________________________________________________________________________     EKG: sinus I have independently reviewed the above radiologic studies and discussed with the patient   Recent Lab Findings: Lab Results  Component Value Date   WBC 4.6 05/30/2024   HGB 15.0 05/30/2024   HCT 44.6 05/30/2024   PLT 206 05/30/2024   GLUCOSE 137 (H) 05/30/2024   CHOL 190 01/02/2024   TRIG 99 01/02/2024   HDL 46 01/02/2024   LDLCALC 126 (H) 01/02/2024   ALT 19 01/02/2024   AST 16 01/02/2024   NA 141 05/30/2024   K 4.3 05/30/2024   CL 105 05/30/2024   CREATININE 0.90 05/30/2024   BUN 10 05/30/2024   CO2 21 05/30/2024   TSH 7.935 (H) 08/28/2019   INR 1.0 06/06/2024   HGBA1C 7.4 (H) 04/02/2024    Intervention   Assessment / Plan:   63 y.o. male with a bicuspid aortic valve, and severe aortic stenosis.  He also has a history of paroxysmal atrial flutter, but has been in sinus on most recent EKG.  He does not have any coronary disease, and he has preserved biventricular function.  He does not have an ascending aortic  aneurysm.  The risks and benefits of a bioprosthetic aortic valve replacement and atriclip placement were discussed in detail.  The patient is agreeable to proceed.  He will need to hold his coumadin  prior to surgery.      I  spent 40 minutes counseling the patient face to face.   Linnie MALVA Rayas 06/21/2024 10:55 AM

## 2024-06-21 NOTE — H&P (View-Only) (Signed)
 [address]       [place] [address]             [phone]        ANSEL FERRALL [facility] Medical Record #[id number] Date of Birth: 02/03/1961  Referring: Verlin Lonni BIRCH* Primary Care: Grooms, [place]arm[place], [place] Primary Cardiologist:Vishnu SHAUNNA May[place], MD  Chief Complaint:    Chief Complaint  Patient presents with   Aortic Stenosis    Surgical consult    History of Present Illness:     Johnathan Richmond is a 63 y.o. male who presents for surgical evaluation of severe aortic stenosis.  He has a history of DM, and paroxysmal atrial flutter.  He has been symptomatic with shortness of breath.  He denies any chest pain, or presyncope.     Past Medical and Surgical History: Previous Chest Surgery: no Previous Chest Radiation: no Diabetes Mellitus: yes.  HbA1C 7.4 Creatinine:  Lab Results  Component Value Date   CREATININE 0.90 05/30/2024   CREATININE 0.89 05/02/2024   CREATININE 0.97 01/02/2024     Past Medical History:  Diagnosis Date   Aortic stenosis    Diabetes mellitus (HCC)    Essential hypertension    Hypothyroidism    Morbid obesity (HCC)    Paroxysmal atrial flutter (HCC)     Past Surgical History:  Procedure Laterality Date   CHOLECYSTECTOMY     CORONARY ANGIOGRAPHY N/A 06/06/2024   Procedure: CORONARY ANGIOGRAPHY;  Surgeon: Verlin Lonni BIRCH, MD;  Location: [facility] INVASIVE CV [facility];  Service: Cardiovascular;  Laterality: N/A;    Social History:  Social History   Tobacco Use  Smoking Status Never  Smokeless Tobacco Never    Social History   Substance and Sexual Activity  Alcohol Use No     No Known Allergies  Medications:  Anti-Coagulation: coumadin   Current Outpatient Medications  Medication Sig Dispense Refill   insulin  glargine (LANTUS  SOLOSTAR) 100 UNIT/ML Solostar Pen Inject 20 Units into the skin daily. 15 mL 1   Insulin  Pen Needle (PEN NEEDLES) 31G X 5 MM MISC 1 each by Does not apply route  daily. May substitute to any manufacturer covered by patient's insurance. 100 each 0   metFORMIN  (GLUCOPHAGE -XR) 750 MG 24 hr tablet Take 1 tablet (750 mg total) by mouth daily with breakfast. 90 tablet 0   warfarin (COUMADIN ) 3 MG tablet Take 1 tablet (3 mg total) by mouth daily. 30 tablet 5   No current facility-administered medications for this visit.    (Not in a hospital admission)   Family History  Problem Relation Age of Onset   Schizophrenia Mother    COPD Mother    Epilepsy Father    Schizophrenia Brother      Review of Systems:   Review of Systems  Constitutional:  Positive for malaise/fatigue.  Respiratory:  Positive for shortness of breath.   Cardiovascular:  Negative for chest pain.  Neurological:  Positive for dizziness.      Physical Exam: BP (!) 150/83   Pulse 80   Resp 20   Ht 6' 2 (1.88 m)   Wt 267 lb (121.1 kg)   SpO2 96% Comment: RA  BMI 34.28 kg/m  Physical Exam Constitutional:      General: He is not in acute distress.    Appearance: He is not ill-appearing.  HENT:     Head: Normocephalic and atraumatic.  Eyes:     Extraocular Movements: Extraocular movements intact.  Cardiovascular:     Rate and Rhythm: Normal rate.  Pulmonary:     Effort: Pulmonary effort is normal. No respiratory distress.  Abdominal:     General: Abdomen is flat. There is no distension.  Musculoskeletal:        General: Normal range of motion.     Cervical back: Normal range of motion.  Skin:    General: Skin is warm and dry.  Neurological:     General: No focal deficit present.     Mental Status: He is alert and oriented to person, place, and time.       Diagnostic Studies & Laboratory data: Cardiac Studies & Procedures   ______________________________________________________________________________________________ CARDIAC CATHETERIZATION  CARDIAC CATHETERIZATION 06/06/2024  Conclusion No angiographic evidence of CAD  Recommendations: Continue workup  for AVR.  Findings Coronary Findings Diagnostic  Dominance: Right  Left Anterior Descending Vessel is large.  Left Circumflex Vessel is large.  Right Coronary Artery Vessel is large.  Intervention  No interventions have been documented.     ECHOCARDIOGRAM  ECHOCARDIOGRAM COMPLETE 05/02/2024  Narrative ECHOCARDIOGRAM REPORT    Patient Name:   Johnathan Richmond Date of Exam: 05/02/2024 Medical Rec #:  [id number]        Height:       74.0 in Accession #:    [id number]       Weight:       267.4 lb Date of Birth:  02-09-1961       BSA:          2.459 m Patient Age:    62 years         BP:           128/72 mmHg Patient Gender: M                HR:           71 bpm. Exam Location:  [facility]  Procedure: 2D Echo, Cardiac Doppler and Color Doppler (Both Spectral and Color Flow Doppler were utilized during procedure).  Indications:    I35.0 Nonrheumatic aortic (valve) stenosis  History:        Patient has prior history of Echocardiogram examinations, most recent 08/29/2019. Aortic Valve Disease, Arrythmias:Atrial Fibrillation and Atrial Flutter, Signs/Symptoms:Chest Pain, Dyspnea and Dizziness/Lightheadedness; Risk Factors:Hypertension, Diabetes, Dyslipidemia, Morbid obesity and Non-Smoker.  Sonographer:    Bascom Burows RCS, RVS Referring Phys: [id number] VISHNU P MALLIPEDDI   Sonographer Comments: Global longitudinal strain was attempted. IMPRESSIONS   1. Left ventricular ejection fraction, by estimation, is 65 to 70%. The left ventricle has normal function. The left ventricle has no regional wall motion abnormalities. There is moderate concentric left ventricular hypertrophy. Left ventricular diastolic parameters are indeterminate. 2. Right ventricular systolic function is normal. The right ventricular size is normal. There is normal pulmonary artery systolic pressure. 3. The mitral valve is grossly normal. Mild mitral valve regurgitation. 4. The aortic valve has an  indeterminant number of cusps, possibly bicuspid. There is severe calcifcation of the aortic valve. Aortic valve regurgitation is mild to moderate. Severe aortic valve stenosis. Aortic valve mean gradient measures 39.5 mmHg. Aortic valve Vmax measures 4.06 m/s. Dimentionless index 0.24. 5. Aortic dilatation noted. There is mild dilatation of the ascending aorta, measuring 38 mm. 6. The inferior vena cava is normal in size with greater than 50% respiratory variability, suggesting right atrial pressure of 3 mmHg.  Comparison(s): A prior study was performed on 08/29/2019. Prior images unable to be directly viewed. Aortic valve structure not  well defined, possibly bicuspid with evidence of severe stenosis. Mean AV gradient 39.5 mmHg and DI 0.24. Mild to moderate aortic regurgitation.  FINDINGS Left Ventricle: Left ventricular ejection fraction, by estimation, is 65 to 70%. The left ventricle has normal function. The left ventricle has no regional wall motion abnormalities. The left ventricular internal cavity size was normal in size. There is moderate concentric left ventricular hypertrophy. Left ventricular diastolic parameters are indeterminate.  Right Ventricle: The right ventricular size is normal. No increase in right ventricular wall thickness. Right ventricular systolic function is normal. There is normal pulmonary artery systolic pressure. The tricuspid regurgitant velocity is 2.85 m/s, and with an assumed right atrial pressure of 3 mmHg, the estimated right ventricular systolic pressure is 35.5 mmHg.  Left Atrium: Left atrial size was normal in size.  Right Atrium: Right atrial size was normal in size.  Pericardium: Trivial pericardial effusion is present. The pericardial effusion is posterior to the left ventricle.  Mitral Valve: The mitral valve is grossly normal. Mild mitral valve regurgitation. MV peak gradient, 2.8 mmHg. The mean mitral valve gradient is 1.0 mmHg.  Tricuspid Valve:  The tricuspid valve is grossly normal. Tricuspid valve regurgitation is mild.  Aortic Valve: The aortic valve has an indeterminant number of cusps. There is severe calcifcation of the aortic valve. There is mild aortic valve annular calcification. Aortic valve regurgitation is mild to moderate. Aortic regurgitation PHT measures 433 msec. Severe aortic stenosis is present. Aortic valve mean gradient measures 39.5 mmHg. Aortic valve peak gradient measures 65.8 mmHg. Aortic valve area, by VTI measures 0.84 cm.  Pulmonic Valve: The pulmonic valve was grossly normal. Pulmonic valve regurgitation is trivial.  Aorta: Aortic dilatation noted. There is mild dilatation of the ascending aorta, measuring 38 mm.  Venous: The inferior vena cava is normal in size with greater than 50% respiratory variability, suggesting right atrial pressure of 3 mmHg.  IAS/Shunts: No atrial level shunt detected by color flow Doppler.  Additional Comments: 3D was performed not requiring image post processing on an independent workstation and was indeterminate.   LEFT VENTRICLE PLAX 2D LVIDd:         5.00 cm     Diastology LVIDs:         2.80 cm     LV e' medial:    4.79 cm/s LV PW:         1.40 cm     LV E/e' medial:  16.6 LV IVS:        1.40 cm     LV e' lateral:   5.55 cm/s LVOT diam:     2.10 cm     LV E/e' lateral: 14.3 LV SV:         78 LV SV Index:   32 LVOT Area:     3.46 cm  LV Volumes (MOD) LV vol d, MOD A2C: 68.6 ml LV vol d, MOD A4C: 65.4 ml LV vol s, MOD A2C: 17.9 ml LV vol s, MOD A4C: 29.5 ml LV SV MOD A2C:     50.7 ml LV SV MOD A4C:     65.4 ml LV SV MOD BP:      43.2 ml  RIGHT VENTRICLE             IVC RV Basal diam:  3.50 cm     IVC diam: 1.90 cm RV Mid diam:    2.70 cm RV S prime:     17.00 cm/s TAPSE (M-mode): 3.2 cm  LEFT  ATRIUM             Index        RIGHT ATRIUM           Index LA diam:        4.20 cm 1.71 cm/m   RA Area:     13.20 cm LA Vol (A2C):   59.8 ml 24.32 ml/m  RA  Volume:   27.90 ml  11.35 ml/m LA Vol (A4C):   66.2 ml 26.92 ml/m LA Biplane Vol: 63.7 ml 25.90 ml/m AORTIC VALVE                     PULMONIC VALVE AV Area (Vmax):    0.87 cm      PV Vmax:       1.24 m/s AV Area (Vmean):   0.86 cm      PV Peak grad:  6.1 mmHg AV Area (VTI):     0.84 cm AV Vmax:           405.50 cm/s AV Vmean:          297.500 cm/s AV VTI:            0.932 m AV Peak Grad:      65.8 mmHg AV Mean Grad:      39.5 mmHg LVOT Vmax:         101.45 cm/s LVOT Vmean:        73.750 cm/s LVOT VTI:          0.226 m LVOT/AV VTI ratio: 0.24 AI PHT:            433 msec AR Vena Contracta: 0.60 cm  AORTA Ao Root diam: 3.10 cm Ao Asc diam:  3.80 cm  MITRAL VALVE               TRICUSPID VALVE MV Area (PHT): 3.74 cm    TR Peak grad:   32.5 mmHg MV Area VTI:   3.14 cm    TR Vmax:        285.00 cm/s MV Peak grad:  2.8 mmHg MV Mean grad:  1.0 mmHg    SHUNTS MV Vmax:       0.84 m/s    Systemic VTI:  0.23 m MV Vmean:      49.4 cm/s   Systemic Diam: 2.10 cm MV Decel Time: 203 msec MV E velocity: 79.30 cm/s MV A velocity: 67.70 cm/s MV E/A ratio:  1.17  Jayson Sierras MD Electronically signed by Jayson Sierras MD Signature Date/Time: 05/02/2024/3:12:16 PM    Final      CT SCANS  CT CORONARY MORPH W/CTA COR W/SCORE 06/12/2024  Addendum 06/14/2024 10:53 AM ADDENDUM REPORT: 06/14/2024 10:51  EXAM: OVER-READ INTERPRETATION  CT CHEST  The following report is an over-read performed by radiologist Dr. Elspeth Dada [facility] on 06/14/2024. This over-read does not include interpretation of cardiac or coronary anatomy or pathology. The cardiovascular interpretation by the cardiologist is attached.  COMPARISON:  Chest CTA dated 06/12/2024 and 08/29/2019.  FINDINGS: Mediastinum: Unremarkable.  Lungs and pleura: Stable right lower lobe nodule with a mean diameter of 7 mm on image number 32/306. Stable additional 3 mm and 1 mm adjacent nodules in a  subpleural location in the right lower lobe on the same image. Stable 4 mm normal perifissural lymph node on image number 21/306. No new or enlarging nodules seen.  Upper abdomen: Unremarkable.  Musculoskeletal: Thoracic spine degenerative changes with changes of  DISH.  IMPRESSION: 1. Stable right lower lobe nodules and normal perifissural lymph node. These are unchanged since 08/29/2019 and are compatible with benign nodules, not needing imaging follow-up. 2. No acute abnormality.   Electronically Signed By: Elspeth Bathe M.D. On: 06/14/2024 10:51  Narrative CLINICAL DATA:  66M with severe aortic stenosis being evaluated for a TAVR procedure.  EXAM: Cardiac TAVR CT  TECHNIQUE: A non-contrast, gated CT scan was obtained with axial slices of 2.5 mm through the heart for aortic valve scoring. A 120 kV retrospective, gated, contrast cardiac scan was obtained. Gantry rotation speed was 230 msec and collimation was 0.63 mm. Nitroglycerin was not given. A delayed scan was obtained to exclude left atrial appendage thrombus. The 3D dataset was reconstructed in systole with motion correction. The 3D data set was reconstructed in 5% intervals of the 0-95% of the R-R cycle. Systolic and diastolic phases were analyzed on a dedicated workstation using MPR, MIP, and VRT modes. The patient received 100 cc of contrast.  FINDINGS: Aortic Root:  Aortic valve: Bicuspid aortic valve, Sievers type 0  Aortic valve calcium score: 3686  Aortic annulus:  Diameter: 29mm x 24mm  Perimeter: 82mm  Area: 51mm^2  Calcifications: Mild calcification adjacent to noncoronary cusp  Coronary height: Min Left - 23mm; Min Right - 22mm  Sinotubular height: Left cusp - 31mm; Right cusp - 30mm; Noncoronary cusp - 32mm  LVOT (as measured 3 mm below the annulus):  Diameter: 30mm x 23mm  Area: 535mm^2  Calcifications: No calcifications  Aortic sinus width: Max 40mm, Min 29mm  Sinotubular  junction width: 36mm x 70m  Optimum Fluoroscopic Angle for Delivery: LAO 36 CRA 32  Cardiac:  Right atrium: Normal size  Right ventricle: Normal size  Pulmonary arteries: Normal size  Pulmonary veins: Normal configuration  Left atrium: Normal size  Left ventricle: Normal size  Pericardium: Normal thickness  Coronary arteries: Coronary calcium score 0  IMPRESSION: 1. Bicuspid aortic valve, Sievers type 0, with severe calcifications (AV calcium score 3686)  2. Aortic annulus measures 29mm x 24mm in diameter with perimeter 82mm and area 510mm^2. Mild annular calcification adjacent to noncoronary cusp. Annular measurements suitable for delivery of 26mm Edwards Sapien 3 valve  3. Sufficient coronary to annulus distance.  4. Coronary calcium score 0  Electronically Signed: By: Lonni Nanas M.D. On: 06/13/2024 11:32     ______________________________________________________________________________________________     EKG: sinus I have independently reviewed the above radiologic studies and discussed with the patient   Recent Lab Findings: Lab Results  Component Value Date   WBC 4.6 05/30/2024   HGB 15.0 05/30/2024   HCT 44.6 05/30/2024   PLT 206 05/30/2024   GLUCOSE 137 (H) 05/30/2024   CHOL 190 01/02/2024   TRIG 99 01/02/2024   HDL 46 01/02/2024   LDLCALC 126 (H) 01/02/2024   ALT 19 01/02/2024   AST 16 01/02/2024   NA 141 05/30/2024   K 4.3 05/30/2024   CL 105 05/30/2024   CREATININE 0.90 05/30/2024   BUN 10 05/30/2024   CO2 21 05/30/2024   TSH 7.935 (H) 08/28/2019   INR 1.0 06/06/2024   HGBA1C 7.4 (H) 04/02/2024    Intervention   Assessment / Plan:   63 y.o. male with a bicuspid aortic valve, and severe aortic stenosis.  He also has a history of paroxysmal atrial flutter, but has been in sinus on most recent EKG.  He does not have any coronary disease, and he has preserved biventricular function.  He does not have an ascending aortic  aneurysm.  The risks and benefits of a bioprosthetic aortic valve replacement and atriclip placement were discussed in detail.  The patient is agreeable to proceed.  He will need to hold his coumadin  prior to surgery.      I  spent 40 minutes counseling the patient face to face.   Linnie MALVA Rayas 06/21/2024 10:55 AM

## 2024-06-21 NOTE — Telephone Encounter (Signed)
   Pre-operative Risk Assessment    Patient Name: Johnathan Richmond  DOB: June 21, 1961 MRN: [id number]   Date of last office visit: 05/30/24 Date of next office visit: 08/20/24   Request for Surgical Clearance    Procedure:  AVR, Clipping left atrial appendage  Date of Surgery:  Clearance 07/01/24                                Surgeon:  Dr. Shyrl Socks Group or Practice Name:  [facility] Phone number:  [phone] Fax number:  [phone]   Type of Clearance Requested:   - Medical  - Pharmacy:  Hold Warfarin (Coumadin ) x 3days    Type of Anesthesia:  Not Indicated   Additional requests/questions:    [name]   06/21/2024, 5:06 PM

## 2024-06-25 NOTE — Telephone Encounter (Signed)
 Requesting inquiring if pt has been cleared. Procedure 07/01/24 with med hold x 3 days

## 2024-06-26 NOTE — Telephone Encounter (Signed)
   Patient Name: Johnathan Richmond  DOB: 11-12-1960 MRN: [id number]  Primary Cardiologist: Vishnu P Mallipeddi, MD  Chart reviewed as part of pre-operative protocol coverage. Given past medical history and time since last visit, based on ACC/AHA guidelines, ACHILLES NEVILLE is at acceptable risk for the planned procedure without further cardiovascular testing. Dr. Verlin has seen him and is planning AVR Clipping left atrial appendage  per notes.   Per office protocol, patient can hold warfarin for 3-5 days prior to procedure.  Patient will not need bridging with Lovenox  (enoxaparin ) around procedure.  The patient was advised that if he develops new symptoms prior to surgery to contact our office to arrange for a follow-up visit, and he verbalized understanding.  I will route this recommendation to the requesting party via Epic fax function and remove from pre-op pool.  Please call with questions.  Lamarr Satterfield, NP 06/26/2024, 3:52 PM

## 2024-06-26 NOTE — Telephone Encounter (Signed)
 Patient with diagnosis of aflutter on warfarin for anticoagulation.    Procedure:  AVR, Clipping left atrial appendage  Date of procedure: 07/01/24   CHA2DS2-VASc Score = 2   This indicates a 2.2% annual risk of stroke. The patient's score is based upon: CHF History: 0 HTN History: 1 Diabetes History: 1 Stroke History: 0 Vascular Disease History: 0 Age Score: 0 Gender Score: 0       Patient has not had an Afib/aflutter ablation in the last 3 months, DCCV within the last 4 weeks or a watchman implanted in the last 45 days   Per office protocol, patient can hold warfarin for 3-5 days prior to procedure.   Patient will not need bridging with Lovenox  (enoxaparin ) around procedure.  **This guidance is not considered finalized until pre-operative APP has relayed final recommendations.**

## 2024-06-26 NOTE — Telephone Encounter (Signed)
 Pharmacy please advise on holding coumadin  prior to  AVR, Clipping left atrial appendage  scheduled for 07/01/2024. Last labs (CBC 06/22/2027)  (BMET-05/02/2024) Thank you.

## 2024-06-27 NOTE — Progress Notes (Signed)
 Surgical Instructions   Your procedure is scheduled on Monday, October 27th, 2025. Report to [facility] Main Entrance A at 5:30 A.M., then check in with the Admitting office. Any questions or running late day of surgery: call [phone]  Questions prior to your surgery date: call [phone], Monday-Friday, 8am-4pm. If you experience any cold or flu symptoms such as cough, fever, chills, shortness of breath, etc. between now and your scheduled surgery, please notify us  at the above number.     Remember:  Do not eat or drink after midnight the night before your surgery     Take these medicines the morning of surgery with A SIP OF WATER: None.    May take these medicines IF NEEDED: None.   Per your surgeon's instructions, Warfarin (Coumadin ) should be stopped 3 days prior to surgery.  Your last dose of Warfarin (Coumadin ) should be on Thursday, October 23rd.  Per your surgeon's instructions, Metformin  (Glucophage -XR) should be stopped 2 days prior to surgery.  Your last dose of Metformin  (Glocophage-XR) should be on Friday, October 24th.      One week prior to surgery, STOP taking any Aleve , Naproxen , Ibuprofen, Motrin, Advil, Goody's, BC's, all herbal medications, fish oil, and non-prescription vitamins.    WHAT DO I DO ABOUT MY DIABETES MEDICATION?   If you take Insulin  Glargine (Lantus  Solostar) in the evening, then THE NIGHT BEFORE SURGERY, take _10_ units of _Insulin Glargine (Lantus  Solostar)__insulin.       If you take Insulin  Glargine (Lantus  Solostar) in the mornings, then THE MORNING OF SURGERY, take _10__ units of __Insulin Glargine (Lantus  Solostar)__insulin.    HOW TO MANAGE YOUR DIABETES BEFORE AND AFTER SURGERY  Why is it important to control my blood sugar before and after surgery? Improving blood sugar levels before and after surgery helps healing and can limit problems. A way of improving blood sugar control is eating a healthy diet by:  Eating  less sugar and carbohydrates  Increasing activity/exercise  Talking with your doctor about reaching your blood sugar goals High blood sugars (greater than 180 mg/dL) can raise your risk of infections and slow your recovery, so you will need to focus on controlling your diabetes during the weeks before surgery. Make sure that the doctor who takes care of your diabetes knows about your planned surgery including the date and location.  How do I manage my blood sugar before surgery? Check your blood sugar at least 4 times a day, starting 2 days before surgery, to make sure that the level is not too high or low.  Check your blood sugar the morning of your surgery when you wake up and every 2 hours until you get to the Short Stay unit.  If your blood sugar is less than 70 mg/dL, you will need to treat for low blood sugar: Do not take insulin . Treat a low blood sugar (less than 70 mg/dL) with  cup of clear juice (cranberry or apple), 4 glucose tablets, OR glucose gel. Recheck blood sugar in 15 minutes after treatment (to make sure it is greater than 70 mg/dL). If your blood sugar is not greater than 70 mg/dL on recheck, call [phone] for further instructions. Report your blood sugar to the short stay nurse when you get to Short Stay.  If you are admitted to the hospital after surgery: Your blood sugar will be checked by the staff and you will probably be given insulin  after surgery (instead of oral diabetes medicines) to make sure you have  good blood sugar levels. The goal for blood sugar control after surgery is 80-180 mg/dL.                      Do NOT Smoke (Tobacco/Vaping) for 24 hours prior to your procedure.  If you use a CPAP at night, you may bring your mask/headgear for your overnight stay.   You will be asked to remove any contacts, glasses, piercing's, hearing aid's, dentures/partials prior to surgery. Please bring cases for these items if needed.    Patients discharged the day  of surgery will not be allowed to drive home, and someone needs to stay with them for 24 hours.  SURGICAL WAITING ROOM VISITATION Patients may have no more than 2 support people in the waiting area - these visitors may rotate.   Pre-op nurse will coordinate an appropriate time for 1 ADULT support person, who may not rotate, to accompany patient in pre-op.  Children under the age of 21 must have an adult with them who is not the patient and must remain in the main waiting area with an adult.  If the patient needs to stay at the hospital during part of their recovery, the visitor guidelines for inpatient rooms apply.  Please refer to the [facility] website for the visitor guidelines for any additional information.   If you received a COVID test during your pre-op visit  it is requested that you wear a mask when out in public, stay away from anyone that may not be feeling well and notify your surgeon if you develop symptoms. If you have been in contact with anyone that has tested positive in the last 10 days please notify you surgeon.      Pre-operative CHG Bathing Instructions   You can play a key role in reducing the risk of infection after surgery. Your skin needs to be as free of germs as possible. You can reduce the number of germs on your skin by washing with CHG (chlorhexidine gluconate) soap before surgery. CHG is an antiseptic soap that kills germs and continues to kill germs even after washing.   DO NOT use if you have an allergy to chlorhexidine/CHG or antibacterial soaps. If your skin becomes reddened or irritated, stop using the CHG and notify one of our RNs at [phone].              TAKE A SHOWER THE NIGHT BEFORE SURGERY   Please keep in mind the following:  DO NOT shave, including legs and underarms, 48 hours prior to surgery.   You may shave your face before/day of surgery.  Place clean sheets on your bed the night before surgery Use a clean washcloth (not used since  being washed) for shower. DO NOT sleep with pet's night before surgery.  CHG Shower Instructions:  Wash your face and private area with normal soap. If you choose to wash your hair, wash first with your normal shampoo.  After you use shampoo/soap, rinse your hair and body thoroughly to remove shampoo/soap residue.  Turn the water OFF and apply half the bottle of CHG soap to a CLEAN washcloth.  Apply CHG soap ONLY FROM YOUR NECK DOWN TO YOUR TOES (washing for 3-5 minutes)  DO NOT use CHG soap on face, private areas, open wounds, or sores.  Pay special attention to the area where your surgery is being performed.  If you are having back surgery, having someone wash your back for you may be helpful.  Wait 2 minutes after CHG soap is applied, then you may rinse off the CHG soap.  Pat dry with a clean towel  Put on clean pajamas    Additional instructions for the day of surgery: If you choose, you may shower the morning of surgery with an antibacterial soap.  DO NOT APPLY any lotions, deodorants, cologne, or perfumes.   Do not wear jewelry or makeup Do not wear nail polish, gel polish, artificial nails, or any other type of covering on natural nails (fingers and toes) Do not bring valuables to the hospital. [facility] is not responsible for valuables/personal belongings. Put on clean/comfortable clothes.  Please brush your teeth.  Ask your nurse before applying any prescription medications to the skin.

## 2024-06-28 ENCOUNTER — Encounter (HOSPITAL_COMMUNITY): Payer: Self-pay

## 2024-06-28 ENCOUNTER — Encounter (HOSPITAL_COMMUNITY)
Admission: RE | Admit: 2024-06-28 | Discharge: 2024-06-28 | Disposition: A | Discharge status: Home / Self Care | Source: Ambulatory Visit | Attending: Thoracic Surgery (Cardiothoracic Vascular Surgery) | Admitting: Thoracic Surgery (Cardiothoracic Vascular Surgery)

## 2024-06-28 ENCOUNTER — Inpatient Hospital Stay (HOSPITAL_BASED_OUTPATIENT_CLINIC_OR_DEPARTMENT_OTHER)
Admission: RE | Admit: 2024-06-28 | Discharge: 2024-06-28 | Disposition: A | Discharge status: Home / Self Care | Source: Ambulatory Visit | Attending: Thoracic Surgery (Cardiothoracic Vascular Surgery) | Admitting: Thoracic Surgery (Cardiothoracic Vascular Surgery)

## 2024-06-28 ENCOUNTER — Other Ambulatory Visit: Payer: Self-pay

## 2024-06-28 ENCOUNTER — Ambulatory Visit (HOSPITAL_COMMUNITY)
Admission: RE | Admit: 2024-06-28 | Discharge: 2024-06-28 | Disposition: A | Discharge status: Home / Self Care | Source: Ambulatory Visit | Attending: Thoracic Surgery (Cardiothoracic Vascular Surgery) | Admitting: Thoracic Surgery (Cardiothoracic Vascular Surgery)

## 2024-06-28 VITALS — BP 144/60 | HR 72 | Temp 97.8°F | Resp 18 | Ht 74.0 in | Wt 270.9 lb

## 2024-06-28 DIAGNOSIS — I480 Paroxysmal atrial fibrillation: Secondary | ICD-10-CM | POA: Insufficient documentation

## 2024-06-28 DIAGNOSIS — I350 Nonrheumatic aortic (valve) stenosis: Secondary | ICD-10-CM

## 2024-06-28 DIAGNOSIS — Q2381 Bicuspid aortic valve: Secondary | ICD-10-CM | POA: Insufficient documentation

## 2024-06-28 DIAGNOSIS — Z01818 Encounter for other preprocedural examination: Secondary | ICD-10-CM

## 2024-06-28 HISTORY — DX: Paroxysmal atrial fibrillation: I48.0

## 2024-06-28 HISTORY — DX: Dyspnea, unspecified: R06.00

## 2024-06-28 HISTORY — DX: Cardiac murmur, unspecified: R01.1

## 2024-06-28 LAB — TYPE AND SCREEN
ABO/RH(D): O NEG
Antibody Screen: NEGATIVE

## 2024-06-28 LAB — HEMOGLOBIN A1C
Hgb A1c MFr Bld: 6.5 % — ABNORMAL HIGH (ref 4.8–5.6)
Mean Plasma Glucose: 139.85 mg/dL

## 2024-06-28 LAB — CBC
HCT: 47.4 % (ref 39.0–52.0)
Hemoglobin: 16 g/dL (ref 13.0–17.0)
MCH: 27.5 pg (ref 26.0–34.0)
MCHC: 33.8 g/dL (ref 30.0–36.0)
MCV: 81.6 fL (ref 80.0–100.0)
Platelets: 229 K/uL (ref 150–400)
RBC: 5.81 MIL/uL (ref 4.22–5.81)
RDW: 13.9 % (ref 11.5–15.5)
WBC: 4.5 K/uL (ref 4.0–10.5)
nRBC: 0 % (ref 0.0–0.2)

## 2024-06-28 LAB — COMPREHENSIVE METABOLIC PANEL WITH GFR
ALT: 19 U/L (ref 0–44)
AST: 17 U/L (ref 15–41)
Albumin: 3.7 g/dL (ref 3.5–5.0)
Alkaline Phosphatase: 62 U/L (ref 38–126)
Anion gap: 7 (ref 5–15)
BUN: 9 mg/dL (ref 8–23)
CO2: 27 mmol/L (ref 22–32)
Calcium: 8.9 mg/dL (ref 8.9–10.3)
Chloride: 103 mmol/L (ref 98–111)
Creatinine, Ser: 0.95 mg/dL (ref 0.61–1.24)
GFR, Estimated: >60 mL/min (ref >60)
Glucose, Bld: 156 mg/dL — ABNORMAL HIGH (ref 70–99)
Potassium: 4.3 mmol/L (ref 3.5–5.1)
Sodium: 137 mmol/L (ref 135–145)
Total Bilirubin: 0.9 mg/dL (ref 0.0–1.2)
Total Protein: 7.3 g/dL (ref 6.5–8.1)

## 2024-06-28 LAB — URINALYSIS, ROUTINE W REFLEX MICROSCOPIC
Bilirubin Urine: NEGATIVE
Glucose, UA: 50 mg/dL — AB
Hgb urine dipstick: NEGATIVE
Ketones, ur: NEGATIVE mg/dL
Leukocytes,Ua: NEGATIVE
Nitrite: NEGATIVE
Protein, ur: NEGATIVE mg/dL
Specific Gravity, Urine: 1.02 (ref 1.005–1.030)
pH: 5 (ref 5.0–8.0)

## 2024-06-28 LAB — SURGICAL PCR SCREEN
MRSA, PCR: NEGATIVE
Staphylococcus aureus: NEGATIVE

## 2024-06-28 LAB — APTT: aPTT: 30 s (ref 24–36)

## 2024-06-28 LAB — GLUCOSE, CAPILLARY: Glucose-Capillary: 170 mg/dL — ABNORMAL HIGH (ref 70–99)

## 2024-06-28 LAB — PROTIME-INR
INR: 0.9 (ref 0.8–1.2)
Prothrombin Time: 13.2 s (ref 11.4–15.2)

## 2024-06-28 MED ORDER — TRANEXAMIC ACID (OHS) BOLUS VIA INFUSION
15.0000 mg/kg | INTRAVENOUS | Status: AC
  Administered 2024-07-01: 1816.5 mg via INTRAVENOUS
  Filled 2024-06-28: qty 1817

## 2024-06-28 MED ORDER — INSULIN REGULAR(HUMAN) IN NACL 100-0.9 UT/100ML-% IV SOLN
INTRAVENOUS | Status: AC
  Administered 2024-07-01: 6 [IU]/h via INTRAVENOUS
  Filled 2024-06-28: qty 100

## 2024-06-28 MED ORDER — NITROGLYCERIN IN D5W 200-5 MCG/ML-% IV SOLN
2.0000 ug/min | INTRAVENOUS | Status: DC
  Filled 2024-06-28: qty 250

## 2024-06-28 MED ORDER — HEPARIN 30,000 UNITS/1000 ML (OHS) CELLSAVER SOLUTION
Status: DC
  Filled 2024-06-28: qty 1000

## 2024-06-28 MED ORDER — NOREPINEPHRINE 4 MG/250ML-% IV SOLN
0.0000 ug/min | INTRAVENOUS | Status: DC
  Filled 2024-06-28: qty 250

## 2024-06-28 MED ORDER — PLASMA-LYTE A IV SOLN
INTRAVENOUS | Status: DC
  Filled 2024-06-28: qty 2.5

## 2024-06-28 MED ORDER — MAGNESIUM SULFATE 50 % IJ SOLN
INTRAMUSCULAR | Status: DC
  Filled 2024-06-28: qty 13

## 2024-06-28 MED ORDER — PHENYLEPHRINE HCL-NACL 20-0.9 MG/250ML-% IV SOLN
30.0000 ug/min | INTRAVENOUS | Status: AC
Start: 2024-07-01 — End: 2024-07-01
  Administered 2024-07-01: 30 ug/min via INTRAVENOUS
  Filled 2024-06-28: qty 250

## 2024-06-28 MED ORDER — DEXMEDETOMIDINE HCL IN NACL 400 MCG/100ML IV SOLN
0.1000 ug/kg/h | INTRAVENOUS | Status: AC
Start: 2024-07-01 — End: 2024-07-01
  Administered 2024-07-01: .3 ug/kg/h via INTRAVENOUS
  Filled 2024-06-28: qty 100

## 2024-06-28 MED ORDER — POTASSIUM CHLORIDE 2 MEQ/ML IV SOLN
80.0000 meq | INTRAVENOUS | Status: DC
  Filled 2024-06-28: qty 40

## 2024-06-28 MED ORDER — EPINEPHRINE HCL 5 MG/250ML IV SOLN IN NS
0.0000 ug/min | INTRAVENOUS | Status: DC
  Filled 2024-06-28: qty 250

## 2024-06-28 MED ORDER — CEFAZOLIN SODIUM-DEXTROSE 2-4 GM/100ML-% IV SOLN
2.0000 g | INTRAVENOUS | Status: AC
  Administered 2024-07-01: 2 g via INTRAVENOUS
  Filled 2024-06-28: qty 100

## 2024-06-28 MED ORDER — MILRINONE LACTATE IN DEXTROSE 20-5 MG/100ML-% IV SOLN
0.3000 ug/kg/min | INTRAVENOUS | Status: DC
  Filled 2024-06-28: qty 100

## 2024-06-28 MED ORDER — TRANEXAMIC ACID 1000 MG/10ML IV SOLN
1.5000 mg/kg/h | INTRAVENOUS | Status: AC
  Administered 2024-07-01: 1.5 mg/kg/h via INTRAVENOUS
  Filled 2024-06-28: qty 25

## 2024-06-28 MED ORDER — CEFAZOLIN SODIUM-DEXTROSE 3-4 GM/150ML-% IV SOLN
3.0000 g | INTRAVENOUS | Status: AC
  Administered 2024-07-01: 3 g via INTRAVENOUS
  Filled 2024-06-28: qty 150

## 2024-06-28 MED ORDER — VANCOMYCIN HCL 1.5 G IV SOLR
1500.0000 mg | INTRAVENOUS | Status: AC
  Administered 2024-07-01: 1500 mg via INTRAVENOUS
  Filled 2024-06-28: qty 30

## 2024-06-28 MED ORDER — TRANEXAMIC ACID (OHS) PUMP PRIME SOLUTION
2.0000 mg/kg | INTRAVENOUS | Status: DC
Start: 2024-07-01 — End: 2024-07-01
  Filled 2024-06-28: qty 2.42

## 2024-06-28 NOTE — Progress Notes (Signed)
 PCP - Grooms, [place], PA-C  Cardiologist - Vishnu P Mallipeddi, MD   PPM/ICD - denies Device Orders - n/a Rep Notified - n/a  Chest x-ray - 1024-25 EKG - 06-28-24 Stress Test - denies ECHO - 05-02-24 Cardiac Cath - 06-06-24  Sleep Study - denies CPAP - n/a  Fasting Blood Sugar - per patient blood sugars ranges 150-200. Blood sugar this am prior to PAT per patient 170. Blood sugar at PAT 170 Checks Blood Sugar daily A1c on7-29-25 7.4 metFORMIN  (GLUCOPHAGE -XR) Last dose 06-24-24 LANTUS  Last dose 06-24-24. Patient given instruction regarding insulin  dose prior to procedure. States I am not taking it  Last dose of GLP1 agonist-  denies GLP1 instructions: denies  Blood Thinner Instructions: warfarin Last (COUMADIN ) LAST DOSE: 06-27-24 Aspirin  Instructions: denies  ERAS Protcol - NPO   COVID TEST- n/a   Anesthesia review: yes  Patient denies shortness of breath, fever, cough and chest pain at PAT appointment   All instructions explained to the patient, with a verbal understanding of the material. Patient agrees to go over the instructions while at home for a better understanding. Patient also instructed to self quarantine after being tested for COVID-19. The opportunity to ask questions was provided.

## 2024-07-01 ENCOUNTER — Inpatient Hospital Stay (HOSPITAL_COMMUNITY)
Admission: RE | Admit: 2024-07-01 | Discharge: 2024-07-06 | DRG: 220 | Disposition: A | Discharge status: Home / Self Care | Attending: Thoracic Surgery (Cardiothoracic Vascular Surgery) | Admitting: Thoracic Surgery (Cardiothoracic Vascular Surgery)

## 2024-07-01 ENCOUNTER — Encounter (HOSPITAL_COMMUNITY)
Admission: RE | Disposition: A | Discharge status: Home / Self Care | Payer: Self-pay | Source: Home / Self Care | Attending: Thoracic Surgery (Cardiothoracic Vascular Surgery)

## 2024-07-01 ENCOUNTER — Inpatient Hospital Stay (HOSPITAL_COMMUNITY): Payer: Self-pay | Admitting: Certified Registered Nurse Anesthetist

## 2024-07-01 ENCOUNTER — Encounter (HOSPITAL_COMMUNITY): Payer: Self-pay | Admitting: Thoracic Surgery (Cardiothoracic Vascular Surgery)

## 2024-07-01 ENCOUNTER — Inpatient Hospital Stay (HOSPITAL_COMMUNITY)

## 2024-07-01 DIAGNOSIS — Q2381 Bicuspid aortic valve: Secondary | ICD-10-CM

## 2024-07-01 DIAGNOSIS — Z820 Family history of epilepsy and other diseases of the nervous system: Secondary | ICD-10-CM | POA: Diagnosis not present

## 2024-07-01 DIAGNOSIS — I4891 Unspecified atrial fibrillation: Secondary | ICD-10-CM

## 2024-07-01 DIAGNOSIS — Z7984 Long term (current) use of oral hypoglycemic drugs: Secondary | ICD-10-CM

## 2024-07-01 DIAGNOSIS — E119 Type 2 diabetes mellitus without complications: Secondary | ICD-10-CM | POA: Diagnosis present

## 2024-07-01 DIAGNOSIS — I350 Nonrheumatic aortic (valve) stenosis: Secondary | ICD-10-CM | POA: Diagnosis not present

## 2024-07-01 DIAGNOSIS — Z6835 Body mass index (BMI) 35.0-35.9, adult: Secondary | ICD-10-CM | POA: Diagnosis not present

## 2024-07-01 DIAGNOSIS — Z9889 Other specified postprocedural states: Secondary | ICD-10-CM

## 2024-07-01 DIAGNOSIS — I480 Paroxysmal atrial fibrillation: Secondary | ICD-10-CM | POA: Diagnosis present

## 2024-07-01 DIAGNOSIS — I10 Essential (primary) hypertension: Secondary | ICD-10-CM

## 2024-07-01 DIAGNOSIS — D6959 Other secondary thrombocytopenia: Secondary | ICD-10-CM | POA: Diagnosis not present

## 2024-07-01 DIAGNOSIS — Z9911 Dependence on respirator [ventilator] status: Secondary | ICD-10-CM

## 2024-07-01 DIAGNOSIS — E039 Hypothyroidism, unspecified: Secondary | ICD-10-CM | POA: Diagnosis present

## 2024-07-01 DIAGNOSIS — E1165 Type 2 diabetes mellitus with hyperglycemia: Secondary | ICD-10-CM

## 2024-07-01 DIAGNOSIS — Z7901 Long term (current) use of anticoagulants: Secondary | ICD-10-CM | POA: Diagnosis not present

## 2024-07-01 DIAGNOSIS — Z953 Presence of xenogenic heart valve: Secondary | ICD-10-CM

## 2024-07-01 DIAGNOSIS — Z825 Family history of asthma and other chronic lower respiratory diseases: Secondary | ICD-10-CM

## 2024-07-01 DIAGNOSIS — Z952 Presence of prosthetic heart valve: Secondary | ICD-10-CM

## 2024-07-01 DIAGNOSIS — E8770 Fluid overload, unspecified: Secondary | ICD-10-CM | POA: Diagnosis not present

## 2024-07-01 DIAGNOSIS — E871 Hypo-osmolality and hyponatremia: Secondary | ICD-10-CM | POA: Diagnosis present

## 2024-07-01 DIAGNOSIS — Z794 Long term (current) use of insulin: Secondary | ICD-10-CM

## 2024-07-01 DIAGNOSIS — I4892 Unspecified atrial flutter: Secondary | ICD-10-CM | POA: Diagnosis present

## 2024-07-01 HISTORY — PX: TEE WITHOUT CARDIOVERSION: SHX5443

## 2024-07-01 HISTORY — PX: CLIPPING OF ATRIAL APPENDAGE: SHX5773

## 2024-07-01 HISTORY — PX: AORTIC VALVE REPLACEMENT: SHX41

## 2024-07-01 LAB — CBC
HCT: 34.3 % — ABNORMAL LOW (ref 39.0–52.0)
HCT: 37.1 % — ABNORMAL LOW (ref 39.0–52.0)
Hemoglobin: 11.7 g/dL — ABNORMAL LOW (ref 13.0–17.0)
Hemoglobin: 12.6 g/dL — ABNORMAL LOW (ref 13.0–17.0)
MCH: 28 pg (ref 26.0–34.0)
MCH: 27.7 pg (ref 26.0–34.0)
MCHC: 34.1 g/dL (ref 30.0–36.0)
MCHC: 34 g/dL (ref 30.0–36.0)
MCV: 82.1 fL (ref 80.0–100.0)
MCV: 81.5 fL (ref 80.0–100.0)
Platelets: 138 K/uL — ABNORMAL LOW (ref 150–400)
Platelets: 139 K/uL — ABNORMAL LOW (ref 150–400)
RBC: 4.18 MIL/uL — ABNORMAL LOW (ref 4.22–5.81)
RBC: 4.55 MIL/uL (ref 4.22–5.81)
RDW: 13.8 % (ref 11.5–15.5)
RDW: 13.9 % (ref 11.5–15.5)
WBC: 15.6 K/uL — ABNORMAL HIGH (ref 4.0–10.5)
WBC: 11.5 K/uL — ABNORMAL HIGH (ref 4.0–10.5)
nRBC: 0 % (ref 0.0–0.2)
nRBC: 0 % (ref 0.0–0.2)

## 2024-07-01 LAB — POCT I-STAT 7, (LYTES, BLD GAS, ICA,H+H)
Acid-Base Excess: 0 mmol/L (ref 0.0–2.0)
Acid-base deficit: 1 mmol/L (ref 0.0–2.0)
Acid-base deficit: 4 mmol/L — ABNORMAL HIGH (ref 0.0–2.0)
Acid-base deficit: 4 mmol/L — ABNORMAL HIGH (ref 0.0–2.0)
Bicarbonate: 25.2 mmol/L (ref 20.0–28.0)
Bicarbonate: 24.7 mmol/L (ref 20.0–28.0)
Bicarbonate: 20.7 mmol/L (ref 20.0–28.0)
Bicarbonate: 21.5 mmol/L (ref 20.0–28.0)
Calcium, Ion: 1.02 mmol/L — ABNORMAL LOW (ref 1.15–1.40)
Calcium, Ion: 1.11 mmol/L — ABNORMAL LOW (ref 1.15–1.40)
Calcium, Ion: 1.23 mmol/L (ref 1.15–1.40)
Calcium, Ion: 1.22 mmol/L (ref 1.15–1.40)
HCT: 31 % — ABNORMAL LOW (ref 39.0–52.0)
HCT: 33 % — ABNORMAL LOW (ref 39.0–52.0)
HCT: 30 % — ABNORMAL LOW (ref 39.0–52.0)
HCT: 33 % — ABNORMAL LOW (ref 39.0–52.0)
Hemoglobin: 10.5 g/dL — ABNORMAL LOW (ref 13.0–17.0)
Hemoglobin: 11.2 g/dL — ABNORMAL LOW (ref 13.0–17.0)
Hemoglobin: 10.2 g/dL — ABNORMAL LOW (ref 13.0–17.0)
Hemoglobin: 11.2 g/dL — ABNORMAL LOW (ref 13.0–17.0)
O2 Saturation: 100 %
O2 Saturation: 100 %
O2 Saturation: 100 %
O2 Saturation: 99 %
Patient temperature: 37.4
Potassium: 4.2 mmol/L (ref 3.5–5.1)
Potassium: 4.7 mmol/L (ref 3.5–5.1)
Potassium: 4.4 mmol/L (ref 3.5–5.1)
Potassium: 4.2 mmol/L (ref 3.5–5.1)
Sodium: 138 mmol/L (ref 135–145)
Sodium: 137 mmol/L (ref 135–145)
Sodium: 137 mmol/L (ref 135–145)
Sodium: 137 mmol/L (ref 135–145)
TCO2: 27 mmol/L (ref 22–32)
TCO2: 26 mmol/L (ref 22–32)
TCO2: 22 mmol/L (ref 22–32)
TCO2: 23 mmol/L (ref 22–32)
pCO2 arterial: 45.1 mmHg (ref 32–48)
pCO2 arterial: 38 mmHg (ref 32–48)
pCO2 arterial: 34 mmHg (ref 32–48)
pCO2 arterial: 38.9 mmHg (ref 32–48)
pH, Arterial: 7.355 (ref 7.35–7.45)
pH, Arterial: 7.42 (ref 7.35–7.45)
pH, Arterial: 7.394 (ref 7.35–7.45)
pH, Arterial: 7.353 (ref 7.35–7.45)
pO2, Arterial: 289 mmHg — ABNORMAL HIGH (ref 83–108)
pO2, Arterial: 288 mmHg — ABNORMAL HIGH (ref 83–108)
pO2, Arterial: 387 mmHg — ABNORMAL HIGH (ref 83–108)
pO2, Arterial: 138 mmHg — ABNORMAL HIGH (ref 83–108)

## 2024-07-01 LAB — BASIC METABOLIC PANEL WITH GFR
Anion gap: 9 (ref 5–15)
BUN: 11 mg/dL (ref 8–23)
CO2: 20 mmol/L — ABNORMAL LOW (ref 22–32)
Calcium: 7.9 mg/dL — ABNORMAL LOW (ref 8.9–10.3)
Chloride: 109 mmol/L (ref 98–111)
Creatinine, Ser: 1.04 mg/dL (ref 0.61–1.24)
GFR, Estimated: >60 mL/min (ref >60)
Glucose, Bld: 130 mg/dL — ABNORMAL HIGH (ref 70–99)
Potassium: 4.3 mmol/L (ref 3.5–5.1)
Sodium: 138 mmol/L (ref 135–145)

## 2024-07-01 LAB — POCT I-STAT, CHEM 8
BUN: 11 mg/dL (ref 8–23)
BUN: 10 mg/dL (ref 8–23)
BUN: 11 mg/dL (ref 8–23)
BUN: 11 mg/dL (ref 8–23)
Calcium, Ion: 1.23 mmol/L (ref 1.15–1.40)
Calcium, Ion: 1.19 mmol/L (ref 1.15–1.40)
Calcium, Ion: 1.12 mmol/L — ABNORMAL LOW (ref 1.15–1.40)
Calcium, Ion: 1.22 mmol/L (ref 1.15–1.40)
Chloride: 102 mmol/L (ref 98–111)
Chloride: 103 mmol/L (ref 98–111)
Chloride: 103 mmol/L (ref 98–111)
Chloride: 104 mmol/L (ref 98–111)
Creatinine, Ser: 0.9 mg/dL (ref 0.61–1.24)
Creatinine, Ser: 0.9 mg/dL (ref 0.61–1.24)
Creatinine, Ser: 0.8 mg/dL (ref 0.61–1.24)
Creatinine, Ser: 0.9 mg/dL (ref 0.61–1.24)
Glucose, Bld: 170 mg/dL — ABNORMAL HIGH (ref 70–99)
Glucose, Bld: 178 mg/dL — ABNORMAL HIGH (ref 70–99)
Glucose, Bld: 125 mg/dL — ABNORMAL HIGH (ref 70–99)
Glucose, Bld: 126 mg/dL — ABNORMAL HIGH (ref 70–99)
HCT: 40 % (ref 39.0–52.0)
HCT: 38 % — ABNORMAL LOW (ref 39.0–52.0)
HCT: 33 % — ABNORMAL LOW (ref 39.0–52.0)
HCT: 33 % — ABNORMAL LOW (ref 39.0–52.0)
Hemoglobin: 13.6 g/dL (ref 13.0–17.0)
Hemoglobin: 12.9 g/dL — ABNORMAL LOW (ref 13.0–17.0)
Hemoglobin: 11.2 g/dL — ABNORMAL LOW (ref 13.0–17.0)
Hemoglobin: 11.2 g/dL — ABNORMAL LOW (ref 13.0–17.0)
Potassium: 4.1 mmol/L (ref 3.5–5.1)
Potassium: 4.2 mmol/L (ref 3.5–5.1)
Potassium: 4.8 mmol/L (ref 3.5–5.1)
Potassium: 4.5 mmol/L (ref 3.5–5.1)
Sodium: 139 mmol/L (ref 135–145)
Sodium: 138 mmol/L (ref 135–145)
Sodium: 137 mmol/L (ref 135–145)
Sodium: 138 mmol/L (ref 135–145)
TCO2: 24 mmol/L (ref 22–32)
TCO2: 23 mmol/L (ref 22–32)
TCO2: 24 mmol/L (ref 22–32)
TCO2: 23 mmol/L (ref 22–32)

## 2024-07-01 LAB — POCT I-STAT EG7
Acid-base deficit: 2 mmol/L (ref 0.0–2.0)
Bicarbonate: 24 mmol/L (ref 20.0–28.0)
Calcium, Ion: 1.08 mmol/L — ABNORMAL LOW (ref 1.15–1.40)
HCT: 30 % — ABNORMAL LOW (ref 39.0–52.0)
Hemoglobin: 10.2 g/dL — ABNORMAL LOW (ref 13.0–17.0)
O2 Saturation: 80 %
Potassium: 4.8 mmol/L (ref 3.5–5.1)
Sodium: 136 mmol/L (ref 135–145)
TCO2: 25 mmol/L (ref 22–32)
pCO2, Ven: 45 mmHg (ref 44–60)
pH, Ven: 7.335 (ref 7.25–7.43)
pO2, Ven: 48 mmHg — ABNORMAL HIGH (ref 32–45)

## 2024-07-01 LAB — GLUCOSE, CAPILLARY
Glucose-Capillary: 161 mg/dL — ABNORMAL HIGH (ref 70–99)
Glucose-Capillary: 111 mg/dL — ABNORMAL HIGH (ref 70–99)
Glucose-Capillary: 119 mg/dL — ABNORMAL HIGH (ref 70–99)
Glucose-Capillary: 113 mg/dL — ABNORMAL HIGH (ref 70–99)
Glucose-Capillary: 133 mg/dL — ABNORMAL HIGH (ref 70–99)
Glucose-Capillary: 107 mg/dL — ABNORMAL HIGH (ref 70–99)
Glucose-Capillary: 123 mg/dL — ABNORMAL HIGH (ref 70–99)
Glucose-Capillary: 122 mg/dL — ABNORMAL HIGH (ref 70–99)
Glucose-Capillary: 111 mg/dL — ABNORMAL HIGH (ref 70–99)

## 2024-07-01 LAB — PLATELET COUNT: Platelets: 134 K/uL — ABNORMAL LOW (ref 150–400)

## 2024-07-01 LAB — ABO/RH: ABO/RH(D): O NEG

## 2024-07-01 LAB — APTT
aPTT: 29 s (ref 24–36)
aPTT: 36 s (ref 24–36)

## 2024-07-01 LAB — MAGNESIUM: Magnesium: 3 mg/dL — ABNORMAL HIGH (ref 1.7–2.4)

## 2024-07-01 LAB — PROTIME-INR
INR: 1.4 — ABNORMAL HIGH (ref 0.8–1.2)
Prothrombin Time: 17.6 s — ABNORMAL HIGH (ref 11.4–15.2)

## 2024-07-01 LAB — ECHO INTRAOPERATIVE TEE
AV Mean grad: 37 mmHg
AV Peak grad: 59 mmHg
Ao pk vel: 3.84 m/s
Height: 74 in
S' Lateral: 3.4 cm
Weight: 4320 [oz_av]

## 2024-07-01 LAB — CG4 I-STAT (LACTIC ACID)
Lactic Acid, Venous: 1.3 mmol/L (ref 0.5–1.9)
Lactic Acid, Venous: 0.9 mmol/L (ref 0.5–1.9)

## 2024-07-01 LAB — HEMOGLOBIN AND HEMATOCRIT, BLOOD
HCT: 34.5 % — ABNORMAL LOW (ref 39.0–52.0)
Hemoglobin: 11.8 g/dL — ABNORMAL LOW (ref 13.0–17.0)

## 2024-07-01 LAB — FIBRINOGEN: Fibrinogen: 224 mg/dL (ref 210–475)

## 2024-07-01 SURGERY — REPLACEMENT, AORTIC VALVE, OPEN
Anesthesia: General | Site: Chest

## 2024-07-01 MED ORDER — NICARDIPINE HCL IN NACL 20-0.86 MG/200ML-% IV SOLN: 0.0000 mg/h | INTRAVENOUS | Status: DC

## 2024-07-01 MED ORDER — OXYCODONE HCL 5 MG PO TABS
5.0000 mg | ORAL_TABLET | ORAL | Status: DC | PRN
  Administered 2024-07-01: 10 mg via ORAL
  Administered 2024-07-01: 5 mg via ORAL
  Administered 2024-07-02: 10 mg via ORAL
  Filled 2024-07-01: qty 2
  Filled 2024-07-01: qty 1
  Filled 2024-07-01: qty 2

## 2024-07-01 MED ORDER — ASPIRIN 81 MG PO CHEW
324.0000 mg | CHEWABLE_TABLET | Freq: Once | ORAL | Status: AC
  Administered 2024-07-01: 324 mg via ORAL
  Filled 2024-07-01: qty 4

## 2024-07-01 MED ORDER — NOREPINEPHRINE 4 MG/250ML-% IV SOLN
0.0000 ug/min | INTRAVENOUS | Status: DC
Start: 2024-07-01 — End: 2024-07-03
  Administered 2024-07-01: 2 ug/min via INTRAVENOUS
  Filled 2024-07-01: qty 250

## 2024-07-01 MED ORDER — DOCUSATE SODIUM 100 MG PO CAPS
200.0000 mg | ORAL_CAPSULE | Freq: Every day | ORAL | Status: DC
  Administered 2024-07-02: 200 mg via ORAL
  Filled 2024-07-01 (×2): qty 2

## 2024-07-01 MED ORDER — ORAL CARE MOUTH RINSE: 15.0000 mL | OROMUCOSAL | Status: DC | PRN

## 2024-07-01 MED ORDER — HEPARIN SODIUM (PORCINE) 1000 UNIT/ML IJ SOLN
INTRAMUSCULAR | Status: AC
  Filled 2024-07-01: qty 2

## 2024-07-01 MED ORDER — CHLORHEXIDINE GLUCONATE CLOTH 2 % EX PADS
6.0000 | MEDICATED_PAD | Freq: Every day | CUTANEOUS | Status: DC
  Administered 2024-07-03 – 2024-07-06 (×4): 6 via TOPICAL

## 2024-07-01 MED ORDER — FENTANYL CITRATE (PF) 100 MCG/2ML IJ SOLN
INTRAMUSCULAR | Status: DC | PRN
  Administered 2024-07-01: 150 ug via INTRAVENOUS
  Administered 2024-07-01: 50 ug via INTRAVENOUS
  Administered 2024-07-01: 200 ug via INTRAVENOUS
  Administered 2024-07-01 (×2): 100 ug via INTRAVENOUS
  Administered 2024-07-01 (×3): 50 ug via INTRAVENOUS
  Administered 2024-07-01: 150 ug via INTRAVENOUS
  Administered 2024-07-01: 50 ug via INTRAVENOUS

## 2024-07-01 MED ORDER — MIDAZOLAM HCL (PF) 10 MG/2ML IJ SOLN
INTRAMUSCULAR | Status: AC
  Filled 2024-07-01: qty 2

## 2024-07-01 MED ORDER — SODIUM CHLORIDE 0.9% FLUSH
3.0000 mL | Freq: Two times a day (BID) | INTRAVENOUS | Status: DC
  Administered 2024-07-02 – 2024-07-03 (×4): 3 mL via INTRAVENOUS

## 2024-07-01 MED ORDER — LACTATED RINGERS IV SOLN: INTRAVENOUS | Status: DC | PRN

## 2024-07-01 MED ORDER — SODIUM CHLORIDE 0.9 % IV SOLN: INTRAVENOUS | Status: AC

## 2024-07-01 MED ORDER — PHENYLEPHRINE HCL (PRESSORS) 10 MG/ML IV SOLN
INTRAVENOUS | Status: DC | PRN
  Administered 2024-07-01: 40 ug via INTRAVENOUS
  Administered 2024-07-01: 80 ug via INTRAVENOUS
  Administered 2024-07-01: 160 ug via INTRAVENOUS

## 2024-07-01 MED ORDER — FENTANYL CITRATE (PF) 250 MCG/5ML IJ SOLN
INTRAMUSCULAR | Status: AC
  Filled 2024-07-01: qty 5

## 2024-07-01 MED ORDER — PROTAMINE SULFATE 10 MG/ML IV SOLN
INTRAVENOUS | Status: AC
  Filled 2024-07-01: qty 50

## 2024-07-01 MED ORDER — METOPROLOL TARTRATE 5 MG/5ML IV SOLN
2.5000 mg | INTRAVENOUS | Status: DC | PRN
  Administered 2024-07-03: 2.5 mg via INTRAVENOUS
  Filled 2024-07-01: qty 5

## 2024-07-01 MED ORDER — PROPOFOL 10 MG/ML IV BOLUS
INTRAVENOUS | Status: AC
Start: 2024-07-01 — End: 2024-07-01
  Filled 2024-07-01: qty 20

## 2024-07-01 MED ORDER — LACTATED RINGERS IV SOLN: INTRAVENOUS | Status: AC

## 2024-07-01 MED ORDER — CEFAZOLIN SODIUM-DEXTROSE 2-4 GM/100ML-% IV SOLN: 2.0000 g | Freq: Three times a day (TID) | INTRAVENOUS | Status: DC

## 2024-07-01 MED ORDER — SODIUM CHLORIDE (PF) 0.9 % IJ SOLN
OROMUCOSAL | Status: DC | PRN
  Administered 2024-07-01 (×2): 4 mL via TOPICAL

## 2024-07-01 MED ORDER — SODIUM CHLORIDE 0.9 % IV SOLN
INTRAVENOUS | Status: DC | PRN
  Administered 2024-07-01: 500 mL via INTRAMUSCULAR

## 2024-07-01 MED ORDER — SUGAMMADEX SODIUM 200 MG/2ML IV SOLN
INTRAVENOUS | Status: DC | PRN
Start: 2024-07-01 — End: 2024-07-01
  Administered 2024-07-01: 100 mg via INTRAVENOUS

## 2024-07-01 MED ORDER — CLEVIDIPINE BUTYRATE 0.5 MG/ML IV EMUL
INTRAVENOUS | Status: AC
  Filled 2024-07-01: qty 50

## 2024-07-01 MED ORDER — PROTAMINE SULFATE 10 MG/ML IV SOLN
INTRAVENOUS | Status: DC | PRN
  Administered 2024-07-01: 400 mg via INTRAVENOUS

## 2024-07-01 MED ORDER — LACTATED RINGERS IV SOLN: INTRAVENOUS | Status: DC

## 2024-07-01 MED ORDER — ~~LOC~~ CARDIAC SURGERY, PATIENT & FAMILY EDUCATION
Freq: Once | Status: DC
  Filled 2024-07-01: qty 1

## 2024-07-01 MED ORDER — SODIUM CHLORIDE 0.9% FLUSH
10.0000 mL | Freq: Two times a day (BID) | INTRAVENOUS | Status: DC
  Administered 2024-07-01: 20 mL
  Administered 2024-07-01 – 2024-07-03 (×5): 10 mL

## 2024-07-01 MED ORDER — PANTOPRAZOLE SODIUM 40 MG IV SOLR
40.0000 mg | Freq: Every day | INTRAVENOUS | Status: AC
  Administered 2024-07-01 – 2024-07-02 (×2): 40 mg via INTRAVENOUS
  Filled 2024-07-01 (×2): qty 10

## 2024-07-01 MED ORDER — DEXMEDETOMIDINE HCL IN NACL 400 MCG/100ML IV SOLN: 0.0000 ug/kg/h | INTRAVENOUS | Status: DC

## 2024-07-01 MED ORDER — MIDAZOLAM HCL (PF) 5 MG/ML IJ SOLN
INTRAMUSCULAR | Status: DC | PRN
  Administered 2024-07-01 (×2): 1 mg via INTRAVENOUS
  Administered 2024-07-01: 3 mg via INTRAVENOUS

## 2024-07-01 MED ORDER — CEFAZOLIN SODIUM-DEXTROSE 3-4 GM/150ML-% IV SOLN
3.0000 g | Freq: Three times a day (TID) | INTRAVENOUS | Status: AC
  Administered 2024-07-01 – 2024-07-03 (×6): 3 g via INTRAVENOUS
  Filled 2024-07-01 (×7): qty 150

## 2024-07-01 MED ORDER — BISACODYL 5 MG PO TBEC
10.0000 mg | DELAYED_RELEASE_TABLET | Freq: Every day | ORAL | Status: DC
  Administered 2024-07-02: 10 mg via ORAL
  Filled 2024-07-01: qty 2

## 2024-07-01 MED ORDER — ORAL CARE MOUTH RINSE
15.0000 mL | OROMUCOSAL | Status: DC
  Administered 2024-07-01: 15 mL via OROMUCOSAL

## 2024-07-01 MED ORDER — ACETAMINOPHEN 325 MG PO TABS
650.0000 mg | ORAL_TABLET | Freq: Once | ORAL | Status: AC
  Administered 2024-07-01: 650 mg via ORAL
  Filled 2024-07-01: qty 2

## 2024-07-01 MED ORDER — VANCOMYCIN HCL 1500 MG/300ML IV SOLN
1500.0000 mg | Freq: Once | INTRAVENOUS | Status: AC
  Administered 2024-07-01: 1500 mg via INTRAVENOUS
  Filled 2024-07-01: qty 300

## 2024-07-01 MED ORDER — HEPARIN SODIUM (PORCINE) 1000 UNIT/ML IJ SOLN
INTRAMUSCULAR | Status: DC | PRN
  Administered 2024-07-01: 40000 [IU] via INTRAVENOUS

## 2024-07-01 MED ORDER — SODIUM CHLORIDE 0.9% FLUSH: 10.0000 mL | INTRAVENOUS | Status: DC | PRN

## 2024-07-01 MED ORDER — METOCLOPRAMIDE HCL 5 MG/ML IJ SOLN
10.0000 mg | Freq: Four times a day (QID) | INTRAMUSCULAR | Status: AC
  Administered 2024-07-01 – 2024-07-02 (×6): 10 mg via INTRAVENOUS
  Filled 2024-07-01 (×6): qty 2

## 2024-07-01 MED ORDER — SODIUM CHLORIDE 0.9 % IV SOLN
INTRAVENOUS | Status: DC | PRN
Start: 2024-07-01 — End: 2024-07-01

## 2024-07-01 MED ORDER — ROCURONIUM BROMIDE 100 MG/10ML IV SOLN
INTRAVENOUS | Status: DC | PRN
  Administered 2024-07-01: 50 mg via INTRAVENOUS
  Administered 2024-07-01: 30 mg via INTRAVENOUS
  Administered 2024-07-01: 20 mg via INTRAVENOUS
  Administered 2024-07-01: 100 mg via INTRAVENOUS

## 2024-07-01 MED ORDER — ACETAMINOPHEN 160 MG/5ML PO SOLN: 650.0000 mg | Freq: Once | ORAL | Status: DC

## 2024-07-01 MED ORDER — ACETAMINOPHEN 160 MG/5ML PO SOLN: 1000.0000 mg | Freq: Four times a day (QID) | ORAL | Status: DC

## 2024-07-01 MED ORDER — ASPIRIN 81 MG PO CHEW: 324.0000 mg | CHEWABLE_TABLET | Freq: Every day | ORAL | Status: DC

## 2024-07-01 MED ORDER — ALBUMIN HUMAN 5 % IV SOLN
250.0000 mL | INTRAVENOUS | Status: DC | PRN
  Administered 2024-07-01 (×2): 12.5 g via INTRAVENOUS

## 2024-07-01 MED ORDER — METOPROLOL TARTRATE 12.5 MG HALF TABLET
12.5000 mg | ORAL_TABLET | Freq: Once | ORAL | Status: AC
  Administered 2024-07-01: 12.5 mg via ORAL
  Filled 2024-07-01: qty 1

## 2024-07-01 MED ORDER — PANTOPRAZOLE SODIUM 40 MG PO TBEC
40.0000 mg | DELAYED_RELEASE_TABLET | Freq: Every day | ORAL | Status: DC
  Administered 2024-07-03 – 2024-07-06 (×4): 40 mg via ORAL
  Filled 2024-07-01 (×4): qty 1

## 2024-07-01 MED ORDER — CHLORHEXIDINE GLUCONATE 0.12 % MT SOLN
15.0000 mL | OROMUCOSAL | Status: AC
  Administered 2024-07-01: 15 mL via OROMUCOSAL
  Filled 2024-07-01: qty 15

## 2024-07-01 MED ORDER — MAGNESIUM SULFATE 4 GM/100ML IV SOLN
4.0000 g | Freq: Once | INTRAVENOUS | Status: AC
  Administered 2024-07-01: 4 g via INTRAVENOUS
  Filled 2024-07-01: qty 100

## 2024-07-01 MED ORDER — ASPIRIN 325 MG PO TBEC
325.0000 mg | DELAYED_RELEASE_TABLET | Freq: Every day | ORAL | Status: DC
  Administered 2024-07-02 – 2024-07-06 (×5): 325 mg via ORAL
  Filled 2024-07-01 (×5): qty 1

## 2024-07-01 MED ORDER — VANCOMYCIN HCL IN DEXTROSE 1-5 GM/200ML-% IV SOLN: 1000.0000 mg | Freq: Once | INTRAVENOUS | Status: DC

## 2024-07-01 MED ORDER — MORPHINE SULFATE (PF) 2 MG/ML IV SOLN
1.0000 mg | INTRAVENOUS | Status: DC | PRN
  Administered 2024-07-01: 2 mg via INTRAVENOUS
  Filled 2024-07-01: qty 1

## 2024-07-01 MED ORDER — INSULIN REGULAR(HUMAN) IN NACL 100-0.9 UT/100ML-% IV SOLN: INTRAVENOUS | Status: AC

## 2024-07-01 MED ORDER — TRAMADOL HCL 50 MG PO TABS
50.0000 mg | ORAL_TABLET | ORAL | Status: DC | PRN
  Administered 2024-07-02 – 2024-07-06 (×2): 100 mg via ORAL
  Filled 2024-07-01 (×2): qty 2

## 2024-07-01 MED ORDER — DEXTROSE 50 % IV SOLN: 0.0000 mL | INTRAVENOUS | Status: DC | PRN

## 2024-07-01 MED ORDER — CHLORHEXIDINE GLUCONATE CLOTH 2 % EX PADS
6.0000 | MEDICATED_PAD | Freq: Every day | CUTANEOUS | Status: DC
  Administered 2024-07-01 – 2024-07-03 (×3): 6 via TOPICAL

## 2024-07-01 MED ORDER — SODIUM CHLORIDE 0.9% FLUSH: 3.0000 mL | INTRAVENOUS | Status: DC | PRN

## 2024-07-01 MED ORDER — METOPROLOL TARTRATE 12.5 MG HALF TABLET
12.5000 mg | ORAL_TABLET | Freq: Two times a day (BID) | ORAL | Status: DC
  Administered 2024-07-02 – 2024-07-06 (×9): 12.5 mg via ORAL
  Filled 2024-07-01 (×9): qty 1

## 2024-07-01 MED ORDER — POTASSIUM CHLORIDE 10 MEQ/50ML IV SOLN: 10.0000 meq | INTRAVENOUS | Status: AC

## 2024-07-01 MED ORDER — SODIUM CHLORIDE 0.9 % IV SOLN: 250.0000 mL | INTRAVENOUS | Status: AC

## 2024-07-01 MED ORDER — ALBUMIN HUMAN 5 % IV SOLN: INTRAVENOUS | Status: DC | PRN

## 2024-07-01 MED ORDER — ONDANSETRON HCL 4 MG/2ML IJ SOLN: 4.0000 mg | Freq: Four times a day (QID) | INTRAMUSCULAR | Status: DC | PRN

## 2024-07-01 MED ORDER — 0.9 % SODIUM CHLORIDE (POUR BTL) OPTIME
TOPICAL | Status: DC | PRN
  Administered 2024-07-01: 5000 mL

## 2024-07-01 MED ORDER — CHLORHEXIDINE GLUCONATE CLOTH 2 % EX PADS
6.0000 | MEDICATED_PAD | Freq: Every day | CUTANEOUS | Status: DC
  Administered 2024-07-03: 6 via TOPICAL

## 2024-07-01 MED ORDER — CHLORHEXIDINE GLUCONATE 4 % EX SOLN: 30.0000 mL | CUTANEOUS | Status: DC

## 2024-07-01 MED ORDER — DEXMEDETOMIDINE HCL IN NACL 400 MCG/100ML IV SOLN
INTRAVENOUS | Status: AC
  Filled 2024-07-01: qty 100

## 2024-07-01 MED ORDER — BISACODYL 10 MG RE SUPP: 10.0000 mg | Freq: Every day | RECTAL | Status: DC

## 2024-07-01 MED ORDER — ACETAMINOPHEN 500 MG PO TABS
1000.0000 mg | ORAL_TABLET | Freq: Four times a day (QID) | ORAL | Status: DC
  Administered 2024-07-01 – 2024-07-06 (×16): 1000 mg via ORAL
  Filled 2024-07-01 (×16): qty 2

## 2024-07-01 MED ORDER — CHLORHEXIDINE GLUCONATE 0.12 % MT SOLN
15.0000 mL | Freq: Once | OROMUCOSAL | Status: AC
  Administered 2024-07-01: 15 mL via OROMUCOSAL
  Filled 2024-07-01: qty 15

## 2024-07-01 MED ORDER — PROPOFOL 10 MG/ML IV BOLUS
INTRAVENOUS | Status: DC | PRN
  Administered 2024-07-01: 50 mg via INTRAVENOUS

## 2024-07-01 MED ORDER — MIDAZOLAM HCL (PF) 2 MG/2ML IJ SOLN: 2.0000 mg | INTRAMUSCULAR | Status: DC | PRN

## 2024-07-01 MED ORDER — METOPROLOL TARTRATE 25 MG/10 ML ORAL SUSPENSION: 12.5000 mg | Freq: Two times a day (BID) | ORAL | Status: DC

## 2024-07-01 MED ORDER — SODIUM CHLORIDE 0.45 % IV SOLN
INTRAVENOUS | Status: AC | PRN
Start: 2024-07-01 — End: 2024-07-02

## 2024-07-01 SURGICAL SUPPLY — 64 items
BAG DECANTER FOR FLEXI CONT (MISCELLANEOUS) ×3 IMPLANT
BLADE CLIPPER SURG (BLADE) ×3 IMPLANT
BLADE STERNUM SYSTEM 6 (BLADE) ×3 IMPLANT
BLADE SURG 15 STRL LF DISP TIS (BLADE) ×3 IMPLANT
CANISTER SUCTION 3000ML PPV (SUCTIONS) ×3 IMPLANT
CANNULA GUNDRY RCSP 15FR (MISCELLANEOUS) ×3 IMPLANT
CANNULA MC2 2 STG 32/40 NON-V (CANNULA) IMPLANT
CANNULA NON VENT 20FR 12 (CANNULA) ×3 IMPLANT
CANNULA NON VENT 22FR 12 (CANNULA) IMPLANT
CANNULA SUMP PERICARDIAL (CANNULA) IMPLANT
CATH HEART VENT LEFT (CATHETERS) ×3 IMPLANT
CATH ROBINSON RED A/P 18FR (CATHETERS) ×9 IMPLANT
CNTNR URN SCR LID CUP LEK RST (MISCELLANEOUS) ×3 IMPLANT
CONTAINER PROTECT SURGISLUSH (MISCELLANEOUS) ×6 IMPLANT
COVER SURGICAL LIGHT HANDLE (MISCELLANEOUS) ×3 IMPLANT
DEVICE ATRICLIP LAA PRCLPII 45 (Clip) IMPLANT
DEVICE SUT CK QUICK LOAD MINI (Prosthesis & Implant Heart) IMPLANT
DRAIN CHANNEL 19F RND (DRAIN) ×3 IMPLANT
DRAIN CONNECTOR BLAKE 1:1 (MISCELLANEOUS) IMPLANT
DRAPE SRG 135X102X78XABS (DRAPES) ×3 IMPLANT
DRAPE WARM FLUID 44X44 (DRAPES) ×3 IMPLANT
DRESSING AQUACEL AG SP 3.5X10 (GAUZE/BANDAGES/DRESSINGS) IMPLANT
ELECT CAUTERY BLADE 6.4 (BLADE) ×3 IMPLANT
ELECTRODE REM PT RTRN 9FT ADLT (ELECTROSURGICAL) ×6 IMPLANT
ELECTRODE SOLI GEL RDN PROPADZ (MISCELLANEOUS) ×3 IMPLANT
FELT TEFLON 1X6 (MISCELLANEOUS) ×6 IMPLANT
GAUZE SPONGE 4X4 12PLY STRL (GAUZE/BANDAGES/DRESSINGS) ×3 IMPLANT
GAUZE SPONGE 4X4 12PLY STRL LF (GAUZE/BANDAGES/DRESSINGS) IMPLANT
GLOVE BIO SURGEON STRL SZ 6 (GLOVE) IMPLANT
GLOVE BIO SURGEON STRL SZ 6.5 (GLOVE) IMPLANT
GLOVE BIO SURGEON STRL SZ7 (GLOVE) IMPLANT
GLOVE BIO SURGEON STRL SZ7.5 (GLOVE) ×6 IMPLANT
GOWN STRL REUS W/ TWL LRG LVL3 (GOWN DISPOSABLE) ×12 IMPLANT
GOWN STRL REUS W/ TWL XL LVL3 (GOWN DISPOSABLE) ×6 IMPLANT
GOWN STRL SURGICAL XL XLNG (GOWN DISPOSABLE) ×3 IMPLANT
HEMOSTAT POWDER SURGIFOAM 1G (HEMOSTASIS) ×6 IMPLANT
INSERT SUTURE HOLDER (MISCELLANEOUS) ×3 IMPLANT
KIT BASIN OR (CUSTOM PROCEDURE TRAY) ×3 IMPLANT
KIT SUT CK MINI COMBO 4X17 (Prosthesis & Implant Heart) IMPLANT
KIT TURNOVER KIT B (KITS) ×3 IMPLANT
LINE VENT (MISCELLANEOUS) IMPLANT
PACK E OPEN HEART (SUTURE) ×3 IMPLANT
PACK OPEN HEART (CUSTOM PROCEDURE TRAY) ×3 IMPLANT
PAD ARMBOARD POSITIONER FOAM (MISCELLANEOUS) ×6 IMPLANT
POSITIONER HEAD DONUT 9IN (MISCELLANEOUS) ×3 IMPLANT
SET MPS 3-ND DEL (MISCELLANEOUS) IMPLANT
SOLN 0.9% NACL POUR BTL 1000ML (IV SOLUTION) ×18 IMPLANT
SOLN STERILE WATER BTL 1000 ML (IV SOLUTION) ×6 IMPLANT
SUT EB EXC GRN/WHT 2-0 V-5 (SUTURE) ×6 IMPLANT
SUT PROLENE 3 0 SH DA (SUTURE) IMPLANT
SUT PROLENE 4 0 SH DA (SUTURE) ×3 IMPLANT
SUT PROLENE 4-0 RB1 .5 CRCL 36 (SUTURE) ×9 IMPLANT
SUT SILK 1 MH (SUTURE) IMPLANT
SUT STEEL 6MS V (SUTURE) IMPLANT
SUT STEEL STERNAL CCS#1 18IN (SUTURE) IMPLANT
SUT VIC AB 1 CTX36XBRD ANBCTR (SUTURE) IMPLANT
SYSTEM SAHARA CHEST DRAIN ATS (WOUND CARE) ×3 IMPLANT
TAPE CLOTH 4X10 WHT NS (GAUZE/BANDAGES/DRESSINGS) IMPLANT
TAPE PAPER 2X10 WHT MICROPORE (GAUZE/BANDAGES/DRESSINGS) IMPLANT
TOWEL GREEN STERILE (TOWEL DISPOSABLE) ×3 IMPLANT
TOWEL GREEN STERILE FF (TOWEL DISPOSABLE) ×3 IMPLANT
TRAY FOLEY SLVR 16FR TEMP STAT (SET/KITS/TRAYS/PACK) ×3 IMPLANT
UNDERPAD 30X36 HEAVY ABSORB (UNDERPADS AND DIAPERS) ×3 IMPLANT
VALVE AORTIC SZ25 INSP/RESIL (Prosthesis & Implant Heart) IMPLANT

## 2024-07-01 NOTE — Transfer of Care (Signed)
 Immediate Anesthesia Transfer of Care Note  Patient: Johnathan Richmond  Procedure(s) Performed: REPLACEMENT, AORTIC VALVE, OPEN WITH INSPIRIS VALVE SIZE (Chest) ECHOCARDIOGRAM, TRANSESOPHAGEAL CLIPPING, LEFT ATRIAL APPENDAGE USING  ATRICURE CLIP SIZE 45  Patient Location: ICU  Anesthesia Type:General  Level of Consciousness: sedated and Patient remains intubated per anesthesia plan  Airway & Oxygen Therapy: Patient remains intubated per anesthesia plan and Patient placed on Ventilator (see vital sign flow sheet for setting)  Post-op Assessment: Report given to RN and Post -op Vital signs reviewed and stable  Post vital signs: Reviewed and stable  Last Vitals:  Vitals Value Taken Time  BP 126/67   Temp    Pulse 57   Resp 14   SpO2 100     Last Pain:  Vitals:   07/01/24 0547  TempSrc: Oral         Complications: No notable events documented. Patient transported to ICU with standard monitors (HR, BP, SPO2, RR) and emergency drugs/equipment. Controlled ventilation maintained via ambu bag. Report given to bedside RN and respiratory therapist. Pt connected to ICU monitor and ventilator. All questions answered and vital signs stable before leaving

## 2024-07-01 NOTE — Anesthesia Procedure Notes (Signed)
 Procedure Name: Intubation Date/Time: 07/01/2024 7:50 AM  Performed by: Lamar Lucie DASEN, CRNAPre-anesthesia Checklist: Patient identified, Emergency Drugs available, Suction available and Patient being monitored Patient Re-evaluated:Patient Re-evaluated prior to induction Oxygen Delivery Method: Circle system utilized Preoxygenation: Pre-oxygenation with 100% oxygen Induction Type: IV induction Ventilation: Mask ventilation without difficulty and Oral airway inserted - appropriate to patient size Laryngoscope Size: Mac and 4 Grade View: Grade I Tube type: Oral Tube size: 8.0 mm Number of attempts: 1 Airway Equipment and Method: Stylet and Oral airway Placement Confirmation: ETT inserted through vocal cords under direct vision, positive ETCO2 and breath sounds checked- equal and bilateral Secured at: 23 cm Tube secured with: Tape Dental Injury: Teeth and Oropharynx as per pre-operative assessment

## 2024-07-01 NOTE — Interval H&P Note (Signed)
 History and Physical Interval Note:  07/01/2024 7:30 AM  Johnathan Richmond  has presented today for surgery, with the diagnosis of aortic valve stenosis.  The various methods of treatment have been discussed with the patient and family. After consideration of risks, benefits and other options for treatment, the patient has consented to  Procedure(s): REPLACEMENT, AORTIC VALVE, OPEN (N/A) ECHOCARDIOGRAM, TRANSESOPHAGEAL (N/A) CLIPPING, LEFT ATRIAL APPENDAGE (N/A) as a surgical intervention.  The patient's history has been reviewed, patient examined, no change in status, stable for surgery.  I have reviewed the patient's chart and labs.  Questions were answered to the patient's satisfaction.     Suzzane Quilter MALVA Rayas

## 2024-07-01 NOTE — Procedures (Signed)
 Extubation Procedure Note  Patient Details:   Name: Johnathan Richmond DOB: Sep 16, 1960 MRN: [id number]   Airway Documentation:    Vent end date: 07/01/24 Vent end time: 1745   Evaluation  O2 sats: stable throughout Complications: No apparent complications Patient did tolerate procedure well. Bilateral Breath Sounds: Clear, Diminished   Yes  Positive cuff leak noted. Patient placed on [place] 4L with humidity, no stridor noted. Patient able to reach 625 mL using the incentive spirometer.  Cy GORMAN Pan 07/01/2024, 5:57 PM

## 2024-07-01 NOTE — Anesthesia Procedure Notes (Signed)
 Central Venous Catheter Insertion Performed by: Patrisha Bernardino SQUIBB, MD, anesthesiologist Start/End10/27/2025 7:00 AM, 07/01/2024 7:15 AM Patient location: Pre-op. Preanesthetic checklist: patient identified, IV checked, site marked, risks and benefits discussed, surgical consent, monitors and equipment checked, pre-op evaluation, timeout performed and anesthesia consent Position: Trendelenburg Lidocaine 1% used for infiltration and patient sedated Hand hygiene performed  and maximum sterile barriers used  Catheter size: 9 Fr Total catheter length 12. MAC introducer Procedure performed using ultrasound to evaluate access site. Ultrasound Notes:relevant anatomy identified, ultrasound used to visualize needle entry, vessel patent under ultrasound and image(s) printed for medical record. Attempts: 1 Following insertion, line sutured and dressing applied. Post procedure assessment: blood return through all ports, free fluid flow and no air  Patient tolerated the procedure well with no immediate complications. Additional procedure comments: Arrow multi-access catheter placed into pulmonary artery port.SABRA

## 2024-07-01 NOTE — Brief Op Note (Signed)
 07/01/2024  12:50 PM  PATIENT:  Johnathan Richmond  63 y.o. male  PRE-OPERATIVE DIAGNOSIS:  AORTIC VALVE STENOSIS  POST-OPERATIVE DIAGNOSIS:  AORTIC VALVE STENOSIS  PROCEDURE:  Procedure(s): REPLACEMENT, AORTIC VALVE, OPEN WITH INSPIRIS VALVE SIZE (N/A) ECHOCARDIOGRAM, TRANSESOPHAGEAL (N/A) CLIPPING, LEFT ATRIAL APPENDAGE USING  ATRICURE CLIP SIZE 45 (N/A)  SURGEON:  Surgeons and Role:    * Lightfoot, Linnie KIDD, MD - Primary  PHYSICIAN ASSISTANT: Shana Zavaleta PA-C  ASSISTANTS: LUKE STACKS RNFA   ANESTHESIA:   general  EBL:  {None/Minimal: 21241}   BLOOD ADMINISTERED:none  DRAINS: MEDIASTINAL CHEST TUBES   LOCAL MEDICATIONS USED:  NONE  SPECIMEN:  Source of Specimen:  AORTIC VALVE LEAFLETS  DISPOSITION OF SPECIMEN:  PATHOLOGY  COUNTS:  YES  TOURNIQUET:  * No tourniquets in log *  DICTATION: .Dragon Dictation  PLAN OF CARE: Admit to inpatient   PATIENT DISPOSITION:  ICU - intubated and hemodynamically stable.   Delay start of Pharmacological VTE agent (>24hrs) due to surgical blood loss or risk of bleeding: yes  COMPLICATIONS : NO KNOWN

## 2024-07-01 NOTE — Anesthesia Postprocedure Evaluation (Signed)
 Anesthesia Post Note  Patient: Johnathan Richmond  Procedure(s) Performed: REPLACEMENT, AORTIC VALVE, OPEN WITH INSPIRIS VALVE SIZE (Chest) ECHOCARDIOGRAM, TRANSESOPHAGEAL CLIPPING, LEFT ATRIAL APPENDAGE USING  ATRICURE CLIP SIZE 45     Patient location during evaluation: ICU Anesthesia Type: General Level of consciousness: sedated Pain management: pain level controlled Vital Signs Assessment: post-procedure vital signs reviewed and stable Respiratory status: patient remains intubated per anesthesia plan Cardiovascular status: stable Postop Assessment: no apparent nausea or vomiting Anesthetic complications: no   No notable events documented.  Last Vitals:  Vitals:   07/01/24 0734 07/01/24 0735  BP:    Pulse: 64 67  Resp:    Temp:    SpO2: 96% 95%    Last Pain:  Vitals:   07/01/24 0547  TempSrc: Oral                 Johnathan Richmond Johnathan Richmond

## 2024-07-01 NOTE — Anesthesia Preprocedure Evaluation (Addendum)
 Anesthesia Evaluation  Patient identified by MRN, date of birth, ID band Patient awake    Reviewed: Allergy & Precautions, NPO status , Patient's Chart, lab work & pertinent test results  Airway Mallampati: II  TM Distance: >3 FB Neck ROM: Full    Dental no notable dental hx.    Pulmonary neg pulmonary ROS   Pulmonary exam normal        Cardiovascular hypertension, Normal cardiovascular exam+ dysrhythmias Atrial Fibrillation + Valvular Problems/Murmurs AS   ECHO: 1. Left ventricular ejection fraction, by estimation, is 65 to 70%. The  left ventricle has normal function. The left ventricle has no regional  wall motion abnormalities. There is moderate concentric left ventricular  hypertrophy. Left ventricular  diastolic parameters are indeterminate.   2. Right ventricular systolic function is normal. The right ventricular  size is normal. There is normal pulmonary artery systolic pressure.   3. The mitral valve is grossly normal. Mild mitral valve regurgitation.   4. The aortic valve has an indeterminant number of cusps, possibly  bicuspid. There is severe calcifcation of the aortic valve. Aortic valve  regurgitation is mild to moderate. Severe aortic valve stenosis. Aortic  valve mean gradient measures 39.5 mmHg.  Aortic valve Vmax measures 4.06 m/s. Dimentionless index 0.24.   5. Aortic dilatation noted. There is mild dilatation of the ascending  aorta, measuring 38 mm.   6. The inferior vena cava is normal in size with greater than 50%  respiratory variability, suggesting right atrial pressure of 3 mmHg.     Neuro/Psych negative neurological ROS  negative psych ROS   GI/Hepatic negative GI ROS, Neg liver ROS,,,  Endo/Other  diabetes, Insulin  Dependent, Oral Hypoglycemic Agents    Renal/GU negative Renal ROS     Musculoskeletal negative musculoskeletal ROS (+)    Abdominal  (+) + obese  Peds  Hematology negative  hematology ROS (+)   Anesthesia Other Findings Severe aortic stenosis  Reproductive/Obstetrics                              Anesthesia Physical Anesthesia Plan  ASA: 4  Anesthesia Plan: General   Post-op Pain Management:    Induction: Intravenous  PONV Risk Score and Plan: 2 and Ondansetron , Dexamethasone , Midazolam and Treatment may vary due to age or medical condition  Airway Management Planned: Oral ETT  Additional Equipment: Arterial line, CVP, TEE and Ultrasound Guidance Line Placement  Intra-op Plan:   Post-operative Plan: Post-operative intubation/ventilation  Informed Consent: I have reviewed the patients History and Physical, chart, labs and discussed the procedure including the risks, benefits and alternatives for the proposed anesthesia with the patient or authorized representative who has indicated his/her understanding and acceptance.     Dental advisory given  Plan Discussed with: CRNA  Anesthesia Plan Comments:          Anesthesia Quick Evaluation

## 2024-07-01 NOTE — Progress Notes (Signed)
 Looks fantastic Hemodynamics stable  F/VT excellent  No inc'd WOB No accessory use Plan Extubate Bipap PRN

## 2024-07-01 NOTE — Op Note (Signed)
 [address]       [name] [address]             [phone]                                           07/01/2024 Patient:  SLAYTER MOORHOUSE Pre-Op Dx: severe aortic stenosis Bicuspid aortic valve History of atrial flutter   Post-op Dx:  same Procedure: Aortic valve replacement with a 25mm Inspiris valve Placement of a 45mm Atriaclip     Surgeon and Role:      * Kieara Schwark, Linnie KIDD, MD - Primary    DEWAINE MICAEL Cera, PA-C  An experienced assistant was required given the complexity of this surgery and the standard of surgical care. The assistant was needed for exposure, dissection, suctioning, retraction of delicate tissues and sutures, instrument exchange and for overall help during this procedure.    Anesthesia  general EBL:  Blood Administration: none Xclamp Time:  85 min Pump Time:   Drains: 55 F blake drain: R, mediastinal  X 2 Wires: ventricular Counts: correct   Indications: 63 y.o. male with a bicuspid aortic valve, and severe aortic stenosis.  He also has a history of paroxysmal atrial flutter, but has been in sinus on most recent EKG.  He does not have any coronary disease, and he has preserved biventricular function.  He does not have an ascending aortic aneurysm.  The risks and benefits of a bioprosthetic aortic valve replacement and atriclip placement were discussed in detail.  The patient is agreeable to proceed.  He will need to hold his coumadin  prior to surgery.   Findings: Bicuspid valve.  Heavily calcified  Operative Technique: All invasive lines were placed in pre-op holding.  After the risks, benefits and alternatives were thoroughly discussed, the patient was brought to the operative theatre.  Anesthesia was induced, and the patient was prepped and draped in normal sterile fashion.  An appropriate surgical pause was performed, and pre-operative antibiotics were dosed accordingly.  We began with an incision over the chest  for the sternotomy.  This was carried down with bovie cautery, and the sternum was divided with a reciprocating saw.  Meticulous hemostasis was obtained.  The patient was systemically heparinized.   The sternal retractor was placed.  The pericardium was divided in the midline and fashioned into a cradle with pericardial stitches.   After we confirmed an appropriate ACT, the ascending aorta was cannulated in standard fashion.  The right atrial appendage was used for venous cannulation site.  Cardiopulmonary bypass was initiated and we began to cool the patient to 32 degrees. The cross clamp was applied, and a dose of anterograde cardioplegia was given with good arrest of the heart.  Our aortotomy was made and directed toward the non coronary cusp.  The valve was inspected.   All leaflets were excised.  The annulus was sized to a 25mm Inspiris valve.  The left ventricle was then copiously irrigated.  Pledgeted mattress sutures were placed circumferentially through the annulus.  These sutures were then passed through the sewing ring of the valve.  Once the valve was seated in the annulus, it was secured with Core-knot sutures.  We began to rewarm, and close our aortotomy in 2 layers.  A re-animation dose of cardioplegia was given.  The left atrial appendage was  sized, and a 45mm atriclip was placed along the base.  After de-airing the heart, the aortic cross clamp was removed.  We checked our valve function, and for air using the TEE.  Once we were satisfying, we separated from cardiopulmonary bypass without event.    The heparin  was reversed with protamine, and hemostasis was obtained.  Chest tubes and wires were placed, and the sternum was re-approximated with with sternal wires.  The soft tissue and skin were re-approximated wth absorbable suture.    The patient tolerated the procedure without any immediate complications, and was transferred to the ICU in guarded condition.  Anamari Galeas MALVA Rayas

## 2024-07-01 NOTE — Progress Notes (Signed)
  TCTS Evening Rounds:   Hemodynamically stable  CI = 2.3  Has started to wake up on vent but sleepy.  Urine output good  CT output low  CBC    Component Value Date/Time   WBC 15.6 (H) 07/01/2024 1318   RBC 4.18 (L) 07/01/2024 1318   HGB 11.7 (L) 07/01/2024 1318   HGB 15.0 05/30/2024 0930   HCT 34.3 (L) 07/01/2024 1318   HCT 44.6 05/30/2024 0930   PLT 138 (L) 07/01/2024 1318   PLT 206 05/30/2024 0930   MCV 82.1 07/01/2024 1318   MCV 85 05/30/2024 0930   MCH 28.0 07/01/2024 1318   MCHC 34.1 07/01/2024 1318   RDW 13.8 07/01/2024 1318   RDW 13.6 05/30/2024 0930   LYMPHSABS 1.4 01/02/2024 1120   MONOABS 0.3 12/30/2021 1404   EOSABS 0.1 01/02/2024 1120   BASOSABS 0.0 01/02/2024 1120     BMET    Component Value Date/Time   NA 137 07/01/2024 1249   NA 141 05/30/2024 0930   K 4.2 07/01/2024 1249   CL 104 07/01/2024 1139   CO2 27 06/28/2024 1047   GLUCOSE 126 (H) 07/01/2024 1139   BUN 11 07/01/2024 1139   BUN 10 05/30/2024 0930   CREATININE 0.90 07/01/2024 1139   CALCIUM 8.9 06/28/2024 1047   EGFR 95 05/30/2024 0930   GFRNONAA >60 06/28/2024 1047     A/P:  Stable postop course. Continue current plans. Extubate when ready. CCM is seeing.

## 2024-07-01 NOTE — Consult Note (Addendum)
 NAME:  Johnathan Richmond, MRN:  [id number], DOB:  August 27, 1961, LOS: 0 ADMISSION DATE:  07/01/2024, CONSULTATION DATE:  10/27 REFERRING MD:  Shyrl, CHIEF COMPLAINT:  post-cardiac surgery ICU care   History of Present Illness:  63 year old male w/ h/o bicuspid aortic valve and severe AS. Initially dx'd as moderate in 2020. Seen in 2025 after being lost to follow-up w/ new onset exertional dyspnea and chest pain in Aug. At this time ECHO showed EF 65-70%, mild MR, prob bicuspid aorta, and severe AS w/ mean gradient 39.5 mmHg. Left heart cath completed 10/2 nml CAs. Presented to [facility] 10/27 for elective AVR w/ left atrial appendage clipping   OR Course  Bypass time: 9:18-11:21 (2hrs74min) Clamp time: [phone] (1hr26 min) Cell saver: 740 EBL 1100 CT output on arrival to ICU: 0   Pertinent  Medical History  AS, PAF, DM, HTN, hypothyroidism, MO Never smoked Significant Hospital Events: Including procedures, antibiotic start and stop dates in addition to other pertinent events   10/27 to OR for CABG   Interim History / Subjective:  Sedated on dex   Objective    Blood pressure 130/78, pulse 67, temperature 98.4 F (36.9 C), temperature source Oral, resp. rate 12, height 6' 2 (1.88 m), weight 122.5 kg, SpO2 95%.        Intake/Output Summary (Last 24 hours) at 07/01/2024 1143 Last data filed at 07/01/2024 1106 Gross per 24 hour  Intake 1150 ml  Output 585 ml  Net 565 ml   Filed Weights   07/01/24 0547  Weight: 122.5 kg    Examination: General: sedated on precedex gtt  HENT: orally intubated. Right internal jugular CVL Lungs: clear CT w/ no sig output Cardiovascular: RRR rate 50s sinus. Sternal dressing intact Abdomen: soft hypoactive  Extremities: warm well perfused  Neuro: sedated  GU: cl yellow   Resolved problem list   Assessment and Plan  S/p AVR w/ left atrial appendage clipping for severe AS (lightfoot 10/27) CI 1.4  Getting albumin; still on low dose  Neo Plan F/u post operative ABG, CXR, coags, fibrinogen, CBC, lactate, chemistry and 12-lead Passive rewarming and initiate precedex wean once normothermic and hemodynamically stable Continue post operative telemetry monitoring for conduction disturbances temporary epicardial pacing wires in place; has back up ppm at bedside  MAP/SBP goal as directed by post-operative orderset w/ PRN pressors, inoptropes and antihypertensive management as indicated.  Ensure euvolemia Close observation for evidence of bleeding, including chest tube output and surgical site,  if CT output exceeds 200 mL/hour for 2 consecutive hours or if there is more than 500 mL in the first hour postoperatively ensure surgical team aware Tight glycemic control Multi-modal analgesia  Initiate post op BB when hemodynamically appropriate (holding for now given bradycardia)  SCDs immediate post-op; w/ DVT prophylaxis vs systemic AC to be initiated post-op day 1 (as directed by surgical team) Initiate post-operative AC once [facility] w/ cardiac surgeon, VKA therapy with an INR target of 2.5 for the first 3-6 months, followed by lifelong aspirin  (75-100 mg daily) Complete prophylactic antibiotics    Post operative ventilator management  Plan Cont full vent support Once normothermic and hemodynamically stable begin dex taper and  initiate CVTS weaning protocol  VAP bundle  PRN ABG  H/u PAF Plan Tele  Rate control  Amiodarone  AC when ok w/ surgical team   Expected post operative anemia  Hgb baseline 16; EBL: 11000 got 740 cell saver istat post op 11 hgb Plan Trend Transfuse  for active hemorrhage and shock  Or hgb < 8   Post-operative thrombocytopenia Plan Trend   H/o DM (insulin  dep) Plan IV insulin  immediate post-op then transition to ssi for goal 140-180    Labs   CBC: Recent Labs  Lab 06/28/24 1047 07/01/24 0807 07/01/24 0929 07/01/24 0947 07/01/24 1007 07/01/24 1029 07/01/24 1033  WBC 4.5  --   --   --    --   --   --   HGB 16.0   < > 10.5* 10.2* 11.2* 11.8* 11.2*  HCT 47.4   < > 31.0* 30.0* 33.0* 34.5* 33.0*  MCV 81.6  --   --   --   --   --   --   PLT 229  --   --   --   --  134*  --    < > = values in this interval not displayed.    Basic Metabolic Panel: Recent Labs  Lab 06/28/24 1047 07/01/24 0807 07/01/24 0906 07/01/24 0929 07/01/24 0947 07/01/24 1007 07/01/24 1033  NA 137 139 138 138 136 137 137  K 4.3 4.1 4.2 4.2 4.8 4.8 4.7  CL 103 102 103  --   --  103  --   CO2 27  --   --   --   --   --   --   GLUCOSE 156* 170* 178*  --   --  125*  --   BUN 9 11 10   --   --  11  --   CREATININE 0.95 0.90 0.90  --   --  0.80  --   CALCIUM 8.9  --   --   --   --   --   --    GFR: Estimated Creatinine Clearance: 129.7 mL/min (by C-G formula based on SCr of 0.8 mg/dL). Recent Labs  Lab 06/28/24 1047  WBC 4.5    Liver Function Tests: Recent Labs  Lab 06/28/24 1047  AST 17  ALT 19  ALKPHOS 62  BILITOT 0.9  PROT 7.3  ALBUMIN 3.7   No results for input(s): LIPASE, AMYLASE in the last 168 hours. No results for input(s): AMMONIA in the last 168 hours.  ABG    Component Value Date/Time   PHART 7.420 07/01/2024 1033   PCO2ART 38.0 07/01/2024 1033   PO2ART 288 (H) 07/01/2024 1033   HCO3 24.7 07/01/2024 1033   TCO2 26 07/01/2024 1033   ACIDBASEDEF 2.0 07/01/2024 0947   O2SAT 100 07/01/2024 1033     Coagulation Profile: Recent Labs  Lab 06/28/24 1047  INR 0.9    Cardiac Enzymes: No results for input(s): CKTOTAL, CKMB, CKMBINDEX, TROPONINI in the last 168 hours.  HbA1C: Hgb A1c MFr Bld  Date/Time Value Ref Range Status  06/28/2024 10:47 AM 6.5 (H) 4.8 - 5.6 % Final    Comment:    (NOTE) Diagnosis of Diabetes The following HbA1c ranges recommended by the American Diabetes Association (ADA) may be used as an aid in the diagnosis of diabetes mellitus.  Hemoglobin             Suggested A1C NGSP%              Diagnosis  <5.7                    Non Diabetic  5.7-6.4                Pre-Diabetic  >6.4  Diabetic  <7.0                   Glycemic control for                       adults with diabetes.    04/02/2024 09:24 AM 7.4 (H) 4.8 - 5.6 % Final    Comment:             Prediabetes: 5.7 - 6.4          Diabetes: >6.4          Glycemic control for adults with diabetes: <7.0     CBG: Recent Labs  Lab 06/28/24 1019 07/01/24 0553  GLUCAP 170* 161*    Review of Systems:   Not able   Past Medical History:  He,  has a past medical history of Aortic stenosis, Diabetes mellitus (HCC), Dyspnea, Essential hypertension, Heart murmur, Hypothyroidism, Morbid obesity (HCC), Paroxysmal A-fib (HCC), and Paroxysmal atrial flutter (HCC).   Surgical History:   Past Surgical History:  Procedure Laterality Date   CHOLECYSTECTOMY     CORONARY ANGIOGRAPHY N/A 06/06/2024   Procedure: CORONARY ANGIOGRAPHY;  Surgeon: Verlin Lonni BIRCH, MD;  Location: [facility] INVASIVE CV LAB;  Service: Cardiovascular;  Laterality: N/A;     Social History:   reports that he has never smoked. He has never used smokeless tobacco. He reports that he does not drink alcohol and does not use drugs.   Family History:  His family history includes COPD in his mother; Epilepsy in his father; Schizophrenia in his brother and mother.   Allergies No Known Allergies   Home Medications  Prior to Admission medications   Medication Sig Start Date End Date Taking? Authorizing Provider  insulin  glargine (LANTUS  SOLOSTAR) 100 UNIT/ML Solostar Pen Inject 20 Units into the skin daily. 05/02/24  Yes Geroldine Berg, MD  metFORMIN  (GLUCOPHAGE -XR) 750 MG 24 hr tablet Take 1 tablet (750 mg total) by mouth daily with breakfast. 05/02/24  Yes Delo, Berg, MD  OVER THE COUNTER MEDICATION Take 1 tablet by mouth daily. Boost TRT Gummy   Yes [provider]  Insulin  Pen Needle (PEN NEEDLES) 31G X 5 MM MISC 1 each by Does not apply route daily. May  substitute to any manufacturer covered by patient's insurance. 05/02/24   Geroldine Berg, MD  warfarin (COUMADIN ) 3 MG tablet Take 1 tablet (3 mg total) by mouth daily. 04/30/24   Mallipeddi, Vishnu P, MD      I personally  spent 32 minutes  on this patient which included: review of medical records, nursing notes, progress notes, evaluation, interpretation of lab data and diagnostic studies, taking independent history, performing exam, documenting plan, ordering diagnostics and interventions for the following critical care issues: Circulatory shock, Acute respiratory failure with the following interventions which included: titration of ventilatory support, titration of hemodynamic drips to desired MAP

## 2024-07-01 NOTE — Anesthesia Procedure Notes (Signed)
 Arterial Line Insertion Start/End10/27/2025 7:15 AM Performed by: Lamar Lucie DASEN, CRNA  Patient location: Pre-op. Preanesthetic checklist: patient identified, IV checked, site marked, risks and benefits discussed, surgical consent, monitors and equipment checked, pre-op evaluation, timeout performed and anesthesia consent Lidocaine 1% used for infiltration Left, radial was placed Catheter size: 20 G Hand hygiene performed  and maximum sterile barriers used   Attempts: 2 Procedure performed using ultrasound to evaluate access site. Following insertion, dressing applied and Biopatch. Post procedure assessment: normal and unchanged  Patient tolerated the procedure well with no immediate complications.

## 2024-07-01 NOTE — Hospital Course (Signed)
  Referring: Verlin Lonni BIRCH* Primary Care: Grooms, [place], [place] Primary Cardiologist:Vishnu SHAUNNA May[place], MD   History of Present Illness:    At the time of CT surgical evaluation Johnathan Richmond is a 63 y.o. male who presents for surgical evaluation of severe aortic stenosis.  He has a history of DM, and paroxysmal atrial flutter.  He has been symptomatic with shortness of breath.  He denies any chest pain, or presyncope.  He was found on echocardiogram to have a bicuspid aortic valve with mild to moderate AI and severe aortic stenosis.  He was admitted this hospitalization for aortic valve replacement and clipping of the left atrial appendage.  Hospital course: The patient was admitted electively on 07/01/2024 and taken the operating room at which time he underwent aortic valve replacement with #25 Inspiris Resilia bioprosthetic valve.  Additionally the left atrial appendage was clipped with a an atrial cure size 45.  He tolerated the procedure well and was taken to the surgical intensive care unit in stable condition.  Postoperative hospital course:  Patient was extubated using standard postcardiac surgical protocols without difficulty.  He initially did require some Levophed for hemodynamic support but this was able to be weaned fairly quickly.  Diabetes was managed with usual protocols.  He will be transition to home regimen by discharge.  He does not have a expected acute blood loss anemia which is being monitored clinically and has not required transfusion.  Renal function has remained within normal limits.

## 2024-07-02 ENCOUNTER — Inpatient Hospital Stay (HOSPITAL_COMMUNITY)

## 2024-07-02 ENCOUNTER — Encounter (HOSPITAL_COMMUNITY): Payer: Self-pay | Admitting: Thoracic Surgery (Cardiothoracic Vascular Surgery)

## 2024-07-02 ENCOUNTER — Other Ambulatory Visit: Payer: Self-pay | Admitting: Cardiology

## 2024-07-02 DIAGNOSIS — I480 Paroxysmal atrial fibrillation: Secondary | ICD-10-CM

## 2024-07-02 DIAGNOSIS — I4510 Unspecified right bundle-branch block: Secondary | ICD-10-CM

## 2024-07-02 DIAGNOSIS — Z953 Presence of xenogenic heart valve: Secondary | ICD-10-CM

## 2024-07-02 DIAGNOSIS — Z952 Presence of prosthetic heart valve: Secondary | ICD-10-CM

## 2024-07-02 DIAGNOSIS — J90 Pleural effusion, not elsewhere classified: Secondary | ICD-10-CM

## 2024-07-02 LAB — CBC
HCT: 33.6 % — ABNORMAL LOW (ref 39.0–52.0)
HCT: 38.4 % — ABNORMAL LOW (ref 39.0–52.0)
Hemoglobin: 11.6 g/dL — ABNORMAL LOW (ref 13.0–17.0)
Hemoglobin: 12.8 g/dL — ABNORMAL LOW (ref 13.0–17.0)
MCH: 28.4 pg (ref 26.0–34.0)
MCH: 27.7 pg (ref 26.0–34.0)
MCHC: 34.5 g/dL (ref 30.0–36.0)
MCHC: 33.3 g/dL (ref 30.0–36.0)
MCV: 82.4 fL (ref 80.0–100.0)
MCV: 83.1 fL (ref 80.0–100.0)
Platelets: 139 K/uL — ABNORMAL LOW (ref 150–400)
Platelets: 134 K/uL — ABNORMAL LOW (ref 150–400)
RBC: 4.08 MIL/uL — ABNORMAL LOW (ref 4.22–5.81)
RBC: 4.62 MIL/uL (ref 4.22–5.81)
RDW: 14.1 % (ref 11.5–15.5)
RDW: 14.2 % (ref 11.5–15.5)
WBC: 12.5 K/uL — ABNORMAL HIGH (ref 4.0–10.5)
WBC: 14.6 K/uL — ABNORMAL HIGH (ref 4.0–10.5)
nRBC: 0 % (ref 0.0–0.2)
nRBC: 0 % (ref 0.0–0.2)

## 2024-07-02 LAB — GLUCOSE, CAPILLARY
Glucose-Capillary: 116 mg/dL — ABNORMAL HIGH (ref 70–99)
Glucose-Capillary: 122 mg/dL — ABNORMAL HIGH (ref 70–99)
Glucose-Capillary: 122 mg/dL — ABNORMAL HIGH (ref 70–99)
Glucose-Capillary: 129 mg/dL — ABNORMAL HIGH (ref 70–99)
Glucose-Capillary: 131 mg/dL — ABNORMAL HIGH (ref 70–99)
Glucose-Capillary: 134 mg/dL — ABNORMAL HIGH (ref 70–99)
Glucose-Capillary: 142 mg/dL — ABNORMAL HIGH (ref 70–99)
Glucose-Capillary: 144 mg/dL — ABNORMAL HIGH (ref 70–99)
Glucose-Capillary: 145 mg/dL — ABNORMAL HIGH (ref 70–99)
Glucose-Capillary: 146 mg/dL — ABNORMAL HIGH (ref 70–99)

## 2024-07-02 LAB — BASIC METABOLIC PANEL WITH GFR
Anion gap: 6 (ref 5–15)
Anion gap: 11 (ref 5–15)
BUN: 10 mg/dL (ref 8–23)
BUN: 12 mg/dL (ref 8–23)
CO2: 21 mmol/L — ABNORMAL LOW (ref 22–32)
CO2: 22 mmol/L (ref 22–32)
Calcium: 7.5 mg/dL — ABNORMAL LOW (ref 8.9–10.3)
Calcium: 8.1 mg/dL — ABNORMAL LOW (ref 8.9–10.3)
Chloride: 108 mmol/L (ref 98–111)
Chloride: 101 mmol/L (ref 98–111)
Creatinine, Ser: 1.05 mg/dL (ref 0.61–1.24)
Creatinine, Ser: 1.07 mg/dL (ref 0.61–1.24)
GFR, Estimated: >60 mL/min (ref >60)
GFR, Estimated: >60 mL/min (ref >60)
Glucose, Bld: 144 mg/dL — ABNORMAL HIGH (ref 70–99)
Glucose, Bld: 165 mg/dL — ABNORMAL HIGH (ref 70–99)
Potassium: 3.9 mmol/L (ref 3.5–5.1)
Potassium: 4.4 mmol/L (ref 3.5–5.1)
Sodium: 135 mmol/L (ref 135–145)
Sodium: 134 mmol/L — ABNORMAL LOW (ref 135–145)

## 2024-07-02 LAB — SURGICAL PATHOLOGY

## 2024-07-02 LAB — MAGNESIUM
Magnesium: 2.1 mg/dL (ref 1.7–2.4)
Magnesium: 2.3 mg/dL (ref 1.7–2.4)

## 2024-07-02 MED ORDER — POTASSIUM CHLORIDE CRYS ER 20 MEQ PO TBCR
20.0000 meq | EXTENDED_RELEASE_TABLET | Freq: Once | ORAL | Status: AC
  Administered 2024-07-02: 20 meq via ORAL
  Filled 2024-07-02: qty 1

## 2024-07-02 MED ORDER — ENOXAPARIN SODIUM 40 MG/0.4ML IJ SOSY
40.0000 mg | PREFILLED_SYRINGE | Freq: Every day | INTRAMUSCULAR | Status: DC
  Administered 2024-07-02 – 2024-07-05 (×4): 40 mg via SUBCUTANEOUS
  Filled 2024-07-02 (×4): qty 0.4

## 2024-07-02 MED ORDER — INSULIN ASPART 100 UNIT/ML IJ SOLN
0.0000 [IU] | INTRAMUSCULAR | Status: DC
  Administered 2024-07-02 – 2024-07-03 (×4): 2 [IU] via SUBCUTANEOUS
  Administered 2024-07-03 (×2): 4 [IU] via SUBCUTANEOUS

## 2024-07-02 MED ORDER — INSULIN GLARGINE-YFGN 100 UNIT/ML ~~LOC~~ SOLN
14.0000 [IU] | SUBCUTANEOUS | Status: DC
  Administered 2024-07-02 – 2024-07-06 (×5): 14 [IU] via SUBCUTANEOUS
  Filled 2024-07-02 (×5): qty 0.14

## 2024-07-02 NOTE — Progress Notes (Signed)
 NAME:  Johnathan Richmond, MRN:  [id number], DOB:  1960-10-16, LOS: 1 ADMISSION DATE:  07/01/2024, CONSULTATION DATE:  10/27 REFERRING MD:  Shyrl, CHIEF COMPLAINT:  post-cardiac surgery ICU care   History of Present Illness:  63 year old male w/ h/o bicuspid aortic valve and severe AS. Initially dx'd as moderate in 2020. Seen in 2025 after being lost to follow-up w/ new onset exertional dyspnea and chest pain in Aug. At this time ECHO showed EF 65-70%, mild MR, prob bicuspid aorta, and severe AS w/ mean gradient 39.5 mmHg. Left heart cath completed 10/2 nml CAs. Presented to [facility] 10/27 for elective AVR w/ left atrial appendage clipping   OR Course  Bypass time: 9:18-11:21 (2hrs75min) Clamp time: [phone] (1hr26 min) Cell saver: 740 EBL 1100 CT output on arrival to ICU: 0   Pertinent  Medical History  AS, PAF, DM, HTN, hypothyroidism, MO Never smoked Significant Hospital Events: Including procedures, antibiotic start and stop dates in addition to other pertinent events   10/27 to OR for AVR 10/27 - Extubated  Interim History / Subjective:  Sedated on dex   Objective    Blood pressure (!) 108/58, pulse 73, temperature 99.3 F (37.4 C), resp. rate 15, height 6' 2 (1.88 m), weight 125.6 kg, SpO2 95%. CVP:  [0 mmHg-22 mmHg] 8 mmHg CO:  [5.4 L/min-11.2 L/min] 8.9 L/min CI:  [2.2 L/min/m2-4.5 L/min/m2] 3.6 L/min/m2  Vent Mode: SIMV;PRVC;PSV FiO2 (%):  [50 %] 50 % Set Rate:  [16 bmp] 16 bmp Vt Set:  [650 mL] 650 mL PEEP:  [5 cmH20] 5 cmH20 Pressure Support:  [10 cmH20] 10 cmH20   Intake/Output Summary (Last 24 hours) at 07/02/2024 1611 Last data filed at 07/02/2024 1200 Gross per 24 hour  Intake 1457.9 ml  Output 1268 ml  Net 189.9 ml   Filed Weights   07/01/24 0547 07/02/24 0500  Weight: 122.5 kg 125.6 kg    Examination: General: Alert and oriented no acute distress HENT: Midline trachea both pupils equal reactive to light Lungs: Clear lungs  bilaterally Cardiovascular: 1 S2 heard no S3 no murmurs dressing intact on the sternum Abdomen: soft hypoactive  Extremities: warm well perfused  Neuro: Alert and oriented moving all 4 limbs  Resolved problem list   Assessment and Plan  S/p AVR w/ left atrial appendage clipping for severe AS (lightfoot 10/27) CI 1.4  Getting albumin; still on low dose Neo Plan  Continue post operative telemetry monitoring for conduction disturbances temporary epicardial pacing wires in place; has back up ppm at bedside  MAP/SBP goal as directed by post-operative orderset w/ PRN pressors, inoptropes and antihypertensive management as indicated.  Ensure euvolemia Close observation for evidence of bleeding, including chest tube output and surgical site,  if CT output exceeds 200 mL/hour for 2 consecutive hours or if there is more than 500 mL in the first hour postoperatively ensure surgical team aware Tight glycemic control Multi-modal analgesia  Initiate post op BB when hemodynamically appropriate (holding for now given bradycardia)  SCDs immediate post-op; w/ DVT prophylaxis vs systemic AC to be initiated post-op day 1 (as directed by surgical team) Initiate post-operative AC once [facility] w/ cardiac surgeon, VKA therapy with an INR target of 2.5 for the first 3-6 months, followed by lifelong aspirin  (75-100 mg daily) Complete prophylactic antibiotics   Discontinue Foley catheter Changes insulin  GTT to be subq Discontinue art line May need Lasix  Post operative ventilator management  Plan Extubated 10/27 On 4 L nasal cannula  H/u PAF Plan Tele  Rate control  Amiodarone  AC when ok w/ surgical team   Expected post operative anemia  Hgb baseline 16; EBL: 11000 got 740 cell saver istat post op 11 hgb Plan Trend Transfuse for active hemorrhage and shock  Or hgb < 8   Post-operative thrombocytopenia Plan Trend   H/o DM (insulin  dep) Plan IV insulin  immediate post-op then transition to ssi  for goal 140-180    Labs   CBC: Recent Labs  Lab 06/28/24 1047 07/01/24 0807 07/01/24 1029 07/01/24 1033 07/01/24 1139 07/01/24 1249 07/01/24 1318 07/01/24 1855 07/02/24 0500  WBC 4.5  --   --   --   --   --  15.6* 11.5* 12.5*  HGB 16.0   < > 11.8*   < > 11.2* 11.2* 11.7* 12.6* 11.6*  HCT 47.4   < > 34.5*   < > 33.0* 33.0* 34.3* 37.1* 33.6*  MCV 81.6  --   --   --   --   --  82.1 81.5 82.4  PLT 229  --  134*  --   --   --  138* 139* 139*   < > = values in this interval not displayed.    Basic Metabolic Panel: Recent Labs  Lab 06/28/24 1047 07/01/24 0807 07/01/24 0906 07/01/24 0929 07/01/24 1007 07/01/24 1033 07/01/24 1135 07/01/24 1139 07/01/24 1249 07/01/24 1855 07/02/24 0500  NA 137   < > 138   < > 137   < > 137 138 137 138 135  K 4.3   < > 4.2   < > 4.8   < > 4.4 4.5 4.2 4.3 3.9  CL 103   < > 103  --  103  --   --  104  --  109 108  CO2 27  --   --   --   --   --   --   --   --  20* 21*  GLUCOSE 156*   < > 178*  --  125*  --   --  126*  --  130* 144*  BUN 9   < > 10  --  11  --   --  11  --  11 10  CREATININE 0.95   < > 0.90  --  0.80  --   --  0.90  --  1.04 1.05  CALCIUM 8.9  --   --   --   --   --   --   --   --  7.9* 7.5*  MG  --   --   --   --   --   --   --   --   --  3.0* 2.1   < > = values in this interval not displayed.   GFR: Estimated Creatinine Clearance: 100.1 mL/min (by C-G formula based on SCr of 1.05 mg/dL). Recent Labs  Lab 06/28/24 1047 07/01/24 1318 07/01/24 1338 07/01/24 1535 07/01/24 1855 07/02/24 0500  WBC 4.5 15.6*  --   --  11.5* 12.5*  LATICACIDVEN  --   --  1.3 0.9  --   --     Liver Function Tests: Recent Labs  Lab 06/28/24 1047  AST 17  ALT 19  ALKPHOS 62  BILITOT 0.9  PROT 7.3  ALBUMIN 3.7   No results for input(s): LIPASE, AMYLASE in the last 168 hours. No results for input(s): AMMONIA in the last 168 hours.  ABG  Component Value Date/Time   PHART 7.353 07/01/2024 1249   PCO2ART 38.9 07/01/2024  1249   PO2ART 138 (H) 07/01/2024 1249   HCO3 21.5 07/01/2024 1249   TCO2 23 07/01/2024 1249   ACIDBASEDEF 4.0 (H) 07/01/2024 1249   O2SAT 99 07/01/2024 1249     Coagulation Profile: Recent Labs  Lab 06/28/24 1047 07/01/24 1318  INR 0.9 1.4*    Cardiac Enzymes: No results for input(s): CKTOTAL, CKMB, CKMBINDEX, TROPONINI in the last 168 hours.  HbA1C: Hgb A1c MFr Bld  Date/Time Value Ref Range Status  06/28/2024 10:47 AM 6.5 (H) 4.8 - 5.6 % Final    Comment:    (NOTE) Diagnosis of Diabetes The following HbA1c ranges recommended by the American Diabetes Association (ADA) may be used as an aid in the diagnosis of diabetes mellitus.  Hemoglobin             Suggested A1C NGSP%              Diagnosis  <5.7                   Non Diabetic  5.7-6.4                Pre-Diabetic  >6.4                   Diabetic  <7.0                   Glycemic control for                       adults with diabetes.    04/02/2024 09:24 AM 7.4 (H) 4.8 - 5.6 % Final    Comment:             Prediabetes: 5.7 - 6.4          Diabetes: >6.4          Glycemic control for adults with diabetes: <7.0     CBG: Recent Labs  Lab 07/02/24 0445 07/02/24 0637 07/02/24 0824 07/02/24 0928 07/02/24 1037  GLUCAP 122* 144* 134* 116* 131*    Review of Systems:   Not able   Past Medical History:  He,  has a past medical history of Aortic stenosis, Diabetes mellitus (HCC), Dyspnea, Essential hypertension, Heart murmur, Hypothyroidism, Morbid obesity (HCC), Paroxysmal A-fib (HCC), and Paroxysmal atrial flutter (HCC).   Surgical History:   Past Surgical History:  Procedure Laterality Date   AORTIC VALVE REPLACEMENT N/A 07/01/2024   Procedure: REPLACEMENT, AORTIC VALVE, OPEN WITH INSPIRIS VALVE SIZE ;  Surgeon: Shyrl Linnie KIDD, MD;  Location: [facility] OR;  Service: Open Heart Surgery;  Laterality: N/A;   CHOLECYSTECTOMY     CLIPPING OF ATRIAL APPENDAGE N/A 07/01/2024   Procedure:  CLIPPING, LEFT ATRIAL APPENDAGE USING  ATRICURE CLIP SIZE 45;  Surgeon: Shyrl Linnie KIDD, MD;  Location: [facility] OR;  Service: Open Heart Surgery;  Laterality: N/A;   CORONARY ANGIOGRAPHY N/A 06/06/2024   Procedure: CORONARY ANGIOGRAPHY;  Surgeon: Verlin Lonni BIRCH, MD;  Location: [facility] INVASIVE CV LAB;  Service: Cardiovascular;  Laterality: N/A;   TEE WITHOUT CARDIOVERSION N/A 07/01/2024   Procedure: ECHOCARDIOGRAM, TRANSESOPHAGEAL;  Surgeon: Shyrl Linnie KIDD, MD;  Location: [facility] OR;  Service: Open Heart Surgery;  Laterality: N/A;     Social History:   reports that he has never smoked. He has never used smokeless tobacco. He reports that he does not drink alcohol and does not use drugs.  Family History:  His family history includes COPD in his mother; Epilepsy in his father; Schizophrenia in his brother and mother.   Allergies No Known Allergies   Home Medications  Prior to Admission medications   Medication Sig Start Date End Date Taking? Authorizing Provider  insulin  glargine (LANTUS  SOLOSTAR) 100 UNIT/ML Solostar Pen Inject 20 Units into the skin daily. 05/02/24  Yes Geroldine Berg, MD  metFORMIN  (GLUCOPHAGE -XR) 750 MG 24 hr tablet Take 1 tablet (750 mg total) by mouth daily with breakfast. 05/02/24  Yes Delo, Berg, MD  OVER THE COUNTER MEDICATION Take 1 tablet by mouth daily. Boost TRT Gummy   Yes [provider]  Insulin  Pen Needle (PEN NEEDLES) 31G X 5 MM MISC 1 each by Does not apply route daily. May substitute to any manufacturer covered by patient's insurance. 05/02/24   Geroldine Berg, MD  warfarin (COUMADIN ) 3 MG tablet Take 1 tablet (3 mg total) by mouth daily. 04/30/24   Mallipeddi, Diannah SQUIBB, MD     Critical Care time: 35 minutes     Tamela Stakes, MD  Attending Physician, Critical Care Medicine [facility] See Amion for pager If no response to pager, please call [phone] until 7pm After 7pm, Please call E-link  [phone]

## 2024-07-02 NOTE — Progress Notes (Signed)
 Inpatient Rehab Admissions Coordinator:   Per therapy recommendations,  patient was screened for CIR candidacy by Leita Kleine, MS, CCC-SLP. At this time, Pt. Appears to be a a potential candidate for AIR; however, her payor is OON with CIR. If AIR is desired. TOC will need to fax referral to in network IRFs. Please contact me any with questions.  Leita Kleine, MS, CCC-SLP Rehab Admissions Coordinator  [phone] (office)  Leita Kleine, MS, CCC-SLP Rehab Admissions Coordinator  [phone] (office)

## 2024-07-02 NOTE — Evaluation (Signed)
 Physical Therapy Evaluation Patient Details Name: Johnathan Richmond MRN: [id number] DOB: 11-23-1960 Today's Date: 07/02/2024  History of Present Illness  63 y.o. male admitted 10/27 for and underwent elective aortic valve replacement, and Left atrial appendage clipping.  Past medical history of DM, and paroxysmal atrial flutter.  Clinical Impression  Patient is s/p above surgery resulting in functional limitations due to the deficits listed below (see PT Problem List). Previously independent, recently retried, likes to golf, lives with 2 brothers. Requires min assist with transfers, and utilized eva walker for gait at min assist level with +2 support for equipment management and safety. VSS throughout on 2L. Tolerated two bouts of 80' and 30' respectively with seated rest break between sets. Reviewed precautions, handout provided, use of IS discussed. Patient will benefit from intensive inpatient follow-up therapy, >3 hours/day. Will update as pt progresses.Patient will benefit from acute skilled PT to increase their independence and safety with mobility to facilitate discharge.           If plan is discharge home, recommend the following: A lot of help with walking and/or transfers;A lot of help with bathing/dressing/bathroom;Assistance with cooking/housework;Assist for transportation   Can travel by private vehicle        Equipment Recommendations Rolling walker (2 wheels) (TBD)  Recommendations for Other Services  Rehab consult    Functional Status Assessment Patient has had a recent decline in their functional status and demonstrates the ability to make significant improvements in function in a reasonable and predictable amount of time.     Precautions / Restrictions Precautions Precautions: Sternal Precaution Booklet Issued: Yes (comment) Recall of Precautions/Restrictions: Impaired Restrictions Weight Bearing Restrictions Per Provider Order: Yes Other Position/Activity  Restrictions: cardiac sternal precautions      Mobility  Bed Mobility               General bed mobility comments: In recliner    Transfers Overall transfer level: Needs assistance Equipment used: Ambulation equipment used Transfers: Sit to/from Stand Sit to Stand: Min assist           General transfer comment: Min assist for boost and balance to stand from various surfaces. Cues for technique and to hold heart pillow for comfort and recall. Smoothly transitions UEs on and off eva walker with cues.    Ambulation/Gait Ambulation/Gait assistance: Min assist, +2 safety/equipment Gait Distance (Feet): 80 Feet (+30) Assistive device: Elyn Finder Gait Pattern/deviations: Step-through pattern, Decreased stride length, Shuffle, Trunk flexed, Narrow base of support Gait velocity: dec Gait velocity interpretation: <1.31 ft/sec, indicative of household ambulator   General Gait Details: Cues for upright posture and forward gaze. Safely maintains sternal precautions with min assist for eva walker control and +2 assist for equipment management. Pt required one prolonged standing rest break to complete 80 foot distance. After prolonged seated rest break able to complete a second shorter bout. SpO2 92% on 2L supplemental O2. Secound bout closer to [facility] level  Stairs            Wheelchair Mobility     Tilt Bed    Modified Rankin (Stroke Patients Only)       Balance Overall balance assessment: Needs assistance Sitting-balance support: No upper extremity supported, Feet supported Sitting balance-Leahy Scale: Fair     Standing balance support: No upper extremity supported Standing balance-Leahy Scale: Fair Standing balance comment: CGA without UE support  Pertinent Vitals/Pain Pain Assessment Pain Assessment: Faces Faces Pain Scale: Hurts even more Pain Location: sternum Pain Descriptors / Indicators: Aching Pain  Intervention(s): Limited activity within patient's tolerance, Monitored during session, Repositioned    Home Living Family/patient expects to be discharged to:: Private residence Living Arrangements: Other relatives Available Help at Discharge: Family;Available 24 hours/day (Lives with two brothers) Type of Home: Apartment Home Access: Level entry       Home Layout: One level Home Equipment: None      Prior Function Prior Level of Function : Independent/Modified Independent;Driving             Mobility Comments: ind, recently retired from [employer]. Likes to golf. no AD use. ADLs Comments: ind, recently retired from [employer]. Likes to golf. no AD use.     Extremity/Trunk Assessment   Upper Extremity Assessment Upper Extremity Assessment: Defer to OT evaluation    Lower Extremity Assessment Lower Extremity Assessment: Generalized weakness (as expected post-op)       Communication   Communication Communication: No apparent difficulties    Cognition Arousal: Alert Behavior During Therapy: WFL for tasks assessed/performed   PT - Cognitive impairments: No apparent impairments                         Following commands: Intact       Cueing Cueing Techniques: Verbal cues, Gestural cues     General Comments General comments (skin integrity, edema, etc.): BP 142/64. SpO2 95% on RA at rest.    Exercises     Assessment/Plan    PT Assessment Patient needs continued PT services  PT Problem List Decreased strength;Decreased range of motion;Decreased activity tolerance;Decreased balance;Decreased mobility;Decreased knowledge of use of DME;Decreased knowledge of precautions;Cardiopulmonary status limiting activity;Pain       PT Treatment Interventions DME instruction;Gait training;Functional mobility training;Therapeutic activities;Therapeutic exercise;Balance training;Neuromuscular  re-education;Patient/family education    PT Goals (Current goals can be found in the Care Plan section)  Acute Rehab PT Goals Patient Stated Goal: Get stronger PT Goal Formulation: With patient Time For Goal Achievement: 07/16/24 Potential to Achieve Goals: Good    Frequency Min 3X/week     Co-evaluation               AM-PAC PT 6 Clicks Mobility  Outcome Measure Help needed turning from your back to your side while in a flat bed without using bedrails?: A Little Help needed moving from lying on your back to sitting on the side of a flat bed without using bedrails?: A Lot Help needed moving to and from a bed to a chair (including a wheelchair)?: A Lot Help needed standing up from a chair using your arms (e.g., wheelchair or bedside chair)?: A Little Help needed to walk in hospital room?: A Little Help needed climbing 3-5 steps with a railing? : Total 6 Click Score: 14    End of Session Equipment Utilized During Treatment: Oxygen Activity Tolerance: Patient tolerated treatment well Patient left: in chair;with call bell/phone within reach;with chair alarm set;with nursing/sitter in room Nurse Communication: Mobility status PT Visit Diagnosis: Unsteadiness on feet (R26.81);Other abnormalities of gait and mobility (R26.89);Muscle weakness (generalized) (M62.81);Difficulty in walking, not elsewhere classified (R26.2);Pain Pain - part of body:  (sterum)    Time: 1135-1200 PT Time Calculation (min) (ACUTE ONLY): 25 min   Charges:   PT Evaluation $PT Eval Low Complexity: 1 Low PT Treatments $Gait Training: 8-22 mins PT General Charges $$  ACUTE PT VISIT: 1 Visit         Leontine Roads, PT, DPT [facility]  Rehabilitation Services Physical Therapist Office: [phone] Website: [email or web address]   Leontine GORMAN Roads 07/02/2024, 1:39 PM

## 2024-07-02 NOTE — Progress Notes (Signed)
   [address], [place] [address]             [phone]   POD # 1 AVR, LAA clip  BP 114/61   Pulse 83   Temp 99.3 F (37.4 C)   Resp 14   Ht 6' 2 (1.88 m)   Wt 125.6 kg   SpO2 94%   BMI 35.55 kg/m    Intake/Output Summary (Last 24 hours) at 07/02/2024 1758 Last data filed at 07/02/2024 1700 Gross per 24 hour  Intake 1561.67 ml  Output 1128 ml  Net 433.67 ml   K 4.4, creatinine 1.07 Hct 38  Doing well POD # 1  Marqueta Pulley C. Kerrin, MD [facility] [phone]

## 2024-07-02 NOTE — Progress Notes (Addendum)
 1 Day Post-Op Procedure(s) (LRB): REPLACEMENT, AORTIC VALVE, OPEN WITH INSPIRIS VALVE SIZE (N/A) ECHOCARDIOGRAM, TRANSESOPHAGEAL (N/A) CLIPPING, LEFT ATRIAL APPENDAGE USING  ATRICURE CLIP SIZE 45 (N/A) Subjective: Some discomfort but pain meds working well  Objective: Vital signs in last 24 hours: Temp:  [98.4 F (36.9 C)-99.9 F (37.7 C)] 99.3 F (37.4 C) (10/28 0545) Pulse Rate:  [53-84] 80 (10/28 0615) Cardiac Rhythm: Normal sinus rhythm (10/27 1930) Resp:  [12-26] 15 (10/28 0615) BP: (103-122)/(56-65) 115/56 (10/27 1746) SpO2:  [96 %-100 %] 96 % (10/28 0615) Arterial Line BP: (84-195)/(35-166) 108/44 (10/28 0615) FiO2 (%):  [40 %-50 %] 50 % (10/27 1705) Weight:  [125.6 kg] 125.6 kg (10/28 0500)  Hemodynamic parameters for last 24 hours: CVP:  [0 mmHg-20 mmHg] 11 mmHg CO:  [3.2 L/min-11.2 L/min] 8.3 L/min CI:  [1.3 L/min/m2-4.5 L/min/m2] 3.4 L/min/m2  Intake/Output from previous day: 10/27 0701 - 10/28 0700 In: 5380.2 [P.O.:60; I.V.:1717; Blood:740; IV Piggyback:2863.2] Out: 2383 [Urine:1853; Chest Tube:530] Intake/Output this shift: No intake/output data recorded.  General appearance: alert, cooperative, and no distress Heart: regular rate and rhythm Lungs: clear anteriorly Abdomen: soft, non tender Extremities: no edema Wound: dressings intact  Lab Results: Recent Labs    07/01/24 1855 07/02/24 0500  WBC 11.5* 12.5*  HGB 12.6* 11.6*  HCT 37.1* 33.6*  PLT 139* 139*   BMET:  Recent Labs    07/01/24 1855 07/02/24 0500  NA 138 135  K 4.3 3.9  CL 109 108  CO2 20* 21*  GLUCOSE 130* 144*  BUN 11 10  CREATININE 1.04 1.05  CALCIUM 7.9* 7.5*    PT/INR:  Recent Labs    07/01/24 1318  LABPROT 17.6*  INR 1.4*   ABG    Component Value Date/Time   PHART 7.353 07/01/2024 1249   HCO3 21.5 07/01/2024 1249   TCO2 23 07/01/2024 1249   ACIDBASEDEF 4.0 (H) 07/01/2024 1249   O2SAT 99 07/01/2024 1249   CBG (last 3)  Recent Labs    07/02/24 0301  07/02/24 0445 07/02/24 0637  GLUCAP 122* 122* 144*    Meds Scheduled Meds:  acetaminophen   1,000 mg Oral Q6H   Or   acetaminophen  (TYLENOL ) oral liquid 160 mg/5 mL  1,000 mg Per Tube Q6H   aspirin  EC  325 mg Oral Daily   Or   aspirin   324 mg Per Tube Daily   bisacodyl  10 mg Oral Daily   Or   bisacodyl  10 mg Rectal Daily    ceFAZolin (ANCEF) IV  3 g Intravenous Q8H   Chlorhexidine Gluconate Cloth  6 each Topical Daily   Chlorhexidine Gluconate Cloth  6 each Topical Daily   Chlorhexidine Gluconate Cloth  6 each Topical Daily   docusate sodium  200 mg Oral Daily   metoCLOPramide (REGLAN) injection  10 mg Intravenous Q6H   metoprolol tartrate  12.5 mg Oral BID   Or   metoprolol tartrate  12.5 mg Per Tube BID   [START ON 07/03/2024] pantoprazole   40 mg Oral Daily   pantoprazole  (PROTONIX ) IV  40 mg Intravenous QHS   sodium chloride  flush  10-40 mL Intracatheter Q12H   sodium chloride  flush  3 mL Intravenous Q12H   Continuous Infusions:  sodium chloride      sodium chloride      sodium chloride      albumin human Stopped (07/01/24 1401)   dexmedetomidine (PRECEDEX) IV infusion 0.327 mcg/kg/hr (07/02/24 0700)   insulin  2.4 Units/hr (07/02/24 0700)   lactated ringers  20  mL/hr at 07/02/24 0700   lactated ringers      niCARDipine Stopped (07/01/24 1439)   norepinephrine (LEVOPHED) Adult infusion 1 mcg/min (07/02/24 0700)   PRN Meds:.sodium chloride , albumin human, dextrose , metoprolol tartrate, midazolam PF, morphine injection, ondansetron  (ZOFRAN ) IV, mouth rinse, oxyCODONE , sodium chloride  flush, sodium chloride  flush, traMADol  Xrays DG Chest Port 1 View Result Date: 07/02/2024 EXAM: 1 VIEW XRAY OF THE CHEST 07/02/2024 05:24:00 AM COMPARISON: 07/01/2024 CLINICAL HISTORY: S/P aortic valve replacement with bioprosthetic valve FINDINGS: LINES, TUBES AND DEVICES: Stable right internal jugular central venous catheter and bilateral chest tubes. Endotracheal tube removed. LUNGS AND  PLEURA: Persistent low lung volumes. Patchy opacity at left lung base. Possible small left pleural effusion. No pneumothorax. HEART AND MEDIASTINUM: Status post median sternotomy for aortic valve replacement and left atrial appendage clipping. No acute abnormality of the cardiac and mediastinal silhouettes. BONES AND SOFT TISSUES: No acute osseous abnormality. IMPRESSION: 1. Persistent low lung volumes with patchy left basilar opacity and probable small left pleural effusion. 2. Stable right internal jugular central venous catheter and bilateral chest tubes. No pneumothorax visualized. Electronically signed by: Norleen Kil MD 07/02/2024 06:22 AM EDT RP Workstation: [id number]   DG Chest Port 1 View Result Date: 07/01/2024 EXAM: 1 VIEW(S) XRAY OF THE CHEST 07/01/2024 01:12:00 PM COMPARISON: 06/28/2024 CLINICAL HISTORY: S/P aortic valve replacement with bioprosthetic valve FINDINGS: LINES, TUBES AND DEVICES: Endotracheal tube in place with tip 5.4 cm above the carina. Right internal jugular central venous catheter in place with tip in the mid superior vena cava. Mediastinal chest tubes present. Interval aortic valve replacement. Left atrial appendage clip noted. LUNGS AND PLEURA: Low lung volumes. Patchy right perihilar opacity. Left basilar atelectasis. Small left pleural effusion. No pneumothorax. No pulmonary edema. HEART AND MEDIASTINUM: No acute abnormality of the cardiac and mediastinal silhouettes. BONES AND SOFT TISSUES: Interval sternotomy noted. IMPRESSION: 1. Postoperative changes consistent with interval aortic valve replacement and sternotomy. 2. Low lung volumes with patchy right perihilar opacity, left basilar atelectasis, and small left pleural effusion. Electronically signed by: Waddell Calk MD 07/01/2024 02:50 PM EDT RP Workstation: [id number]   ECHO INTRAOPERATIVE TEE Result Date: 07/01/2024  *INTRAOPERATIVE TRANSESOPHAGEAL REPORT *  Patient Name:   Johnathan Richmond Date of Exam: 07/01/2024  Medical Rec #:  [id number]        Height:       74.0 in Accession #:    [id number]       Weight:       270.0 lb Date of Birth:  02/03/1961       BSA:          2.47 m Patient Age:    62 years         BP:           138/123 mmHg Patient Gender: M                HR:           57 bpm. Exam Location:  Anesthesiology Transesophogeal exam was perform intraoperatively during surgical procedure. Patient was closely monitored under general anesthesia during the entirety of examination. Indications:     Aortic Valve Replacement Performing Phys: Bernardino Mango, MD Complications: No known complications during this procedure. POST-OP IMPRESSIONS _ Left Ventricle: The left ventricle is unchanged from pre-bypass. _ Right Ventricle: The right ventricle appears unchanged from pre-bypass. _ Aorta: The aorta appears unchanged from pre-bypass. _ Left Atrial Appendage: The left atrial appendage is not visualized. _ Aortic Valve: No  stenosis present. There is no regurgitation. The gradient recorded across the prosthetic valve is within the expected range. No perivalvular leak noted. _ Mitral Valve: The mitral valve appears unchanged from pre-bypass. _ Tricuspid Valve: The tricuspid valve appears unchanged from pre-bypass. _ Pulmonic Valve: The pulmonic valve appears unchanged from pre-bypass. _ Interatrial Septum: The interatrial septum appears unchanged from pre-bypass. _ Pericardium: The pericardium appears unchanged from pre-bypass. PRE-OP FINDINGS  Left Ventricle: The left ventricle has normal systolic function, with an ejection fraction of 60-65%. The cavity size was normal. There is moderately increased left ventricular wall thickness. There is moderate concentric left ventricular hypertrophy. Right Ventricle: The right ventricle has normal systolic function. The cavity was normal. There is no increase in right ventricular wall thickness. Left Atrium: Left atrial size was normal in size. No left atrial/left atrial appendage thrombus  was detected. Right Atrium: Right atrial size was normal in size. Interatrial Septum: No atrial level shunt detected by color flow Doppler. Pericardium: There is no evidence of pericardial effusion. Mitral Valve: The mitral valve is normal in structure. Mitral valve regurgitation is trivial by color flow Doppler. Tricuspid Valve: The tricuspid valve was normal in structure. Tricuspid valve regurgitation is trivial by color flow Doppler. Aortic Valve: The aortic valve is bicuspid. Aortic valve regurgitation is trivial by color flow Doppler. 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 There is moderate thickening and moderate calcification present on the aortic valve right coronary, left coronary and non-coronary cusps. Pulmonic Valve: The pulmonic valve was normal in structure. Pulmonic valve regurgitation is not visualized by color flow Doppler. Aorta: The aortic root, ascending aorta and aortic arch are normal in size and structure. +--------------+--------++ LEFT VENTRICLE         +--------------+--------++ PLAX 2D                +--------------+--------++ LVIDd:        4.90 cm  +--------------+--------++ LVIDs:        3.40 cm  +--------------+--------++ LVOT diam:    2.10 cm  +--------------+--------++ LV SV:        65 ml    +--------------+--------++ LV SV Index:  25.47    +--------------+--------++ LVOT Area:    3.46 cm +--------------+--------++                        +--------------+--------++ +-------------+------------++ AORTIC VALVE              +-------------+------------++ AV Vmax:     384.00 cm/s  +-------------+------------++ AV Vmean:    286.000 cm/s +-------------+------------++ AV VTI:      0.920 m      +-------------+------------++ AV Peak Grad:59.0 mmHg    +-------------+------------++ AV Mean Grad:37.0 mmHg    +-------------+------------++  +-------------+-------++ AORTA                +-------------+-------++ Ao Root diam:3.60  cm +-------------+-------++ Ao Asc diam: 3.70 cm +-------------+-------++  +--------------+-------+ SHUNTS                +--------------+-------+ Systemic Diam:2.10 cm +--------------+-------+  Bernardino Mango MD Electronically signed by Bernardino Mango MD Signature Date/Time: 07/01/2024/1:11:52 PM    Final     Assessment/Plan: S/P Procedure(s) (LRB): REPLACEMENT, AORTIC VALVE, OPEN WITH INSPIRIS VALVE SIZE (N/A) ECHOCARDIOGRAM, TRANSESOPHAGEAL (N/A) CLIPPING, LEFT ATRIAL APPENDAGE USING  ATRICURE CLIP SIZE 45 (N/A) POD#1  1 afeb, hemodyn stable , gtts- Levo, EKG - NSR w/RBBB 2 O2 sats good on 4 l 3 wt-  up approx 3 kg >preop 4 good UOP- normal renal fxn 5 CT 530 ml- keep for now 6 BS well controlled- routine protocols 7 minor reactive leukocytosis- observe 8 minor reactive thrombocytopenia- observe 9 expected ABLA- monitor- not at transfusion threshold 10 CXR- left basilar opacity/ small effus 11 routine pulm hygiene, cardiac rehab modalities   LOS: 1 day    Lemond FORBES Cera PA-C 07/02/2024   POD1 progression  Rolonda Pontarelli O Tashanti Dalporto

## 2024-07-03 ENCOUNTER — Inpatient Hospital Stay (HOSPITAL_COMMUNITY)

## 2024-07-03 DIAGNOSIS — I4891 Unspecified atrial fibrillation: Secondary | ICD-10-CM

## 2024-07-03 DIAGNOSIS — I4892 Unspecified atrial flutter: Secondary | ICD-10-CM

## 2024-07-03 LAB — CBC
HCT: 34.1 % — ABNORMAL LOW (ref 39.0–52.0)
Hemoglobin: 11.5 g/dL — ABNORMAL LOW (ref 13.0–17.0)
MCH: 27.8 pg (ref 26.0–34.0)
MCHC: 33.7 g/dL (ref 30.0–36.0)
MCV: 82.6 fL (ref 80.0–100.0)
Platelets: 122 K/uL — ABNORMAL LOW (ref 150–400)
RBC: 4.13 MIL/uL — ABNORMAL LOW (ref 4.22–5.81)
RDW: 14.2 % (ref 11.5–15.5)
WBC: 11.9 K/uL — ABNORMAL HIGH (ref 4.0–10.5)
nRBC: 0 % (ref 0.0–0.2)

## 2024-07-03 LAB — GLUCOSE, CAPILLARY
Glucose-Capillary: 152 mg/dL — ABNORMAL HIGH (ref 70–99)
Glucose-Capillary: 165 mg/dL — ABNORMAL HIGH (ref 70–99)
Glucose-Capillary: 170 mg/dL — ABNORMAL HIGH (ref 70–99)
Glucose-Capillary: 182 mg/dL — ABNORMAL HIGH (ref 70–99)
Glucose-Capillary: 128 mg/dL — ABNORMAL HIGH (ref 70–99)
Glucose-Capillary: 128 mg/dL — ABNORMAL HIGH (ref 70–99)

## 2024-07-03 LAB — BASIC METABOLIC PANEL WITH GFR
Anion gap: 8 (ref 5–15)
BUN: 10 mg/dL (ref 8–23)
CO2: 21 mmol/L — ABNORMAL LOW (ref 22–32)
Calcium: 7.8 mg/dL — ABNORMAL LOW (ref 8.9–10.3)
Chloride: 104 mmol/L (ref 98–111)
Creatinine, Ser: 0.86 mg/dL (ref 0.61–1.24)
GFR, Estimated: >60 mL/min (ref >60)
Glucose, Bld: 146 mg/dL — ABNORMAL HIGH (ref 70–99)
Potassium: 4.1 mmol/L (ref 3.5–5.1)
Sodium: 133 mmol/L — ABNORMAL LOW (ref 135–145)

## 2024-07-03 LAB — MAGNESIUM: Magnesium: 1.9 mg/dL (ref 1.7–2.4)

## 2024-07-03 LAB — PROTIME-INR
INR: 1.3 — ABNORMAL HIGH (ref 0.8–1.2)
Prothrombin Time: 16.4 s — ABNORMAL HIGH (ref 11.4–15.2)

## 2024-07-03 MED ORDER — AMIODARONE LOAD VIA INFUSION
150.0000 mg | Freq: Once | INTRAVENOUS | Status: AC
  Administered 2024-07-03: 150 mg via INTRAVENOUS
  Filled 2024-07-03: qty 83.34

## 2024-07-03 MED ORDER — FUROSEMIDE 40 MG PO TABS
40.0000 mg | ORAL_TABLET | Freq: Every day | ORAL | Status: DC
  Administered 2024-07-03 – 2024-07-06 (×4): 40 mg via ORAL
  Filled 2024-07-03 (×4): qty 1

## 2024-07-03 MED ORDER — AMIODARONE HCL IN DEXTROSE 360-4.14 MG/200ML-% IV SOLN
30.0000 mg/h | INTRAVENOUS | Status: DC
  Administered 2024-07-03 – 2024-07-04 (×2): 30 mg/h via INTRAVENOUS
  Filled 2024-07-03 (×2): qty 200

## 2024-07-03 MED ORDER — INSULIN ASPART 100 UNIT/ML IJ SOLN
0.0000 [IU] | Freq: Three times a day (TID) | INTRAMUSCULAR | Status: DC
  Administered 2024-07-03: 4 [IU] via SUBCUTANEOUS
  Administered 2024-07-03 – 2024-07-05 (×4): 2 [IU] via SUBCUTANEOUS

## 2024-07-03 MED ORDER — AMIODARONE HCL IN DEXTROSE 360-4.14 MG/200ML-% IV SOLN
60.0000 mg/h | INTRAVENOUS | Status: AC
  Administered 2024-07-03 (×2): 60 mg/h via INTRAVENOUS
  Filled 2024-07-03: qty 200

## 2024-07-03 MED ORDER — MAGNESIUM SULFATE 2 GM/50ML IV SOLN
2.0000 g | Freq: Once | INTRAVENOUS | Status: AC
  Administered 2024-07-03: 2 g via INTRAVENOUS
  Filled 2024-07-03: qty 50

## 2024-07-03 MED ORDER — AMIODARONE HCL IN DEXTROSE 360-4.14 MG/200ML-% IV SOLN
INTRAVENOUS | Status: AC
  Filled 2024-07-03: qty 200

## 2024-07-03 MED ORDER — POTASSIUM CHLORIDE CRYS ER 20 MEQ PO TBCR
40.0000 meq | EXTENDED_RELEASE_TABLET | Freq: Every day | ORAL | Status: DC
  Administered 2024-07-03 – 2024-07-06 (×4): 40 meq via ORAL
  Filled 2024-07-03 (×4): qty 2

## 2024-07-03 NOTE — Progress Notes (Signed)
 Physical Therapy Treatment Patient Details Name: Johnathan Richmond MRN: [id number] DOB: 1960/11/10 Today's Date: 07/03/2024   History of Present Illness 63 y.o. male admitted 10/27 for and underwent elective aortic valve replacement, and Left atrial appendage clipping.  Past medical history of DM, and paroxysmal atrial flutter.    PT Comments  Pt is presenting at [facility] - Mod A for bed mobility, Min A for sit to stand and CGA for long distance gait. Pt able to ambulate without an AD at [facility] with with a heavy fall on one LE but unable to fully assess due to managing lines/leads and CGA for pt during gait with poor visibility to adequately assess. Pt currently at risk for falls. Due to pt current functional status, home set up and available assistance at home recommending skilled physical therapy services > 3 hours/day in order to address strength, balance and functional mobility to decrease risk for falls, injury, immobility, skin break down and re-hospitalization.      If plan is discharge home, recommend the following: Assistance with cooking/housework;Assist for transportation     Equipment Recommendations  None recommended by PT       Precautions / Restrictions Precautions Precautions: Sternal Precaution Booklet Issued: No Recall of Precautions/Restrictions: Intact Restrictions Weight Bearing Restrictions Per Provider Order: Yes RUE Weight Bearing Per Provider Order: Non weight bearing LUE Weight Bearing Per Provider Order: Non weight bearing Other Position/Activity Restrictions: cardiac sternal precautions     Mobility  Bed Mobility Overal bed mobility: Needs Assistance Bed Mobility: Sit to Supine       Sit to supine: Min assist, Mod assist   General bed mobility comments: Min A at LE and Mod A at trunk to get to supine from sitting.    Transfers Overall transfer level: Needs assistance Equipment used: None Transfers: Sit to/from Stand Sit to Stand: Min assist            General transfer comment: Min A for initial momentum with rocking to get to standing from sitting. Good re-call of technique with heart pillow for comfort/re-call.    Ambulation/Gait Ambulation/Gait assistance: Contact guard assist Gait Distance (Feet): 500 Feet Assistive device: None Gait Pattern/deviations: Step-through pattern, Decreased stride length, Trunk flexed, Narrow base of support Gait velocity: decreased Gait velocity interpretation: <1.8 ft/sec, indicate of risk for recurrent falls   General Gait Details: no standing rest breaks. Pt slightly antalgic gait unable to fully assess due to managing multiple lines/leads on pt with CGA for safety     Balance Overall balance assessment: Needs assistance Sitting-balance support: No upper extremity supported, Feet supported Sitting balance-Leahy Scale: Fair     Standing balance support: No upper extremity supported Standing balance-Leahy Scale: Fair Standing balance comment: CGA without UE support      Communication Communication Communication: No apparent difficulties  Cognition Arousal: Alert Behavior During Therapy: WFL for tasks assessed/performed   PT - Cognitive impairments: No apparent impairments     Following commands: Intact      Cueing Cueing Techniques: Verbal cues, Gestural cues     General Comments General comments (skin integrity, edema, etc.): VItal signs stable on room air.      Pertinent Vitals/Pain Pain Assessment Pain Assessment: Faces Faces Pain Scale: Hurts a little bit Facial Expression: Relaxed, neutral Body Movements: Absence of movements Muscle Tension: Relaxed Compliance with ventilator (intubated pts.): N/A Vocalization (extubated pts.): Talking in normal tone or no sound CPOT Total: 0 Pain Location: sternum Pain Descriptors / Indicators: Aching Pain Intervention(s): Monitored  during session     PT Goals (current goals can now be found in the care plan section) Acute Rehab  PT Goals Patient Stated Goal: Get stronger PT Goal Formulation: With patient Time For Goal Achievement: 07/16/24 Potential to Achieve Goals: Good Progress towards PT goals: Progressing toward goals    Frequency    Min 3X/week      PT Plan  Continue with current POC        AM-PAC PT 6 Clicks Mobility   Outcome Measure  Help needed turning from your back to your side while in a flat bed without using bedrails?: A Little Help needed moving from lying on your back to sitting on the side of a flat bed without using bedrails?: A Lot Help needed moving to and from a bed to a chair (including a wheelchair)?: A Little Help needed standing up from a chair using your arms (e.g., wheelchair or bedside chair)?: A Little Help needed to walk in hospital room?: A Little Help needed climbing 3-5 steps with a railing? : A Lot 6 Click Score: 16    End of Session Equipment Utilized During Treatment: Oxygen;Gait belt (by axilla) Activity Tolerance: Patient tolerated treatment well Patient left: in bed;with call bell/phone within reach;with bed alarm set Nurse Communication: Mobility status PT Visit Diagnosis: Unsteadiness on feet (R26.81);Other abnormalities of gait and mobility (R26.89);Muscle weakness (generalized) (M62.81);Difficulty in walking, not elsewhere classified (R26.2);Pain Pain - part of body:  (sternum)     Time: 1414-1440 PT Time Calculation (min) (ACUTE ONLY): 26 min  Charges:    $Therapeutic Activity: 23-37 mins PT General Charges $$ ACUTE PT VISIT: 1 Visit                     Johnathan Richmond, DPT, CLT  Acute Rehabilitation Services Office: [phone] (Secure chat preferred)    Johnathan Richmond 07/03/2024, 2:59 PM

## 2024-07-03 NOTE — TOC Initial Note (Signed)
 Transition of Care [facility]) - Initial/Assessment Note    Patient Details  Name: Johnathan Richmond MRN: [id number] Date of Birth: February 11, 1961  [facility] CM/SW Contact:    Justina Delcia Czar, RN Phone Number: [phone] 07/03/2024, 9:21 AM  Clinical Narrative:                  Spoke to pt and states he recently retired. Pt states he was independently pta. Brother lives in home. Pt drives to appts. Pt states he wants to wait on making decision about IP rehab or SNF rehab. States he does not know if he can do tolerate IP rehab. Will continue to monitor for dc needs.    Expected Discharge Plan: Home/Self Care Barriers to Discharge: Continued Medical Work up   Patient Goals and CMS Choice Patient states their goals for this hospitalization and ongoing recovery are:: wants to get better          Expected Discharge Plan and Services   Discharge Planning Services: CM Consult   Living arrangements for the past 2 months: Apartment                                      Prior Living Arrangements/Services Living arrangements for the past 2 months: Apartment Lives with:: Siblings Patient language and need for interpreter reviewed:: Yes Do you feel safe going back to the place where you live?: Yes      Need for Family Participation in Patient Care: Yes (Comment) Care giver support system in place?: Yes (comment)   Criminal Activity/Legal Involvement Pertinent to Current Situation/Hospitalization: No - Comment as needed  Activities of Daily Living      Permission Sought/Granted Permission sought to share information with : Case Manager, PCP, Family Supports Permission granted to share information with : Yes, Verbal Permission Granted  Share Information with NAME: Maijor Hornig  Permission granted to share info w AGENCY: PCP, DME  Permission granted to share info w Relationship: brother  Permission granted to share info w Contact Information:  [phone]  Emotional Assessment Appearance:: Appears stated age Attitude/Demeanor/Rapport: Engaged Affect (typically observed): Accepting Orientation: : Oriented to Self, Oriented to Place, Oriented to  Time, Oriented to Situation   Psych Involvement: No (comment)  Admission diagnosis:  Severe aortic stenosis [I35.0] S/P aortic valve replacement with bioprosthetic valve [Z95.3] Patient Active Problem List   Diagnosis Date Noted   S/P aortic valve replacement with bioprosthetic valve 07/01/2024   Severe aortic stenosis 06/21/2024   Encounter for therapeutic drug monitoring 05/22/2024   Long term (current) use of anticoagulants 05/01/2024   Aortic stenosis 08/29/2019   History of atrial flutter 08/29/2019   Subclinical hypothyroidism 08/29/2019   Essential hypertension 08/29/2019   Type 2 diabetes mellitus (HCC) 08/29/2019   PCP:  Mancil Pfeiffer, PA-C Pharmacy:   [facility] [phone] - [place], [place] - [address] AT [facility] OF [address] [place] [address] Phone: [phone] Fax: [phone]  [id number] - [place], [place] - [address] AT [facility] OF [address] [place] [address] Phone: [phone] Fax: [phone]  [facility] [address] [place] [address] Phone: [phone] Fax: [phone]     Social Drivers of Health (SDOH) Social History: SDOH Screenings   Food Insecurity: No Food  Insecurity (05/31/2024)  Housing: Low Risk  (05/31/2024)  Transportation Needs: No Transportation Needs (05/31/2024)  Utilities: Not At Risk (05/31/2024)  Depression (PHQ2-9): Low Risk  (04/02/2024)  Financial Resource Strain: Medium Risk (05/31/2024)  Tobacco Use: Low Risk  (07/01/2024)  Health Literacy: Adequate Health Literacy (05/31/2024)   SDOH Interventions:     Readmission Risk Interventions     No data to display

## 2024-07-03 NOTE — Progress Notes (Signed)
 [address]       [name] [address]             [phone]                 2 Days Post-Op Procedure(s) (LRB): REPLACEMENT, AORTIC VALVE, OPEN WITH INSPIRIS VALVE SIZE (N/A) ECHOCARDIOGRAM, TRANSESOPHAGEAL (N/A) CLIPPING, LEFT ATRIAL APPENDAGE USING  ATRICURE CLIP SIZE 45 (N/A)   Events: Arrhythmia this am _______________________________________________________________ Vitals: BP (!) 122/100   Pulse (!) 106   Temp 98.6 F (37 C) (Axillary)   Resp 20   Ht 6' 2 (1.88 m)   Wt 125 kg   SpO2 93%   BMI 35.38 kg/m  Filed Weights   07/01/24 0547 07/02/24 0500 07/03/24 0500  Weight: 122.5 kg 125.6 kg 125 kg     - Neuro: alert NAD  - Cardiovascular: Tachy  Drips: none.   CVP:  [3 mmHg-13 mmHg] 8 mmHg CO:  [8.3 L/min-9.4 L/min] 8.9 L/min CI:  [3.4 L/min/m2-3.8 L/min/m2] 3.6 L/min/m2  - Pulm: EWOB    ABG    Component Value Date/Time   PHART 7.353 07/01/2024 1249   PCO2ART 38.9 07/01/2024 1249   PO2ART 138 (H) 07/01/2024 1249   HCO3 21.5 07/01/2024 1249   TCO2 23 07/01/2024 1249   ACIDBASEDEF 4.0 (H) 07/01/2024 1249   O2SAT 99 07/01/2024 1249    - Abd: Nd - Extremity: trace edema  .Intake/Output      10/28 0701 10/29 0700 10/29 0701 10/30 0700   P.O.     I.V. (mL/kg) 160.8 (1.3)    Blood     IV Piggyback 300    Total Intake(mL/kg) 460.8 (3.7)    Urine (mL/kg/hr) 350 (0.1)    Stool 0    Chest Tube 200    Total Output 550    Net -89.2         Stool Occurrence 3 x       _______________________________________________________________ Labs:    Latest Ref Rng & Units 07/03/2024    4:11 AM 07/02/2024    4:44 PM 07/02/2024    5:00 AM  CBC  WBC 4.0 - 10.5 K/uL 11.9  14.6  12.5   Hemoglobin 13.0 - 17.0 g/dL 88.4  87.1  88.3   Hematocrit 39.0 - 52.0 % 34.1  38.4  33.6   Platelets 150 - 400 K/uL 122  134  139       Latest Ref Rng & Units 07/03/2024    4:11 AM 07/02/2024    4:44 PM 07/02/2024    5:00 AM  CMP   Glucose 70 - 99 mg/dL 853  834  855   BUN 8 - 23 mg/dL 10  12  10    Creatinine 0.61 - 1.24 mg/dL 9.13  8.92  8.94   Sodium 135 - 145 mmol/L 133  134  135   Potassium 3.5 - 5.1 mmol/L 4.1  4.4  3.9   Chloride 98 - 111 mmol/L 104  101  108   CO2 22 - 32 mmol/L 21  22  21    Calcium 8.9 - 10.3 mg/dL 7.8  8.1  7.5     CXR: PV congestion  _______________________________________________________________  Assessment and Plan: POD 2 s/p AVR, atriclip  Neuro: pain controlled CV: formal EKG.  Will start amio.  Hx of aflutter.   Pulm: IS, ambulation Renal: creat stable.  Will diurese GI: on diet Heme: stable ID: afebrile Endo: SSI Dispo: continue ICU  care.   Johnathan Richmond 07/03/2024 7:56 AM

## 2024-07-03 NOTE — Progress Notes (Addendum)
 NAME:  Johnathan Richmond, MRN:  [id number], DOB:  Oct 20, 1960, LOS: 2 ADMISSION DATE:  07/01/2024, CONSULTATION DATE:  10/27 REFERRING MD:  Shyrl, CHIEF COMPLAINT:  post-cardiac surgery ICU care   History of Present Illness:  63 year old male w/ h/o bicuspid aortic valve and severe AS. Initially dx'd as moderate in 2020. Seen in 2025 after being lost to follow-up w/ new onset exertional dyspnea and chest pain in Aug. At this time ECHO showed EF 65-70%, mild MR, prob bicuspid aorta, and severe AS w/ mean gradient 39.5 mmHg. Left heart cath completed 10/2 nml CAs. Presented to [facility] 10/27 for elective AVR w/ left atrial appendage clipping   OR Course  Bypass time: 9:18-11:21 (2hrs71min) Clamp time: [id number] (1hr26 min) Cell saver: 740 EBL 1100 CT output on arrival to ICU: 0   Pertinent  Medical History  AS, PAF, DM, HTN, hypothyroidism, MO Never smoked Significant Hospital Events: Including procedures, antibiotic start and stop dates in addition to other pertinent events   10/27 to OR for AVR 10/27 - Extubated 10/29 Afib   Interim History / Subjective:  Variable rhythm and rate this morning, some rate controlled fib, then sinus tach   Off pressors On Room air  Labs are overall stable -- hgb and plt down slightly to 11.5 and 122 respectively   Objective    Blood pressure 124/85, pulse 91, temperature 98.6 F (37 C), temperature source Axillary, resp. rate 16, height 6' 2 (1.88 m), weight 125 kg, SpO2 95%. CVP:  [4 mmHg-13 mmHg] 8 mmHg CO:  [8.4 L/min-9.4 L/min] 8.9 L/min CI:  [3.4 L/min/m2-3.8 L/min/m2] 3.6 L/min/m2      Intake/Output Summary (Last 24 hours) at 07/03/2024 0924 Last data filed at 07/03/2024 0900 Gross per 24 hour  Intake 655.03 ml  Output 580 ml  Net 75.03 ml   Filed Weights   07/01/24 0547 07/02/24 0500 07/03/24 0500  Weight: 122.5 kg 125.6 kg 125 kg    Examination: General: wdwn middle aged M NAD  Neuro:AAOx4 no focal deficit  HENT: NCAT pink  mm  Lungs: CTAb even unlabored on RA  Cardiovascular:  irir. Cap refill brisk. Chest tube.   Abdomen: soft round  Extremities: no obvious acute joint deformity Skin. C/d/w. Sternal dressing intact.    Resolved problem list  Endotracheally intubated    Assessment and Plan   Severe AS s/p AVR  Hx Aflutter, s/p LAA clipping Afib, variable rate  Expected post op ABLA, thrombocytopenia  Hx pAF  IDDM  Hyponatremia  -rhythm and rate variable 10/29 -- some sinus tach, some a fib w variable rate.  -cxr 10/29 personally reviewed. Looks a little congested. Formal read pending  P -post op per CVTS   -amio bolus and gtt -EKG -multimodal analgesia -mobility, pulm hygiene  - glargine 14u / day + SSI -metop  -lasix  -add on a mag  -will d/w cvts this afternoon re consideration of systemic AC  -not at a transfusion threshold; not clinically significant anemia at this point   Labs   CBC: Recent Labs  Lab 07/01/24 1318 07/01/24 1855 07/02/24 0500 07/02/24 1644 07/03/24 0411  WBC 15.6* 11.5* 12.5* 14.6* 11.9*  HGB 11.7* 12.6* 11.6* 12.8* 11.5*  HCT 34.3* 37.1* 33.6* 38.4* 34.1*  MCV 82.1 81.5 82.4 83.1 82.6  PLT 138* 139* 139* 134* 122*    Basic Metabolic Panel: Recent Labs  Lab 06/28/24 1047 07/01/24 0807 07/01/24 1139 07/01/24 1249 07/01/24 1855 07/02/24 0500 07/02/24 1644 07/03/24 0411  NA 137   < > 138 137 138 135 134* 133*  K 4.3   < > 4.5 4.2 4.3 3.9 4.4 4.1  CL 103   < > 104  --  109 108 101 104  CO2 27  --   --   --  20* 21* 22 21*  GLUCOSE 156*   < > 126*  --  130* 144* 165* 146*  BUN 9   < > 11  --  11 10 12 10   CREATININE 0.95   < > 0.90  --  1.04 1.05 1.07 0.86  CALCIUM 8.9  --   --   --  7.9* 7.5* 8.1* 7.8*  MG  --   --   --   --  3.0* 2.1 2.3  --    < > = values in this interval not displayed.   GFR: Estimated Creatinine Clearance: 121.9 mL/min (by C-G formula based on SCr of 0.86 mg/dL). Recent Labs  Lab 07/01/24 1338 07/01/24 1535  07/01/24 1855 07/02/24 0500 07/02/24 1644 07/03/24 0411  WBC  --   --  11.5* 12.5* 14.6* 11.9*  LATICACIDVEN 1.3 0.9  --   --   --   --     Liver Function Tests: Recent Labs  Lab 06/28/24 1047  AST 17  ALT 19  ALKPHOS 62  BILITOT 0.9  PROT 7.3  ALBUMIN 3.7   No results for input(s): LIPASE, AMYLASE in the last 168 hours. No results for input(s): AMMONIA in the last 168 hours.  ABG    Component Value Date/Time   PHART 7.353 07/01/2024 1249   PCO2ART 38.9 07/01/2024 1249   PO2ART 138 (H) 07/01/2024 1249   HCO3 21.5 07/01/2024 1249   TCO2 23 07/01/2024 1249   ACIDBASEDEF 4.0 (H) 07/01/2024 1249   O2SAT 99 07/01/2024 1249     Coagulation Profile: Recent Labs  Lab 06/28/24 1047 07/01/24 1318  INR 0.9 1.4*    Cardiac Enzymes: No results for input(s): CKTOTAL, CKMB, CKMBINDEX, TROPONINI in the last 168 hours.  HbA1C: Hgb A1c MFr Bld  Date/Time Value Ref Range Status  06/28/2024 10:47 AM 6.5 (H) 4.8 - 5.6 % Final    Comment:    (NOTE) Diagnosis of Diabetes The following HbA1c ranges recommended by the American Diabetes Association (ADA) may be used as an aid in the diagnosis of diabetes mellitus.  Hemoglobin             Suggested A1C NGSP%              Diagnosis  <5.7                   Non Diabetic  5.7-6.4                Pre-Diabetic  >6.4                   Diabetic  <7.0                   Glycemic control for                       adults with diabetes.    04/02/2024 09:24 AM 7.4 (H) 4.8 - 5.6 % Final    Comment:             Prediabetes: 5.7 - 6.4          Diabetes: >6.4  Glycemic control for adults with diabetes: <7.0     CBG: Recent Labs  Lab 07/02/24 1612 07/02/24 2006 07/02/24 2352 07/03/24 0333 07/03/24 0801  GLUCAP 145* 142* 146* 165* 170*    Mod MDM    Ronnald Gave MSN, AGACNP-BC [facility] Pulmonary/Critical Care Medicine Amion for pager  07/03/2024, 9:24 AM

## 2024-07-03 NOTE — Progress Notes (Signed)
 RT NOTE: patient resting comfortably with no respiratory distress noted this AM.  Bipap ordered for PRN.  Not indicated at this time.  Will continue to monitor and assess for bipap needs.

## 2024-07-04 DIAGNOSIS — I480 Paroxysmal atrial fibrillation: Secondary | ICD-10-CM

## 2024-07-04 LAB — GLUCOSE, CAPILLARY
Glucose-Capillary: 130 mg/dL — ABNORMAL HIGH (ref 70–99)
Glucose-Capillary: 124 mg/dL — ABNORMAL HIGH (ref 70–99)
Glucose-Capillary: 109 mg/dL — ABNORMAL HIGH (ref 70–99)
Glucose-Capillary: 150 mg/dL — ABNORMAL HIGH (ref 70–99)

## 2024-07-04 LAB — BASIC METABOLIC PANEL WITH GFR
Anion gap: 11 (ref 5–15)
BUN: 9 mg/dL (ref 8–23)
CO2: 24 mmol/L (ref 22–32)
Calcium: 8 mg/dL — ABNORMAL LOW (ref 8.9–10.3)
Chloride: 101 mmol/L (ref 98–111)
Creatinine, Ser: 0.8 mg/dL (ref 0.61–1.24)
GFR, Estimated: >60 mL/min (ref >60)
Glucose, Bld: 133 mg/dL — ABNORMAL HIGH (ref 70–99)
Potassium: 3.9 mmol/L (ref 3.5–5.1)
Sodium: 136 mmol/L (ref 135–145)

## 2024-07-04 LAB — CBC
HCT: 33.1 % — ABNORMAL LOW (ref 39.0–52.0)
Hemoglobin: 11.1 g/dL — ABNORMAL LOW (ref 13.0–17.0)
MCH: 27.5 pg (ref 26.0–34.0)
MCHC: 33.5 g/dL (ref 30.0–36.0)
MCV: 82.1 fL (ref 80.0–100.0)
Platelets: 140 K/uL — ABNORMAL LOW (ref 150–400)
RBC: 4.03 MIL/uL — ABNORMAL LOW (ref 4.22–5.81)
RDW: 14.1 % (ref 11.5–15.5)
WBC: 9.9 K/uL (ref 4.0–10.5)
nRBC: 0 % (ref 0.0–0.2)

## 2024-07-04 LAB — MAGNESIUM: Magnesium: 2.2 mg/dL (ref 1.7–2.4)

## 2024-07-04 MED ORDER — SODIUM CHLORIDE 0.9 % IV SOLN: 250.0000 mL | INTRAVENOUS | Status: DC | PRN

## 2024-07-04 MED ORDER — AMIODARONE HCL 200 MG PO TABS
200.0000 mg | ORAL_TABLET | Freq: Two times a day (BID) | ORAL | Status: DC
  Administered 2024-07-04 – 2024-07-06 (×5): 200 mg via ORAL
  Filled 2024-07-04 (×5): qty 1

## 2024-07-04 MED ORDER — INSULIN ASPART 100 UNIT/ML IJ SOLN: 0.0000 [IU] | Freq: Three times a day (TID) | INTRAMUSCULAR | Status: DC

## 2024-07-04 MED ORDER — SODIUM CHLORIDE 0.9% FLUSH: 3.0000 mL | Freq: Two times a day (BID) | INTRAVENOUS | Status: DC

## 2024-07-04 MED ORDER — FENTANYL CITRATE (PF) 50 MCG/ML IJ SOSY
25.0000 ug | PREFILLED_SYRINGE | Freq: Once | INTRAMUSCULAR | Status: AC
  Administered 2024-07-04: 25 ug via INTRAVENOUS
  Filled 2024-07-04: qty 1

## 2024-07-04 MED ORDER — SODIUM CHLORIDE 0.9% FLUSH: 3.0000 mL | INTRAVENOUS | Status: DC | PRN

## 2024-07-04 MED ORDER — ~~LOC~~ CARDIAC SURGERY, PATIENT & FAMILY EDUCATION: Freq: Once | Status: AC

## 2024-07-04 NOTE — Discharge Summary (Signed)
 [address] [place] [address]             [phone]        Physician Discharge Summary  Patient ID: [name] MRN: [id number] DOB/AGE: Jun 20, 1961 63 y.o.  Admit date: 07/01/2024 Discharge date: 07/06/2024  Admission Diagnoses:  Patient Active Problem List   Diagnosis Date Noted   Paroxysmal atrial fibrillation (HCC) 07/04/2024   Severe aortic stenosis 06/21/2024   Encounter for therapeutic drug monitoring 05/22/2024   Long term (current) use of anticoagulants 05/01/2024   Aortic stenosis 08/29/2019   History of atrial flutter 08/29/2019   Subclinical hypothyroidism 08/29/2019   Essential hypertension 08/29/2019   Type 2 diabetes mellitus (HCC) 08/29/2019   Discharge Diagnoses:  Patient Active Problem List   Diagnosis Date Noted   Paroxysmal atrial fibrillation (HCC) 07/04/2024   S/P aortic valve replacement with bioprosthetic valve 07/01/2024   Severe aortic stenosis 06/21/2024   Encounter for therapeutic drug monitoring 05/22/2024   Long term (current) use of anticoagulants 05/01/2024   Aortic stenosis 08/29/2019   History of atrial flutter 08/29/2019   Subclinical hypothyroidism 08/29/2019   Essential hypertension 08/29/2019   Type 2 diabetes mellitus (HCC) 08/29/2019   Discharged Condition: good   Referring: Verlin Lonni BIRCH* Primary Care: G[place], [place], [place] Primary Cardiologist:Vishnu SHAUNNA Maywood, MD   History of Present Illness:    At the time of CT surgical evaluation Johnathan Richmond is a 63 y.o. male who presents for surgical evaluation of severe aortic stenosis.  He has a history of DM, and paroxysmal atrial flutter.  He has been symptomatic with shortness of breath.  He denies any chest pain, or presyncope.  He was found on echocardiogram to have a bicuspid aortic valve with mild to moderate AI and severe aortic stenosis.  He was admitted this hospitalization for aortic valve replacement and clipping  of the left atrial appendage.  Hospital course: The patient was admitted electively on 07/01/2024 and taken the operating room at which time he underwent aortic valve replacement with #25 Inspiris Resilia bioprosthetic valve.  Additionally the left atrial appendage was clipped with a an atrial cure size 45.  He tolerated the procedure well and was taken to the surgical intensive care unit in stable condition.  Postoperative hospital course:  Patient was extubated using standard postcardiac surgical protocols without difficulty.  He initially did require some Levophed for hemodynamic support but this was able to be weaned fairly quickly.  Diabetes was managed with usual protocols.  He will be transition to home regimen by discharge.  He does not have a expected acute blood loss anemia which is being monitored clinically and has not required transfusion.  Renal function has remained within normal limits.  On postop day 2 he developed atrial flutter/fibrillation and was placed on intravenous amiodarone protocol.  He is subsequently chemically cardioverted to sinus rhythm.  He was transition to oral amiodarone dosing.  All routine lines, monitors and drainage devices have been discontinued in the standard fashion.  He required a routine diuresis for expected volume overload related to surgery.  Renal function has remained within normal limits.  Oxygen has been weaned and he maintains good saturations on room air.  Incisions are healing well without evidence of infection.  He is tolerating diet and routine cardiac rehab modalities.  Overall, at the time of discharge the patient is felt to be quite stable.    Consults:  pulmonary/intensive care  Significant Diagnostic Studies:  DG Chest Port 1 View Result Date: 07/03/2024 EXAM: 1 VIEW(S) XRAY OF THE CHEST 07/03/2024 05:19:00 AM COMPARISON: 07/02/2024 CLINICAL HISTORY: pneumothorax FINDINGS: LINES, TUBES AND DEVICES: Right IJ central venous catheter with tip  in high SVC, unchanged. Stable mediastinal drains. LUNGS AND PLEURA: Small left pleural effusion, unchanged. Left basilar opacities, unchanged. No pulmonary edema. No pneumothorax. HEART AND MEDIASTINUM: Cardiac valve replacement and left atrial appendage clip noted. No acute abnormality of the cardiac and mediastinal silhouettes. BONES AND SOFT TISSUES: Median sternotomy noted. No acute osseous abnormality. IMPRESSION: 1. No acute cardiopulmonary process identified. 2. Unchanged small left pleural effusion and left basilar atelectasis/opacities. Electronically signed by: Katheleen Faes MD 07/03/2024 09:18 AM EDT RP Workstation: [id number]   DG Chest Port 1 View Result Date: 07/02/2024 EXAM: 1 VIEW XRAY OF THE CHEST 07/02/2024 05:24:00 AM COMPARISON: 07/01/2024 CLINICAL HISTORY: S/P aortic valve replacement with bioprosthetic valve FINDINGS: LINES, TUBES AND DEVICES: Stable right internal jugular central venous catheter and bilateral chest tubes. Endotracheal tube removed. LUNGS AND PLEURA: Persistent low lung volumes. Patchy opacity at left lung base. Possible small left pleural effusion. No pneumothorax. HEART AND MEDIASTINUM: Status post median sternotomy for aortic valve replacement and left atrial appendage clipping. No acute abnormality of the cardiac and mediastinal silhouettes. BONES AND SOFT TISSUES: No acute osseous abnormality. IMPRESSION: 1. Persistent low lung volumes with patchy left basilar opacity and probable small left pleural effusion. 2. Stable right internal jugular central venous catheter and bilateral chest tubes. No pneumothorax visualized. Electronically signed by: Norleen Kil MD 07/02/2024 06:22 AM EDT RP Workstation: [id number]   DG Chest Port 1 View Result Date: 07/01/2024 EXAM: 1 VIEW(S) XRAY OF THE CHEST 07/01/2024 01:12:00 PM COMPARISON: 06/28/2024 CLINICAL HISTORY: S/P aortic valve replacement with bioprosthetic valve FINDINGS: LINES, TUBES AND DEVICES: Endotracheal tube in  place with tip 5.4 cm above the carina. Right internal jugular central venous catheter in place with tip in the mid superior vena cava. Mediastinal chest tubes present. Interval aortic valve replacement. Left atrial appendage clip noted. LUNGS AND PLEURA: Low lung volumes. Patchy right perihilar opacity. Left basilar atelectasis. Small left pleural effusion. No pneumothorax. No pulmonary edema. HEART AND MEDIASTINUM: No acute abnormality of the cardiac and mediastinal silhouettes. BONES AND SOFT TISSUES: Interval sternotomy noted. IMPRESSION: 1. Postoperative changes consistent with interval aortic valve replacement and sternotomy. 2. Low lung volumes with patchy right perihilar opacity, left basilar atelectasis, and small left pleural effusion. Electronically signed by: Waddell Calk MD 07/01/2024 02:50 PM EDT RP Workstation: [id number]   ECHO INTRAOPERATIVE TEE Result Date: 07/01/2024  *INTRAOPERATIVE TRANSESOPHAGEAL REPORT *  Patient Name:   ELIGHA KMETZ Date of Exam: 07/01/2024 Medical Rec #:  [id number]        Height:       74.0 in Accession #:    [id number]       Weight:       270.0 lb Date of Birth:  [birth year]       BSA:          2.47 m Patient Age:    62 years         BP:           138/123 mmHg Patient Gender: M                HR:           57 bpm. Exam Location:  Anesthesiology Transesophogeal exam was perform intraoperatively during surgical procedure. Patient was  closely monitored under general anesthesia during the entirety of examination. Indications:     Aortic Valve Replacement Performing Phys: Bernardino Mango, MD Complications: No known complications during this procedure. POST-OP IMPRESSIONS _ Left Ventricle: The left ventricle is unchanged from pre-bypass. _ Right Ventricle: The right ventricle appears unchanged from pre-bypass. _ Aorta: The aorta appears unchanged from pre-bypass. _ Left Atrial Appendage: The left atrial appendage is not visualized. _ Aortic Valve: No stenosis present.  There is no regurgitation. The gradient recorded across the prosthetic valve is within the expected range. No perivalvular leak noted. _ Mitral Valve: The mitral valve appears unchanged from pre-bypass. _ Tricuspid Valve: The tricuspid valve appears unchanged from pre-bypass. _ Pulmonic Valve: The pulmonic valve appears unchanged from pre-bypass. _ Interatrial Septum: The interatrial septum appears unchanged from pre-bypass. _ Pericardium: The pericardium appears unchanged from pre-bypass. PRE-OP FINDINGS  Left Ventricle: The left ventricle has normal systolic function, with an ejection fraction of 60-65%. The cavity size was normal. There is moderately increased left ventricular wall thickness. There is moderate concentric left ventricular hypertrophy. Right Ventricle: The right ventricle has normal systolic function. The cavity was normal. There is no increase in right ventricular wall thickness. Left Atrium: Left atrial size was normal in size. No left atrial/left atrial appendage thrombus was detected. Right Atrium: Right atrial size was normal in size. Interatrial Septum: No atrial level shunt detected by color flow Doppler. Pericardium: There is no evidence of pericardial effusion. Mitral Valve: The mitral valve is normal in structure. Mitral valve regurgitation is trivial by color flow Doppler. Tricuspid Valve: The tricuspid valve was normal in structure. Tricuspid valve regurgitation is trivial by color flow Doppler. Aortic Valve: The aortic valve is bicuspid. Aortic valve regurgitation is trivial by color flow Doppler. 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 There is moderate thickening and moderate calcification present on the aortic valve right coronary, left coronary and non-coronary cusps. Pulmonic Valve: The pulmonic valve was normal in structure. Pulmonic valve regurgitation is not visualized by color flow Doppler. Aorta: The aortic root, ascending aorta and aortic arch are normal in size and  structure. +--------------+--------++ LEFT VENTRICLE         +--------------+--------++ PLAX 2D                +--------------+--------++ LVIDd:        4.90 cm  +--------------+--------++ LVIDs:        3.40 cm  +--------------+--------++ LVOT diam:    2.10 cm  +--------------+--------++ LV SV:        65 ml    +--------------+--------++ LV SV Index:  25.47    +--------------+--------++ LVOT Area:    3.46 cm +--------------+--------++                        +--------------+--------++ +-------------+------------++ AORTIC VALVE              +-------------+------------++ AV Vmax:     384.00 cm/s  +-------------+------------++ AV Vmean:    286.000 cm/s +-------------+------------++ AV VTI:      0.920 m      +-------------+------------++ AV Peak Grad:59.0 mmHg    +-------------+------------++ AV Mean Grad:37.0 mmHg    +-------------+------------++  +-------------+-------++ AORTA                +-------------+-------++ Ao Root diam:3.60 cm +-------------+-------++ Ao Asc diam: 3.70 cm +-------------+-------++  +--------------+-------+ SHUNTS                +--------------+-------+ Systemic  Diam:2.10 cm +--------------+-------+  Bernardino Mango MD Electronically signed by Bernardino Mango MD Signature Date/Time: 07/01/2024/1:11:52 PM    Final    VAS US  CAROTID Result Date: 06/28/2024 Carotid Arterial Duplex Study Patient Name:  GUINN DELAROSA  Date of Exam:   06/28/2024 Medical Rec #: [id number]         Accession #:    [id number] Date of Birth: Nov 24, 1960        Patient Gender: M Patient Age:   48 years Exam Location:  [facility] Procedure:      VAS US  CAROTID Referring Phys: HARRELL LIGHTFOOT --------------------------------------------------------------------------------  Indications:  Preop for Aortic valve replacement. Risk Factors: Hypertension, Diabetes, coronary artery disease. Performing Technologist: Ricka Sturdivant-Jones RDMS,  RVT  Examination Guidelines: A complete evaluation includes B-mode imaging, spectral Doppler, color Doppler, and power Doppler as needed of all accessible portions of each vessel. Bilateral testing is considered an integral part of a complete examination. Limited examinations for reoccurring indications may be performed as noted.  Right Carotid Findings: +----------+--------+--------+--------+------------------+------------------+           PSV cm/sEDV cm/sStenosisPlaque DescriptionComments           +----------+--------+--------+--------+------------------+------------------+ CCA Prox  83      22                                                   +----------+--------+--------+--------+------------------+------------------+ CCA Distal83      20                                intimal thickening +----------+--------+--------+--------+------------------+------------------+ ICA Prox  65      24      1-39%   hypoechoic                           +----------+--------+--------+--------+------------------+------------------+ ICA Distal76      22                                                   +----------+--------+--------+--------+------------------+------------------+ ECA       128     21                                                   +----------+--------+--------+--------+------------------+------------------+ +----------+--------+-------+----------------+-------------------+           PSV cm/sEDV cmsDescribe        Arm Pressure (mmHG) +----------+--------+-------+----------------+-------------------+ [id number]            Multiphasic, WNL                    +----------+--------+-------+----------------+-------------------+ +---------+--------+--+--------+--+---------+ VertebralPSV cm/s41EDV cm/s12Antegrade +---------+--------+--+--------+--+---------+  Left Carotid Findings: +----------+--------+--------+--------+------------------+--------+            PSV cm/sEDV cm/sStenosisPlaque DescriptionComments +----------+--------+--------+--------+------------------+--------+ CCA Prox  86      19                                         +----------+--------+--------+--------+------------------+--------+  CCA Distal75      18                                         +----------+--------+--------+--------+------------------+--------+ ICA Prox  77      25      1-39%   hypoechoic                 +----------+--------+--------+--------+------------------+--------+ ICA Distal86      30                                         +----------+--------+--------+--------+------------------+--------+ ECA       80      11                                         +----------+--------+--------+--------+------------------+--------+ +----------+--------+--------+----------------+-------------------+           PSV cm/sEDV cm/sDescribe        Arm Pressure (mmHG) +----------+--------+--------+----------------+-------------------+ Subclavian113             Multiphasic, WNL                    +----------+--------+--------+----------------+-------------------+ +---------+--------+--+--------+-+---------+ VertebralPSV cm/s27EDV cm/s7Antegrade +---------+--------+--+--------+-+---------+   Summary: Right Carotid: Velocities in the right ICA are consistent with a 1-39% stenosis. Left Carotid: Velocities in the left ICA are consistent with a 1-39% stenosis. Vertebrals:  Bilateral vertebral arteries demonstrate antegrade flow. Subclavians: Normal flow hemodynamics were seen in bilateral subclavian              arteries. *See table(s) above for measurements and observations.  Electronically signed by Fonda Rim on 06/28/2024 at 6:53:31 PM.    Final    DG Chest 2 View Result Date: 06/28/2024 CLINICAL DATA:  Nonrheumatic aortic valve stenosis. Bicuspid aortic valve. Paroxysmally atrial fibrillation. Preop. EXAM: DG CHEST 2V COMPARISON:  Chest  CT 06/12/2024 FINDINGS: The cardiomediastinal contours are normal. The lungs are clear. Pulmonary vasculature is normal. No consolidation, pleural effusion, or pneumothorax. No acute osseous abnormalities are seen. IMPRESSION: No active cardiopulmonary disease. Electronically Signed   By: Andrea Gasman M.D.   On: 06/28/2024 14:56   CT ANGIO CHEST AORTA W/CM & OR WO/CM Result Date: 06/15/2024 CLINICAL DATA:  Preoperative TAVR EXAM: CT ANGIOGRAPHY CHEST, ABDOMEN AND PELVIS TECHNIQUE: Multidetector CT imaging through the chest, abdomen and pelvis was performed using the standard protocol during bolus administration of intravenous contrast. Multiplanar reconstructed images and MIPs were obtained and reviewed to evaluate the vascular anatomy. RADIATION DOSE REDUCTION: This exam was performed according to the departmental dose-optimization program which includes automated exposure control, adjustment of the mA and/or kV according to patient size and/or use of iterative reconstruction technique. CONTRAST:  OMNIPAQUE  IOHEXOL  350 MG/ML SOLN COMPARISON:  08/29/2019 FINDINGS: CTA CHEST FINDINGS VASCULAR Aorta: Satisfactory opacification of the aorta. Normal contour and caliber of the thoracic aorta. No evidence of aneurysm, dissection, or other acute aortic pathology. No significant atherosclerosis. Cardiovascular: No evidence of pulmonary embolism on limited non-tailored examination. Mild cardiomegaly. Severe thickening and calcification of the aortic valve leaflets (series 307, image 329). No pericardial effusion. Review of the MIP images confirms the above findings. NON VASCULAR Mediastinum/Nodes: No enlarged mediastinal, hilar, or axillary lymph  nodes. Thyroid  gland, trachea, and esophagus demonstrate no significant findings. Lungs/Pleura: Small pulmonary nodules in the right lung, unchanged compared to prior examination dated 08/29/2019 and benign, requiring no further follow-up or characterization. No  pleural effusion or pneumothorax. Musculoskeletal: No chest wall abnormality. No acute osseous findings. Review of the MIP images confirms the above findings. CTA ABDOMEN AND PELVIS FINDINGS VASCULAR Normal contour and caliber of the abdominal aorta. No evidence of aneurysm, dissection, or other acute aortic pathology. 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 No significant atherosclerosis. Review of the MIP images confirms the above findings. NON-VASCULAR Hepatobiliary: No focal liver abnormality is seen. Status post cholecystectomy. No biliary dilatation. Pancreas: Unremarkable. No pancreatic ductal dilatation or surrounding inflammatory changes. Spleen: Normal in size without significant abnormality. Adrenals/Urinary Tract: Adrenal glands are unremarkable. Kidneys are normal, without renal calculi, solid lesion, or hydronephrosis. Bladder is unremarkable. Stomach/Bowel: Stomach is within normal limits. Appendix appears normal. No evidence of bowel wall thickening, distention, or inflammatory changes. Lymphatic: No enlarged abdominal or pelvic lymph nodes. Reproductive: No mass or other significant abnormality. Other: No abdominal wall hernia or abnormality. No ascites. Musculoskeletal: No acute osseous findings. IMPRESSION: 1. Normal contour and caliber of the thoracic and abdominal aorta. No evidence of aneurysm, dissection, or other acute aortic pathology. No significant atherosclerosis. Please see separately provided cardiology report for measurements relevant to TAVR procedure. 2. Severe thickening and calcification of the aortic valve leaflets in keeping with aortic stenosis. Please see separately provided cardiology report for dynamic assessment of leaflet function. 3. Mild cardiomegaly. 4. No acute findings in the chest, abdomen, or pelvis. Electronically Signed   By: Marolyn JONETTA Jaksch M.D.   On: 06/15/2024 06:56   CT ANGIO ABDOMEN PELVIS  W & WO  CONTRAST Result Date: 06/15/2024 CLINICAL DATA:  Preoperative TAVR EXAM: CT ANGIOGRAPHY CHEST, ABDOMEN AND PELVIS TECHNIQUE: Multidetector CT imaging through the chest, abdomen and pelvis was performed using the standard protocol during bolus administration of intravenous contrast. Multiplanar reconstructed images and MIPs were obtained and reviewed to evaluate the vascular anatomy. RADIATION DOSE REDUCTION: This exam was performed according to the departmental dose-optimization program which includes automated exposure control, adjustment of the mA and/or kV according to patient size and/or use of iterative reconstruction technique. CONTRAST:  OMNIPAQUE  IOHEXOL  350 MG/ML SOLN COMPARISON:  08/29/2019 FINDINGS: CTA CHEST FINDINGS VASCULAR Aorta: Satisfactory opacification of the aorta. Normal contour and caliber of the thoracic aorta. No evidence of aneurysm, dissection, or other acute aortic pathology. No significant atherosclerosis. Cardiovascular: No evidence of pulmonary embolism on limited non-tailored examination. Mild cardiomegaly. Severe thickening and calcification of the aortic valve leaflets (series 307, image 329). No pericardial effusion. Review of the MIP images confirms the above findings. NON VASCULAR Mediastinum/Nodes: No enlarged mediastinal, hilar, or axillary lymph nodes. Thyroid  gland, trachea, and esophagus demonstrate no significant findings. Lungs/Pleura: Small pulmonary nodules in the right lung, unchanged compared to prior examination dated 08/29/2019 and benign, requiring no further follow-up or characterization. No pleural effusion or pneumothorax. Musculoskeletal: No chest wall abnormality. No acute osseous findings. Review of the MIP images confirms the above findings. CTA ABDOMEN AND PELVIS FINDINGS VASCULAR Normal contour and caliber of the abdominal aorta. No evidence of aneurysm, dissection, or other acute aortic pathology. 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 No significant atherosclerosis. Review of the MIP images confirms the above findings. NON-VASCULAR Hepatobiliary: No focal liver abnormality is seen. Status post cholecystectomy. No  biliary dilatation. Pancreas: Unremarkable. No pancreatic ductal dilatation or surrounding inflammatory changes. Spleen: Normal in size without significant abnormality. Adrenals/Urinary Tract: Adrenal glands are unremarkable. Kidneys are normal, without renal calculi, solid lesion, or hydronephrosis. Bladder is unremarkable. Stomach/Bowel: Stomach is within normal limits. Appendix appears normal. No evidence of bowel wall thickening, distention, or inflammatory changes. Lymphatic: No enlarged abdominal or pelvic lymph nodes. Reproductive: No mass or other significant abnormality. Other: No abdominal wall hernia or abnormality. No ascites. Musculoskeletal: No acute osseous findings. IMPRESSION: 1. Normal contour and caliber of the thoracic and abdominal aorta. No evidence of aneurysm, dissection, or other acute aortic pathology. No significant atherosclerosis. Please see separately provided cardiology report for measurements relevant to TAVR procedure. 2. Severe thickening and calcification of the aortic valve leaflets in keeping with aortic stenosis. Please see separately provided cardiology report for dynamic assessment of leaflet function. 3. Mild cardiomegaly. 4. No acute findings in the chest, abdomen, or pelvis. Electronically Signed   By: Marolyn JONETTA Jaksch M.D.   On: 06/15/2024 06:56   CT CORONARY MORPH W/CTA COR W/SCORE W/CA W/CM &/OR WO/CM Addendum Date: 06/14/2024 ADDENDUM REPORT: 06/14/2024 10:51 EXAM: OVER-READ INTERPRETATION  CT CHEST The following report is an over-read performed by radiologist Dr. Elspeth Dada [facility] on 06/14/2024. This over-read does not include interpretation of cardiac or coronary anatomy or pathology. The cardiovascular interpretation  by the cardiologist is attached. COMPARISON:  Chest CTA dated 06/12/2024 and 08/29/2019. FINDINGS: Mediastinum: Unremarkable. Lungs and pleura: Stable right lower lobe nodule with a mean diameter of 7 mm on image number 32/306. Stable additional 3 mm and 1 mm adjacent nodules in a subpleural location in the right lower lobe on the same image. Stable 4 mm normal perifissural lymph node on image number 21/306. No new or enlarging nodules seen. Upper abdomen: Unremarkable. Musculoskeletal: Thoracic spine degenerative changes with changes of DISH. IMPRESSION: 1. Stable right lower lobe nodules and normal perifissural lymph node. These are unchanged since 08/29/2019 and are compatible with benign nodules, not needing imaging follow-up. 2. No acute abnormality. Electronically Signed   By: Elspeth Bathe M.D.   On: 06/14/2024 10:51   Result Date: 06/14/2024 CLINICAL DATA:  53M with severe aortic stenosis being evaluated for a TAVR procedure. EXAM: Cardiac TAVR CT TECHNIQUE: A non-contrast, gated CT scan was obtained with axial slices of 2.5 mm through the heart for aortic valve scoring. A 120 kV retrospective, gated, contrast cardiac scan was obtained. Gantry rotation speed was 230 msec and collimation was 0.63 mm. Nitroglycerin was not given. A delayed scan was obtained to exclude left atrial appendage thrombus. The 3D dataset was reconstructed in systole with motion correction. The 3D data set was reconstructed in 5% intervals of the 0-95% of the R-R cycle. Systolic and diastolic phases were analyzed on a dedicated workstation using MPR, MIP, and VRT modes. The patient received 100 cc of contrast. FINDINGS: Aortic Root: Aortic valve: Bicuspid aortic valve, Sievers type 0 Aortic valve calcium score: 3686 Aortic annulus: Diameter: 29mm x 24mm Perimeter: 82mm Area: 578mm^2 Calcifications: Mild calcification adjacent to noncoronary cusp Coronary height: Min Left - 23mm; Min Right - 22mm Sinotubular height: Left cusp - 31mm;  Right cusp - 30mm; Noncoronary cusp - 32mm LVOT (as measured 3 mm below the annulus): Diameter: 30mm x 23mm Area: 514mm^2 Calcifications: No calcifications Aortic sinus width: Max 40mm, Min 29mm Sinotubular junction width: 36mm x 74m Optimum Fluoroscopic Angle for Delivery: LAO 36 CRA 32 Cardiac: Right atrium: Normal size Right ventricle:  Normal size Pulmonary arteries: Normal size Pulmonary veins: Normal configuration Left atrium: Normal size Left ventricle: Normal size Pericardium: Normal thickness Coronary arteries: Coronary calcium score 0 IMPRESSION: 1. Bicuspid aortic valve, Sievers type 0, with severe calcifications (AV calcium score 3686) 2. Aortic annulus measures 29mm x 24mm in diameter with perimeter 82mm and area 587mm^2. Mild annular calcification adjacent to noncoronary cusp. Annular measurements suitable for delivery of 26mm Edwards Sapien 3 valve 3. Sufficient coronary to annulus distance. 4. Coronary calcium score 0 Electronically Signed: By: Lonni Nanas M.D. On: 06/13/2024 11:32   CARDIAC CATHETERIZATION Result Date: 06/06/2024 No angiographic evidence of CAD Recommendations: Continue workup for AVR.     Treatments: surgery:     07/01/2024 Patient:  Johnathan Richmond Pre-Op Dx: severe aortic stenosis Bicuspid aortic valve History of atrial flutter   Post-op Dx:  same Procedure: Aortic valve replacement with a 25mm Inspiris valve Placement of a 45mm Atriaclip       Surgeon and Role:      * Lightfoot, Linnie KIDD, MD - Primary    DEWAINE MICAEL Cera, PA-C   Discharge Exam: Blood pressure 130/81, pulse 82, temperature 97.7 F (36.5 C), temperature source Oral, resp. rate 17, height 6' 2 (1.88 m), weight 120.8 kg, SpO2 95%.  Gen: NAD Heart:RRR Lungs: CTA bilaterally Ext: no edema Incisions: C/D/I   Discharge Medications:  The patient has been discharged on:   1.Beta Blocker:  Yes [ x  ]                              No   [   ]                              If No,  reason:  2.Ace Inhibitor/ARB: Yes [   ]                                     No  [  x ]                                     If No, reason:BO well controlled, no CAD  3.Statin:   Yes [   ]                  No  [ x  ]                  If No, reason:no CAD or hypercholesterolemia  4.ArleeneBETHA [place]  [   ]                  No   [  X ]                  If No, reason: No CAD, on coumadin    Patient had ACS upon admission:n  Plavix/P2Y12 inhibitor: Yes [   ]                                      No  [  n ]     Discharge Instructions     Amb Referral to Cardiac Rehabilitation  Complete by: As directed    Diagnosis: Valve Replacement   Valve: Aortic   After initial evaluation and assessments completed: Virtual Based Care may be provided alone or in conjunction with Phase 2 Cardiac Rehab based on patient barriers.: Yes   Intensive Cardiac Rehabilitation (ICR) [facility] location only OR Traditional Cardiac Rehabilitation (TCR) *If criteria for ICR are not met will enroll in TCR ([facility] only): Yes      Allergies as of 07/06/2024   No Known Allergies      Medication List     TAKE these medications    acetaminophen  500 MG tablet Commonly known as: TYLENOL  Take 1-2 tablets (500-1,000 mg total) by mouth every 6 (six) hours as needed.   amiodarone 200 MG tablet Commonly known as: PACERONE Take 1 tablet (200 mg total) by mouth 2 (two) times daily. X 7 days, then decrease to 200 mg daily   Lantus  SoloStar 100 UNIT/ML Solostar Pen Generic drug: insulin  glargine Inject 20 Units into the skin daily.   metFORMIN  750 MG 24 hr tablet Commonly known as: GLUCOPHAGE -XR Take 1 tablet (750 mg total) by mouth daily with breakfast.   metoprolol tartrate 25 MG tablet Commonly known as: LOPRESSOR Take 0.5 tablets (12.5 mg total) by mouth 2 (two) times daily.   OVER THE COUNTER MEDICATION Take 1 tablet by mouth daily. Boost TRT Gummy   oxyCODONE  5 MG immediate release tablet Commonly known as: Oxy  IR/ROXICODONE  Take 1 tablet (5 mg total) by mouth every 4 (four) hours as needed for severe pain (pain score 7-10).   Pen Needles 31G X 5 MM Misc 1 each by Does not apply route daily. May substitute to any manufacturer covered by patient's insurance.   warfarin 3 MG tablet Commonly known as: COUMADIN  Take as directed. If you are unsure how to take this medication, talk to your nurse or doctor. Original instructions: Take 1 tablet (3 mg total) by mouth daily.         SignedBETHA Rocky Shad, PA-C  07/06/2024, 8:08 AM

## 2024-07-04 NOTE — Progress Notes (Signed)
 Physical Therapy Treatment Patient Details Name: Johnathan Richmond MRN: [id number] DOB: 1961/01/04 Today's Date: 07/04/2024   History of Present Illness 63 y.o. male admitted 10/27 for and underwent elective aortic valve replacement, and Left atrial appendage clipping.  Past medical history of DM, and paroxysmal atrial flutter.    PT Comments  Excellent progress in a short period of time since operation. Has ambulated in hallways twice today with staff. Able to participate in bed mobility, transfer, and gait training today at a CGA level without assistive device. Demonstrates minor instability, declines use of RW for support, able to self correct with good awareness. Believe he has surpassed the need for post-acute rehab and will benefit most from cardiac rehab program once cleared by surgeon. Discussed findings and recs with patient. Has good family support at home. Will continue to progress acutely. Encouraged OOB and ambulate with staff several times per day. Patient will continue to benefit from skilled physical therapy services to further improve independence with functional mobility.    If plan is discharge home, recommend the following: Assistance with cooking/housework;Assist for transportation   Can travel by private vehicle        Equipment Recommendations  None recommended by PT    Recommendations for Other Services       Precautions / Restrictions Precautions Precautions: Sternal Precaution Booklet Issued: No Recall of Precautions/Restrictions: Intact Restrictions Weight Bearing Restrictions Per Provider Order: Yes RUE Weight Bearing Per Provider Order: Non weight bearing LUE Weight Bearing Per Provider Order: Non weight bearing Other Position/Activity Restrictions: cardiac sternal precautions     Mobility  Bed Mobility Overal bed mobility: Needs Assistance Bed Mobility: Rolling, Sidelying to Sit Rolling: Contact guard assist Sidelying to sit: Contact guard assist,  HOB elevated       General bed mobility comments: CGA for safety, heart pillow for comfort and to maintain precautions. Modified roll towards Rt side to bring LEs down then using trunk muscles to pull self up. This was more comfortable to pt and felt easier.    Transfers Overall transfer level: Needs assistance Equipment used: None Transfers: Sit to/from Stand Sit to Stand: Contact guard assist           General transfer comment: CGA for safety, declines RW need. No overt LOB noted, good control descending into chair, cues for hand placement to maintain sternal precautions.    Ambulation/Gait Ambulation/Gait assistance: Contact guard assist Gait Distance (Feet): 125 Feet Assistive device: None Gait Pattern/deviations: Step-through pattern, Decreased stride length Gait velocity: decreased Gait velocity interpretation: <1.8 ft/sec, indicate of risk for recurrent falls   General Gait Details: Mild instability but able to self correct without physical interventiion. No overt buckling or LOB noted. Pt fatigued from recently walking with staff earlier. Minimal dyspnea. Educated on findindg, symptom awareness and modified RPE scale, pacing, energy conservation, and safety. Denies RW use.   Stairs             Wheelchair Mobility     Tilt Bed    Modified Rankin (Stroke Patients Only)       Balance Overall balance assessment: Needs assistance Sitting-balance support: No upper extremity supported, Feet supported Sitting balance-Leahy Scale: Fair     Standing balance support: No upper extremity supported Standing balance-Leahy Scale: Fair Standing balance comment: CGA without UE support                            Communication Communication Communication: No apparent  difficulties  Cognition Arousal: Alert Behavior During Therapy: WFL for tasks assessed/performed   PT - Cognitive impairments: No apparent impairments                          Following commands: Intact      Cueing Cueing Techniques: Verbal cues, Gestural cues  Exercises General Exercises - Lower Extremity Ankle Circles/Pumps: AROM, Both, 10 reps, Supine Quad Sets: Strengthening, Both, 10 reps, Seated Gluteal Sets: Strengthening, Both, 10 reps, Seated    General Comments General comments (skin integrity, edema, etc.): VSS      Pertinent Vitals/Pain Pain Assessment Pain Assessment: Faces Faces Pain Scale: Hurts a little bit Pain Location: sternum Pain Descriptors / Indicators: Aching Pain Intervention(s): Limited activity within patient's tolerance, Monitored during session, Repositioned    Home Living                          Prior Function            PT Goals (current goals can now be found in the care plan section) Acute Rehab PT Goals Patient Stated Goal: Get stronger PT Goal Formulation: With patient Time For Goal Achievement: 07/16/24 Potential to Achieve Goals: Good Progress towards PT goals: Progressing toward goals    Frequency    Min 3X/week      PT Plan      Co-evaluation              AM-PAC PT 6 Clicks Mobility   Outcome Measure  Help needed turning from your back to your side while in a flat bed without using bedrails?: A Little Help needed moving from lying on your back to sitting on the side of a flat bed without using bedrails?: A Little Help needed moving to and from a bed to a chair (including a wheelchair)?: A Little Help needed standing up from a chair using your arms (e.g., wheelchair or bedside chair)?: A Little Help needed to walk in hospital room?: A Little Help needed climbing 3-5 steps with a railing? : A Little 6 Click Score: 18    End of Session Equipment Utilized During Treatment: Gait belt Activity Tolerance: Patient tolerated treatment well Patient left: with call bell/phone within reach;in chair;with chair alarm set   PT Visit Diagnosis: Unsteadiness on feet (R26.81);Other  abnormalities of gait and mobility (R26.89);Muscle weakness (generalized) (M62.81);Difficulty in walking, not elsewhere classified (R26.2);Pain Pain - part of body:  (sternum)     Time: 8545-8479 PT Time Calculation (min) (ACUTE ONLY): 26 min  Charges:    $Gait Training: 8-22 mins $Therapeutic Activity: 8-22 mins PT General Charges $$ ACUTE PT VISIT: 1 Visit                     Leontine Roads, PT, DPT [facility]  Rehabilitation Services Physical Therapist Office: [phone] Website: [email or web address]    Leontine GORMAN Roads 07/04/2024, 4:28 PM

## 2024-07-04 NOTE — Progress Notes (Signed)
 NAME:  Johnathan Richmond, MRN:  [id number], DOB:  [birth year], LOS: 3 ADMISSION DATE:  07/01/2024, CONSULTATION DATE:  10/27 REFERRING MD:  Shyrl, CHIEF COMPLAINT:  post-cardiac surgery ICU care   History of Present Illness:  63 year old male w/ h/o bicuspid aortic valve and severe AS. Initially dx'd as moderate in 2020. Seen in 2025 after being lost to follow-up w/ new onset exertional dyspnea and chest pain in Aug. At this time ECHO showed EF 65-70%, mild MR, prob bicuspid aorta, and severe AS w/ mean gradient 39.5 mmHg. Left heart cath completed 10/2 nml CAs. Presented to [facility] 10/27 for elective AVR w/ left atrial appendage clipping   OR Course  Bypass time: 9:18-11:21 (2hrs12min) Clamp time: [phone] (1hr26 min) Cell saver: 740 EBL 1100 CT output on arrival to ICU: 0   Pertinent  Medical History  AS, PAF, DM, HTN, hypothyroidism, MO Never smoked Significant Hospital Events: Including procedures, antibiotic start and stop dates in addition to other pertinent events   10/27 to OR for AVR 10/27 - Extubated 10/29 Afib/flutter, started amio    Interim History / Subjective:  Sinus overnight BMP CBC this morning are grossly unremarkable  50cc from chest tubes overnight   Objective    Blood pressure (!) 145/78, pulse 83, temperature 98.2 F (36.8 C), temperature source Axillary, resp. rate 14, height 6' 2 (1.88 m), weight 123.4 kg, SpO2 99%.        Intake/Output Summary (Last 24 hours) at 07/04/2024 1153 Last data filed at 07/04/2024 0900 Gross per 24 hour  Intake 519.41 ml  Output 2320 ml  Net -1800.59 ml   Filed Weights   07/02/24 0500 07/03/24 0500 07/04/24 0500  Weight: 125.6 kg 125 kg 123.4 kg    Examination:  General: wdwn middle aged M NAD  Neuro: AAO x4  HENT:NCAT R internal jugular central access  Lungs: clear bilat Cardiovascular:  rrr s1s2. Chest tubes w scant output  Abdomen: soft round normoactive  Extremities: no obvious acute joint  deformtiy Skin: c/d/w   Resolved problem list  Endotracheally intubated  hyponatremia  Assessment and Plan    Severe AS s/p AVR Afib/flutter -- s/p LAA clipping, with some recurrence  Expected post op ABLA/thrombocytopenia -stable/improving. Without clinical significance at this time  IDDM P -L/T/D/W per CVTS -- plan to dc wires, dc chest tube  -cont amio -- will d/w CVTS re timing of converting to PO  -cont mobility, pulm hygiene efforts -multimodal analgesia -cont metop, asa -cont lasix  -plan to transfer out of ICU today   Labs   CBC: Recent Labs  Lab 07/01/24 1855 07/02/24 0500 07/02/24 1644 07/03/24 0411 07/04/24 0403  WBC 11.5* 12.5* 14.6* 11.9* 9.9  HGB 12.6* 11.6* 12.8* 11.5* 11.1*  HCT 37.1* 33.6* 38.4* 34.1* 33.1*  MCV 81.5 82.4 83.1 82.6 82.1  PLT 139* 139* 134* 122* 140*    Basic Metabolic Panel: Recent Labs  Lab 07/01/24 1855 07/02/24 0500 07/02/24 1644 07/03/24 0411 07/04/24 0403  NA 138 135 134* 133* 136  K 4.3 3.9 4.4 4.1 3.9  CL 109 108 101 104 101  CO2 20* 21* 22 21* 24  GLUCOSE 130* 144* 165* 146* 133*  BUN 11 10 12 10 9   CREATININE 1.04 1.05 1.07 0.86 0.80  CALCIUM 7.9* 7.5* 8.1* 7.8* 8.0*  MG 3.0* 2.1 2.3 1.9 2.2   GFR: Estimated Creatinine Clearance: 130.2 mL/min (by C-G formula based on SCr of 0.8 mg/dL). Recent Labs  Lab 07/01/24 1338 07/01/24  1535 07/01/24 1855 07/02/24 0500 07/02/24 1644 07/03/24 0411 07/04/24 0403  WBC  --   --    < > 12.5* 14.6* 11.9* 9.9  LATICACIDVEN 1.3 0.9  --   --   --   --   --    < > = values in this interval not displayed.    Liver Function Tests: Recent Labs  Lab 06/28/24 1047  AST 17  ALT 19  ALKPHOS 62  BILITOT 0.9  PROT 7.3  ALBUMIN 3.7   No results for input(s): LIPASE, AMYLASE in the last 168 hours. No results for input(s): AMMONIA in the last 168 hours.  ABG    Component Value Date/Time   PHART 7.353 07/01/2024 1249   PCO2ART 38.9 07/01/2024 1249   PO2ART 138  (H) 07/01/2024 1249   HCO3 21.5 07/01/2024 1249   TCO2 23 07/01/2024 1249   ACIDBASEDEF 4.0 (H) 07/01/2024 1249   O2SAT 99 07/01/2024 1249     Coagulation Profile: Recent Labs  Lab 06/28/24 1047 07/01/24 1318 07/03/24 1325  INR 0.9 1.4* 1.3*    Cardiac Enzymes: No results for input(s): CKTOTAL, CKMB, CKMBINDEX, TROPONINI in the last 168 hours.  HbA1C: Hgb A1c MFr Bld  Date/Time Value Ref Range Status  06/28/2024 10:47 AM 6.5 (H) 4.8 - 5.6 % Final    Comment:    (NOTE) Diagnosis of Diabetes The following HbA1c ranges recommended by the American Diabetes Association (ADA) may be used as an aid in the diagnosis of diabetes mellitus.  Hemoglobin             Suggested A1C NGSP%              Diagnosis  <5.7                   Non Diabetic  5.7-6.4                Pre-Diabetic  >6.4                   Diabetic  <7.0                   Glycemic control for                       adults with diabetes.    04/02/2024 09:24 AM 7.4 (H) 4.8 - 5.6 % Final    Comment:             Prediabetes: 5.7 - 6.4          Diabetes: >6.4          Glycemic control for adults with diabetes: <7.0     CBG: Recent Labs  Lab 07/03/24 1226 07/03/24 1627 07/03/24 2114 07/04/24 0607 07/04/24 1117  GLUCAP 182* 128* 128* 130* 124*    Mod MDM  Ronnald Gave MSN, AGACNP-BC [place] [facility] Amion for pager  07/04/2024, 11:53 AM

## 2024-07-04 NOTE — Progress Notes (Signed)
      [address]       [place] [address]             [phone]                 3 Days Post-Op Procedure(s) (LRB): REPLACEMENT, AORTIC VALVE, OPEN WITH INSPIRIS VALVE SIZE (N/A) ECHOCARDIOGRAM, TRANSESOPHAGEAL (N/A) CLIPPING, LEFT ATRIAL APPENDAGE USING  ATRICURE CLIP SIZE 45 (N/A)   Events: Stable overnight _______________________________________________________________ Vitals: BP 139/79   Pulse 84   Temp 98.3 F (36.8 C) (Oral)   Resp 19   Ht 6' 2 (1.88 m)   Wt 123.4 kg   SpO2 99%   BMI 34.93 kg/m  Filed Weights   07/02/24 0500 07/03/24 0500 07/04/24 0500  Weight: 125.6 kg 125 kg 123.4 kg     - Neuro: alert NAD  - Cardiovascular: sinus  Drips: none.      - Pulm: EWOB    ABG    Component Value Date/Time   PHART 7.353 07/01/2024 1249   PCO2ART 38.9 07/01/2024 1249   PO2ART 138 (H) 07/01/2024 1249   HCO3 21.5 07/01/2024 1249   TCO2 23 07/01/2024 1249   ACIDBASEDEF 4.0 (H) 07/01/2024 1249   O2SAT 99 07/01/2024 1249    - Abd: Nd - Extremity: trace edema  .Intake/Output      10/29 0701 10/30 0700 10/30 0701 10/31 0700   I.V. (mL/kg) 595.7 (4.8)    IV Piggyback 220.1    Total Intake(mL/kg) 815.8 (6.6)    Urine (mL/kg/hr) 2250 (0.8)    Stool 0    Chest Tube 120    Total Output 2370    Net -1554.2         Urine Occurrence 2 x    Stool Occurrence 5 x       _______________________________________________________________ Labs:    Latest Ref Rng & Units 07/04/2024    4:03 AM 07/03/2024    4:11 AM 07/02/2024    4:44 PM  CBC  WBC 4.0 - 10.5 K/uL 9.9  11.9  14.6   Hemoglobin 13.0 - 17.0 g/dL 88.8  88.4  87.1   Hematocrit 39.0 - 52.0 % 33.1  34.1  38.4   Platelets 150 - 400 K/uL 140  122  134       Latest Ref Rng & Units 07/04/2024    4:03 AM 07/03/2024    4:11 AM 07/02/2024    4:44 PM  CMP  Glucose 70 - 99 mg/dL 866  853  834   BUN 8 - 23 mg/dL 9  10  12    Creatinine 0.61 - 1.24 mg/dL 9.19  9.13  8.92    Sodium 135 - 145 mmol/L 136  133  134   Potassium 3.5 - 5.1 mmol/L 3.9  4.1  4.4   Chloride 98 - 111 mmol/L 101  104  101   CO2 22 - 32 mmol/L 24  21  22    Calcium 8.9 - 10.3 mg/dL 8.0  7.8  8.1     CXR: PV congestion  _______________________________________________________________  Assessment and Plan: POD 3 s/p AVR, atriclip  Neuro: pain controlled CV: PO amio today.  Will remove wires Pulm: IS, ambulation Renal: creat stable.  Will diurese GI: on diet Heme: stable ID: [email or web address] Endo: SSI Dispo: floor   Johnathan Richmond O Johnathan Richmond 07/04/2024 7:23 AM

## 2024-07-04 NOTE — Progress Notes (Signed)
 Pt declined walking, no reason given. Will continue to follow.

## 2024-07-05 DIAGNOSIS — I4891 Unspecified atrial fibrillation: Secondary | ICD-10-CM

## 2024-07-05 LAB — GLUCOSE, CAPILLARY
Glucose-Capillary: 129 mg/dL — ABNORMAL HIGH (ref 70–99)
Glucose-Capillary: 95 mg/dL (ref 70–99)
Glucose-Capillary: 121 mg/dL — ABNORMAL HIGH (ref 70–99)
Glucose-Capillary: 219 mg/dL — ABNORMAL HIGH (ref 70–99)

## 2024-07-05 LAB — CBC
HCT: 33.1 % — ABNORMAL LOW (ref 39.0–52.0)
Hemoglobin: 11.2 g/dL — ABNORMAL LOW (ref 13.0–17.0)
MCH: 27.7 pg (ref 26.0–34.0)
MCHC: 33.8 g/dL (ref 30.0–36.0)
MCV: 81.9 fL (ref 80.0–100.0)
Platelets: 180 K/uL (ref 150–400)
RBC: 4.04 MIL/uL — ABNORMAL LOW (ref 4.22–5.81)
RDW: 13.7 % (ref 11.5–15.5)
WBC: 8.6 K/uL (ref 4.0–10.5)
nRBC: 0 % (ref 0.0–0.2)

## 2024-07-05 LAB — BASIC METABOLIC PANEL WITH GFR
Anion gap: 13 (ref 5–15)
BUN: 12 mg/dL (ref 8–23)
CO2: 23 mmol/L (ref 22–32)
Calcium: 8.3 mg/dL — ABNORMAL LOW (ref 8.9–10.3)
Chloride: 100 mmol/L (ref 98–111)
Creatinine, Ser: 0.92 mg/dL (ref 0.61–1.24)
GFR, Estimated: >60 mL/min (ref >60)
Glucose, Bld: 131 mg/dL — ABNORMAL HIGH (ref 70–99)
Potassium: 3.9 mmol/L (ref 3.5–5.1)
Sodium: 136 mmol/L (ref 135–145)

## 2024-07-05 LAB — MAGNESIUM: Magnesium: 1.9 mg/dL (ref 1.7–2.4)

## 2024-07-05 MED ORDER — WARFARIN - PHYSICIAN DOSING INPATIENT: Freq: Every day | Status: DC

## 2024-07-05 MED ORDER — MAGNESIUM OXIDE -MG SUPPLEMENT 400 (240 MG) MG PO TABS
400.0000 mg | ORAL_TABLET | Freq: Every day | ORAL | Status: DC
  Administered 2024-07-05 – 2024-07-06 (×2): 400 mg via ORAL
  Filled 2024-07-05 (×2): qty 1

## 2024-07-05 MED ORDER — WARFARIN SODIUM 2 MG PO TABS
3.0000 mg | ORAL_TABLET | Freq: Every day | ORAL | Status: DC
  Administered 2024-07-05 – 2024-07-06 (×2): 3 mg via ORAL
  Filled 2024-07-05 (×2): qty 1

## 2024-07-05 MED FILL — Sodium Bicarbonate IV Soln 8.4%: INTRAVENOUS | Qty: 50 | Status: AC

## 2024-07-05 MED FILL — Calcium Chloride Inj 10%: INTRAVENOUS | Qty: 10 | Status: AC

## 2024-07-05 MED FILL — Lidocaine HCl Local Preservative Free (PF) Inj 2%: INTRAMUSCULAR | Qty: 14 | Status: AC

## 2024-07-05 MED FILL — Mannitol IV Soln 20%: INTRAVENOUS | Qty: 500 | Status: AC

## 2024-07-05 MED FILL — Heparin Sodium (Porcine) Inj 1000 Unit/ML: INTRAMUSCULAR | Qty: 20 | Status: AC

## 2024-07-05 MED FILL — Sodium Chloride IV Soln 0.9%: INTRAVENOUS | Qty: 2000 | Status: AC

## 2024-07-05 MED FILL — Electrolyte-R (PH 7.4) Solution: INTRAVENOUS | Qty: 3000 | Status: AC

## 2024-07-05 NOTE — Evaluation (Signed)
 Occupational Therapy Evaluation Patient Details Name: Johnathan Richmond MRN: [id number] DOB: 10/20/1960 Today's Date: 07/05/2024   History of Present Illness   63 y.o. male admitted 10/27 for and underwent elective aortic valve replacement, and Left atrial appendage clipping.  Past medical history of DM, and paroxysmal atrial flutter.     Clinical Impressions Pt admitted based on above, and was seen based on problem list below. PTA pt was independent with ADLs and IADLs. Today pt is requiring set up  to min  assist for ADLs. Assist largely d/t cueing for compensatory techniques. Bed mobility and functional transfers are  s for safety without an AD. Verbally educated pt on compensatory strategies for ADLs and energy conservation strategies. Recommending use of shower seat at d/c to promote energy conservation, and encouraged continue follow up with cardiac rehab. OT will continue to follow acutely to maximize functional independence.     If plan is discharge home, recommend the following:   A little help with walking and/or transfers;A little help with bathing/dressing/bathroom;Assistance with cooking/housework     Functional Status Assessment   Patient has had a recent decline in their functional status and demonstrates the ability to make significant improvements in function in a reasonable and predictable amount of time.     Equipment Recommendations   Tub/shower seat      Precautions/Restrictions   Precautions Precautions: Sternal Precaution Booklet Issued: No Recall of Precautions/Restrictions: Intact Restrictions Weight Bearing Restrictions Per Provider Order: No Other Position/Activity Restrictions: cardiac sternal precautions     Mobility Bed Mobility Overal bed mobility: Modified Independent     General bed mobility comments: Good recall of log roll technique to return to supine    Transfers Overall transfer level: Needs assistance Equipment used:  None Transfers: Sit to/from Stand Sit to Stand: Supervision           General transfer comment: s for safety, cueing for use of momentum to come to stand      Balance Overall balance assessment: Needs assistance Sitting-balance support: No upper extremity supported, Feet supported Sitting balance-Leahy Scale: Fair     Standing balance support: No upper extremity supported Standing balance-Leahy Scale: Fair       ADL either performed or assessed with clinical judgement   ADL Overall ADL's : Needs assistance/impaired Eating/Feeding: Set up;Sitting   Grooming: Set up;Sitting           Upper Body Dressing : Sitting;Minimal assistance   Lower Body Dressing: Minimal assistance;Sit to/from Database Administrator: Supervision/safety;Regular Social Worker and Hygiene: Supervision/safety;Sit to/from stand   Tub/ Shower Transfer: Contact guard assist   Functional mobility during ADLs: Contact guard assist;Supervision/safety General ADL Comments: Pt limited by decreased activity tolerance     Vision Baseline Vision/History: 0 No visual deficits Patient Visual Report: No change from baseline Vision Assessment?: No apparent visual deficits            Pertinent Vitals/Pain Pain Assessment Pain Assessment: Faces Faces Pain Scale: Hurts a little bit Pain Location: sternum Pain Descriptors / Indicators: Aching Pain Intervention(s): Monitored during session     Extremity/Trunk Assessment Upper Extremity Assessment Upper Extremity Assessment: Generalized weakness   Lower Extremity Assessment Lower Extremity Assessment: Defer to PT evaluation       Communication Communication Communication: No apparent difficulties   Cognition Arousal: Alert Behavior During Therapy: WFL for tasks assessed/performed Cognition: No apparent impairments     Following commands: Intact       Cueing  General Comments   Cueing Techniques: Verbal  cues;Gestural cues  VSS on RA           Home Living Family/patient expects to be discharged to:: Private residence Living Arrangements: Other relatives Available Help at Discharge: Family;Available 24 hours/day Type of Home: Apartment Home Access: Level entry     Home Layout: One level     Bathroom Shower/Tub: Chief Strategy Officer: Standard Bathroom Accessibility: No   Home Equipment: None          Prior Functioning/Environment Prior Level of Function : Independent/Modified Independent;Driving       Mobility Comments: ind ADLs Comments: ind    OT Problem List: Decreased strength;Decreased range of motion;Impaired balance (sitting and/or standing);Decreased activity tolerance;Decreased safety awareness;Decreased knowledge of use of DME or AE;Cardiopulmonary status limiting activity;Decreased knowledge of precautions   OT Treatment/Interventions: Self-care/ADL training;Therapeutic exercise;Energy conservation;DME and/or AE instruction;Therapeutic activities;Visual/perceptual remediation/compensation;Patient/family education;Balance training      OT Goals(Current goals can be found in the care plan section)   Acute Rehab OT Goals Patient Stated Goal: to get better OT Goal Formulation: With patient Time For Goal Achievement: 07/19/24 Potential to Achieve Goals: Good   OT Frequency:  Min 2X/week       AM-PAC OT 6 Clicks Daily Activity     Outcome Measure Help from another person eating meals?: None Help from another person taking care of personal grooming?: A Little Help from another person toileting, which includes using toliet, bedpan, or urinal?: A Little Help from another person bathing (including washing, rinsing, drying)?: A Little Help from another person to put on and taking off regular upper body clothing?: A Little Help from another person to put on and taking off regular lower body clothing?: A Little 6 Click Score: 19   End of  Session Equipment Utilized During Treatment: Gait belt Nurse Communication: Mobility status  Activity Tolerance: Patient tolerated treatment well Patient left: in bed;with call bell/phone within reach  OT Visit Diagnosis: Unsteadiness on feet (R26.81);Other abnormalities of gait and mobility (R26.89);Muscle weakness (generalized) (M62.81)                Time: 9058-8986 OT Time Calculation (min): 32 min Charges:  OT General Charges $OT Visit: 1 Visit OT Evaluation $OT Eval Moderate Complexity: 1 Mod OT Treatments $Self Care/Home Management : 8-22 mins  Adrianne BROCKS, OT  Acute Rehabilitation Services Office [phone] Secure chat preferred   Adrianne GORMAN Savers 07/05/2024, 11:11 AM

## 2024-07-05 NOTE — Progress Notes (Signed)
 Bipap ordered for PRN. Patient states he doesn't need it at this time. NAD noted. Will cont to monitor and assess need for Bipap

## 2024-07-05 NOTE — Progress Notes (Signed)
 CARDIAC REHAB PHASE I   PRE:  Rate/Rhythm: 80 SR    BP: sitting 123/80    SpO2: 100 RA  MODE:  Ambulation: 470 ft   POST:  Rate/Rhythm: 96 SR    BP: sitting 135/81     SpO2: 100 RA   Pt needed mod assist and verbal cues to roll out of bed. Stands independently and walks with standby assist. Tolerated walk well. Return to bed and able to get in bed without assist. Encouraged x2 more walks and IS.  Discussed with pt IS, sternal precautions, exercise, and CRPII. Gave diet to review. Pt receptive. Will refer to [facility] CRPII.  [id number]  Aliene Aris BS, ACSM-CEP 07/05/2024 11:54 AM

## 2024-07-05 NOTE — Progress Notes (Addendum)
 4 Days Post-Op Procedure(s) (LRB): REPLACEMENT, AORTIC VALVE, OPEN WITH INSPIRIS VALVE SIZE (N/A) ECHOCARDIOGRAM, TRANSESOPHAGEAL (N/A) CLIPPING, LEFT ATRIAL APPENDAGE USING  ATRICURE CLIP SIZE 45 (N/A) Subjective: Feels generally well  Objective: Vital signs in last 24 hours: Temp:  [97.6 F (36.4 C)-98.2 F (36.8 C)] 97.8 F (36.6 C) (10/31 0355) Pulse Rate:  [73-91] 91 (10/31 9378) Cardiac Rhythm: Normal sinus rhythm;Bundle branch block (10/30 1900) Resp:  [9-21] 18 (10/31 0621) BP: (117-147)/(67-99) 128/85 (10/31 0355) SpO2:  [94 %-100 %] 96 % (10/31 0621) Weight:  [121.4 kg] 121.4 kg (10/31 0621)  Hemodynamic parameters for last 24 hours:    Intake/Output from previous day: 10/30 0701 - 10/31 0700 In: 153.2 [P.O.:120; I.V.:33.2] Out: 400 [Urine:400] Intake/Output this shift: No intake/output data recorded.  General appearance: alert, cooperative, and no distress Heart: regular rate and rhythm Lungs: min dim left base Abdomen: benign Extremities: minor ankle edema Wound: incis healing well  Lab Results: Recent Labs    07/04/24 0403 07/05/24 0329  WBC 9.9 8.6  HGB 11.1* 11.2*  HCT 33.1* 33.1*  PLT 140* 180   BMET:  Recent Labs    07/04/24 0403 07/05/24 0329  NA 136 136  K 3.9 3.9  CL 101 100  CO2 24 23  GLUCOSE 133* 131*  BUN 9 12  CREATININE 0.80 0.92  CALCIUM 8.0* 8.3*    PT/INR:  Recent Labs    07/03/24 1325  LABPROT 16.4*  INR 1.3*   ABG    Component Value Date/Time   PHART 7.353 07/01/2024 1249   HCO3 21.5 07/01/2024 1249   TCO2 23 07/01/2024 1249   ACIDBASEDEF 4.0 (H) 07/01/2024 1249   O2SAT 99 07/01/2024 1249   CBG (last 3)  Recent Labs    07/04/24 1718 07/04/24 2106 07/05/24 0618  GLUCAP 109* 150* 129*    Meds Scheduled Meds:  acetaminophen   1,000 mg Oral Q6H   Or   acetaminophen  (TYLENOL ) oral liquid 160 mg/5 mL  1,000 mg Per Tube Q6H   amiodarone  200 mg Oral BID   aspirin  EC  325 mg Oral Daily   Or    aspirin   324 mg Per Tube Daily   bisacodyl  10 mg Oral Daily   Or   bisacodyl  10 mg Rectal Daily   Chlorhexidine Gluconate Cloth  6 each Topical Daily   docusate sodium  200 mg Oral Daily   enoxaparin  (LOVENOX ) injection  40 mg Subcutaneous QHS   furosemide  40 mg Oral Daily   insulin  aspart  0-24 Units Subcutaneous TID WC   insulin  glargine-yfgn  14 Units Subcutaneous Q24H   metoprolol tartrate  12.5 mg Oral BID   Or   metoprolol tartrate  12.5 mg Per Tube BID   pantoprazole   40 mg Oral Daily   potassium chloride  40 mEq Oral Daily   Continuous Infusions:  albumin human Stopped (07/01/24 1401)   PRN Meds:.albumin human, dextrose , metoprolol tartrate, ondansetron  (ZOFRAN ) IV, mouth rinse, oxyCODONE , traMADol  Xrays No results found.  Assessment/Plan: S/P Procedure(s) (LRB): REPLACEMENT, AORTIC VALVE, OPEN WITH INSPIRIS VALVE SIZE (N/A) ECHOCARDIOGRAM, TRANSESOPHAGEAL (N/A) CLIPPING, LEFT ATRIAL APPENDAGE USING  ATRICURE CLIP SIZE 45 (N/A) POD#4  1 afeb, s BP 110's-140's sinus, did have some afib w CVR earlier this am- cont amio po 2 sats good on RA 3 voiding well- not measured- will cont diuresis for now w/ minor edema 4 + BM 5 weight approx equal to preop 6 BS well controlled- home  meds at D/C 7 normal renal fxn 8 MG++ 1.9- will replace 9 expected ABLA - stable 10 cont pulm hygiene and rehab modalities, likely home in am if no new issues   LOS: 4 days    Lemond FORBES Cera PA-C Pager [phone] 07/05/2024  Agree Doing well  Restarting coumadin  Dispo planning  Dajsha Massaro O Laura-Lee Villegas

## 2024-07-05 NOTE — Progress Notes (Signed)
 Physical Therapy Treatment Patient Details Name: Johnathan Richmond MRN: [id number] DOB: 1961/04/04 Today's Date: 07/05/2024   History of Present Illness 63 y.o. male admitted 10/27 for and underwent elective aortic valve replacement, and Left atrial appendage clipping.  Past medical history of DM, and paroxysmal atrial flutter.    PT Comments  Continues to progress very nicely towards acute functional goals. Reviewed transfer techniques, gait with dynamic challenges (DGI 22/24 indicating low fall risk,) and bed mobility training (supervision for safety and precautions.) With use of heart pillow for moving in/out of bed pt needed minor cues initially but progressed to no assist at a supervision level today. VSS throughout. Patient will continue to benefit from skilled physical therapy services to further improve independence with functional mobility.     If plan is discharge home, recommend the following: Assistance with cooking/housework;Assist for transportation   Can travel by private vehicle        Equipment Recommendations  None recommended by PT    Recommendations for Other Services       Precautions / Restrictions Precautions Precautions: Sternal Recall of Precautions/Restrictions: Intact Restrictions Weight Bearing Restrictions Per Provider Order: No RUE Weight Bearing Per Provider Order: Non weight bearing LUE Weight Bearing Per Provider Order: Non weight bearing Other Position/Activity Restrictions: cardiac sternal precautions     Mobility  Bed Mobility Overal bed mobility: Needs Assistance Bed Mobility: Rolling, Sidelying to Sit, Sit to Sidelying Rolling: Supervision Sidelying to sit: Supervision   Sit to supine: Supervision   General bed mobility comments: Reviewed techniques with HOB flat. Pt impulsively reaches for rail but after reminding of precautions able to recall and perform transitions a couple of times with minor effort and no physical assistance.  Provided heart pillow to ensure comfort and precautions. Feels comfortable with this task today.    Transfers Overall transfer level: Needs assistance Equipment used: None Transfers: Sit to/from Stand Sit to Stand: Supervision           General transfer comment: Supervision for safety, no assist or AD utilized, able to self steady.    Ambulation/Gait Ambulation/Gait assistance: Contact guard assist Gait Distance (Feet): 150 Feet Assistive device: None Gait Pattern/deviations: Step-through pattern, Decreased stride length Gait velocity: decreased Gait velocity interpretation: <1.8 ft/sec, indicate of risk for recurrent falls   General Gait Details: Minimal instability while ambulating throughout room and hallways without assistive device. Tolerated dynamic challenges well without overt LOB. Educated on signs, symptoms, and level of exertional awareness. Feels confident with abilties. VSS.   Stairs             Wheelchair Mobility     Tilt Bed    Modified Rankin (Stroke Patients Only)       Balance Overall balance assessment: Needs assistance Sitting-balance support: No upper extremity supported, Feet supported Sitting balance-Leahy Scale: Good     Standing balance support: No upper extremity supported Standing balance-Leahy Scale: Fair Standing balance comment: supervision                 Standardized Balance Assessment Standardized Balance Assessment : Dynamic Gait Index   Dynamic Gait Index Level Surface: Normal Change in Gait Speed: Normal Gait with Horizontal Head Turns: Normal Gait with Vertical Head Turns: Normal Gait and Pivot Turn: Normal Step Over Obstacle: Mild Impairment Step Around Obstacles: Normal Steps: Mild Impairment Total Score: 22      Communication Communication Communication: No apparent difficulties  Cognition Arousal: Alert Behavior During Therapy: WFL for tasks assessed/performed   PT -  Cognitive impairments: No  apparent impairments                         Following commands: Intact      Cueing Cueing Techniques: Verbal cues  Exercises General Exercises - Lower Extremity Ankle Circles/Pumps: AROM, Both, 10 reps, Supine Quad Sets: Strengthening, Both, 10 reps, Seated Gluteal Sets: Strengthening, Both, 10 reps, Seated    General Comments General comments (skin integrity, edema, etc.): VSS on RA      Pertinent Vitals/Pain Pain Assessment Pain Assessment: No/denies pain    Home Living Family/patient expects to be discharged to:: Private residence Living Arrangements: Other relatives Available Help at Discharge: Family;Available 24 hours/day Type of Home: Apartment Home Access: Level entry       Home Layout: One level Home Equipment: None      Prior Function            PT Goals (current goals can now be found in the care plan section) Acute Rehab PT Goals Patient Stated Goal: Get stronger PT Goal Formulation: With patient Time For Goal Achievement: 07/16/24 Potential to Achieve Goals: Good Progress towards PT goals: Progressing toward goals    Frequency    Min 3X/week      PT Plan      Co-evaluation              AM-PAC PT 6 Clicks Mobility   Outcome Measure  Help needed turning from your back to your side while in a flat bed without using bedrails?: A Little Help needed moving from lying on your back to sitting on the side of a flat bed without using bedrails?: A Little Help needed moving to and from a bed to a chair (including a wheelchair)?: A Little Help needed standing up from a chair using your arms (e.g., wheelchair or bedside chair)?: A Little Help needed to walk in hospital room?: A Little Help needed climbing 3-5 steps with a railing? : A Little 6 Click Score: 18    End of Session   Activity Tolerance: Patient tolerated treatment well Patient left: with call bell/phone within reach;in bed;with bed alarm set   PT Visit Diagnosis:  Unsteadiness on feet (R26.81);Other abnormalities of gait and mobility (R26.89);Muscle weakness (generalized) (M62.81);Difficulty in walking, not elsewhere classified (R26.2);Pain     Time: 8588-8577 PT Time Calculation (min) (ACUTE ONLY): 11 min  Charges:    $Gait Training: 8-22 mins PT General Charges $$ ACUTE PT VISIT: 1 Visit                     Leontine Roads, PT, DPT [facility]  Rehabilitation Services Physical Therapist Office: [phone] Website: [email or web address]    Leontine GORMAN Roads 07/05/2024, 2:58 PM

## 2024-07-06 ENCOUNTER — Other Ambulatory Visit (HOSPITAL_COMMUNITY): Payer: Self-pay

## 2024-07-06 LAB — CBC
HCT: 32 % — ABNORMAL LOW (ref 39.0–52.0)
Hemoglobin: 10.9 g/dL — ABNORMAL LOW (ref 13.0–17.0)
MCH: 28.1 pg (ref 26.0–34.0)
MCHC: 34.1 g/dL (ref 30.0–36.0)
MCV: 82.5 fL (ref 80.0–100.0)
Platelets: 206 K/uL (ref 150–400)
RBC: 3.88 MIL/uL — ABNORMAL LOW (ref 4.22–5.81)
RDW: 13.6 % (ref 11.5–15.5)
WBC: 7.5 K/uL (ref 4.0–10.5)
nRBC: 0 % (ref 0.0–0.2)

## 2024-07-06 LAB — BASIC METABOLIC PANEL WITH GFR
Anion gap: 10 (ref 5–15)
BUN: 14 mg/dL (ref 8–23)
CO2: 25 mmol/L (ref 22–32)
Calcium: 8.1 mg/dL — ABNORMAL LOW (ref 8.9–10.3)
Chloride: 100 mmol/L (ref 98–111)
Creatinine, Ser: 0.92 mg/dL (ref 0.61–1.24)
GFR, Estimated: >60 mL/min (ref >60)
Glucose, Bld: 121 mg/dL — ABNORMAL HIGH (ref 70–99)
Potassium: 3.6 mmol/L (ref 3.5–5.1)
Sodium: 135 mmol/L (ref 135–145)

## 2024-07-06 LAB — PROTIME-INR
INR: 1 (ref 0.8–1.2)
Prothrombin Time: 14.2 s (ref 11.4–15.2)

## 2024-07-06 LAB — GLUCOSE, CAPILLARY: Glucose-Capillary: 122 mg/dL — ABNORMAL HIGH (ref 70–99)

## 2024-07-06 LAB — MAGNESIUM: Magnesium: 2 mg/dL (ref 1.7–2.4)

## 2024-07-06 MED ORDER — OXYCODONE HCL 5 MG PO TABS
5.0000 mg | ORAL_TABLET | ORAL | 0 refills | Status: DC | PRN
  Filled 2024-07-06: qty 28, 5d supply, fill #0

## 2024-07-06 MED ORDER — LANTUS SOLOSTAR 100 UNIT/ML ~~LOC~~ SOPN
20.0000 [IU] | PEN_INJECTOR | Freq: Every day | SUBCUTANEOUS | 0 refills | Status: DC
  Filled 2024-07-06: qty 15, 75d supply, fill #0

## 2024-07-06 MED ORDER — METOPROLOL TARTRATE 25 MG PO TABS
12.5000 mg | ORAL_TABLET | Freq: Two times a day (BID) | ORAL | 1 refills | Status: DC
  Filled 2024-07-06: qty 30, 30d supply, fill #0

## 2024-07-06 MED ORDER — AMIODARONE HCL 200 MG PO TABS
ORAL_TABLET | ORAL | 1 refills | Status: DC
  Filled 2024-07-06: qty 60, 53d supply, fill #0

## 2024-07-06 MED ORDER — ACETAMINOPHEN 500 MG PO TABS
500.0000 mg | ORAL_TABLET | Freq: Four times a day (QID) | ORAL | 0 refills | Status: AC | PRN
  Filled 2024-07-06: qty 30, 4d supply, fill #0

## 2024-07-06 NOTE — Progress Notes (Signed)
      [address] [place] [address]             [phone]         5 Days Post-Op Procedure(s) (LRB): REPLACEMENT, AORTIC VALVE, OPEN WITH INSPIRIS VALVE SIZE (N/A) ECHOCARDIOGRAM, TRANSESOPHAGEAL (N/A) CLIPPING, LEFT ATRIAL APPENDAGE USING  ATRICURE CLIP SIZE 45 (N/A)  Subjective:  Patient without complaints.  Feeling well and he has family to help at discharge.  He is ambulating and has moved his bowels  Objective: Vital signs in last 24 hours: Temp:  [97.5 F (36.4 C)-98.5 F (36.9 C)] 97.7 F (36.5 C) (11/01 0443) Pulse Rate:  [82-93] 82 (11/01 0443) Cardiac Rhythm: Normal sinus rhythm;Bundle branch block (10/31 1900) Resp:  [17-20] 17 (11/01 0443) BP: (114-150)/(73-85) 130/81 (11/01 0443) SpO2:  [95 %-99 %] 95 % (11/01 0443) Weight:  [120.8 kg] 120.8 kg (11/01 0443)  Intake/Output from previous day: 10/31 0701 - 11/01 0700 In: 480 [P.O.:480] Out: -   General appearance: alert, cooperative, and no distress Heart: regular rate and rhythm Lungs: clear to auscultation bilaterally Abdomen: soft, non-tender; bowel sounds normal; no masses,  no organomegaly Extremities: edema trace Wound: clean and dry  Lab Results: Recent Labs    07/05/24 0329 07/06/24 0317  WBC 8.6 7.5  HGB 11.2* 10.9*  HCT 33.1* 32.0*  PLT 180 206   BMET:  Recent Labs    07/05/24 0329 07/06/24 0317  NA 136 135  K 3.9 3.6  CL 100 100  CO2 23 25  GLUCOSE 131* 121*  BUN 12 14  CREATININE 0.92 0.92  CALCIUM 8.3* 8.1*    PT/INR:  Recent Labs    07/06/24 0317  LABPROT 14.2  INR 1.0   ABG    Component Value Date/Time   PHART 7.353 07/01/2024 1249   HCO3 21.5 07/01/2024 1249   TCO2 23 07/01/2024 1249   ACIDBASEDEF 4.0 (H) 07/01/2024 1249   O2SAT 99 07/01/2024 1249   CBG (last 3)  Recent Labs    07/05/24 1628 07/05/24 2140 07/06/24 0707  GLUCAP 121* 219* 122*    Assessment/Plan: S/P Procedure(s) (LRB): REPLACEMENT, AORTIC VALVE,  OPEN WITH INSPIRIS VALVE SIZE (N/A) ECHOCARDIOGRAM, TRANSESOPHAGEAL (N/A) CLIPPING, LEFT ATRIAL APPENDAGE USING  ATRICURE CLIP SIZE 45 (N/A)  CV- PAF, in NSR- continue Amiodarone, Lopressor.. home Coumadin  has been resumed.SABRA Carder- no acute issues, continue IS Renal- creatinine has been stable, weight is below baseline, Lasix has been discontinued DM-sugars are well controlled, continue home medications  Dispo- patient stable will d/c home today    LOS: 5 days    Rocky Shad, PA-C 07/06/2024 8:10 AM

## 2024-07-06 NOTE — Plan of Care (Signed)
  Problem: Education: Goal: Knowledge of General Education information will improve Description: Including pain rating scale, medication(s)/side effects and non-pharmacologic comfort measures Outcome: Adequate for Discharge   

## 2024-07-06 NOTE — Progress Notes (Signed)
 Occupational Therapy Treatment Patient Details Name: CRUISE BAUMGARDNER MRN: [id number] DOB: [birth year] Today's Date: 07/06/2024   History of present illness 63 y.o. male admitted 10/27 for and underwent elective aortic valve replacement, and Left atrial appendage clipping.  Past medical history of DM, and paroxysmal atrial flutter.   OT comments  Pt. Seen for skilled OT treatment session. Session focused on review and implementation of sternal and energy conservation strategies during ADL completion and mobility.  Pt. With good recall from previously instructed compensatory strategies for maintaining precautions during bed mobility.  Cont. To have good results with use of heart pillow as a reminder.  Reminders for precautions during portions of toileting tasks and for UB dressing.  Pt. With questions regarding diet and food changes.  Reviewed the heart book provided to him and how to access the sections via table of contents to locate those areas along with pictures, handouts, charts that show more information about diet recommendations.  Pt. Was receptive to this and all other education provided to him. Asking additional questions and wanting to make sure he understands everything moving forward in preparation for home.        If plan is discharge home, recommend the following:  A little help with walking and/or transfers;A little help with bathing/dressing/bathroom;Assistance with cooking/housework   Equipment Recommendations       Recommendations for Other Services      Precautions / Restrictions Precautions Precautions: Sternal Recall of Precautions/Restrictions: Intact Restrictions Other Position/Activity Restrictions: cardiac sternal precautions       Mobility Bed Mobility Overal bed mobility: Needs Assistance Bed Mobility: Rolling, Sidelying to Sit Rolling: Supervision Sidelying to sit: Min assist       General bed mobility comments: pt. wants hob elevated stating someone  told him to keep it elevated and states his bed can be elevated at home.  good recall not to reach for rail and held pillow during his rolling.  wanted demo and inst. of where his brothers are supposed to be postioned if he needed support transitioning into sitting.  reviewed hand placement and not to pull on eachothers arms/shoulders as a method.  pt. verbalized understanding    Transfers Overall transfer level: Needs assistance Equipment used: None Transfers: Sit to/from Stand, Bed to chair/wheelchair/BSC Sit to Stand: Supervision     Step pivot transfers: Supervision     General transfer comment: Supervision for safety, no assist or AD utilized, able to self steady.     Balance                                           ADL either performed or assessed with clinical judgement   ADL Overall ADL's : Needs assistance/impaired                 Upper Body Dressing : Cueing for compensatory techniques Upper Body Dressing Details (indicate cue type and reason): reviewed tech. for safe UB dressing while maintaining sternal precautions         Toileting- Clothing Manipulation and Hygiene: Supervision/safety;Sit to/from stand       Functional mobility during ADLs: Contact guard assist;Supervision/safety      Extremity/Trunk Assessment              Vision       Perception     Praxis     Communication Communication Communication: No apparent difficulties  Cognition Arousal: Alert Behavior During Therapy: WFL for tasks assessed/performed Cognition: No apparent impairments                               Following commands: Intact        Cueing   Cueing Techniques: Verbal cues  Exercises      Shoulder Instructions       General Comments  Provided and reviewed sternal precautions handout along with energy conservation strategies ( the P's)    Pertinent Vitals/ Pain       Pain Assessment Pain Assessment: No/denies  pain  Home Living                                          Prior Functioning/Environment              Frequency  Min 2X/week        Progress Toward Goals  OT Goals(current goals can now be found in the care plan section)  Progress towards OT goals: Progressing toward goals     Plan      Co-evaluation                 AM-PAC OT 6 Clicks Daily Activity     Outcome Measure   Help from another person eating meals?: None Help from another person taking care of personal grooming?: A Little Help from another person toileting, which includes using toliet, bedpan, or urinal?: A Little Help from another person bathing (including washing, rinsing, drying)?: A Little Help from another person to put on and taking off regular upper body clothing?: A Little Help from another person to put on and taking off regular lower body clothing?: A Little 6 Click Score: 19    End of Session    OT Visit Diagnosis: Unsteadiness on feet (R26.81);Other abnormalities of gait and mobility (R26.89);Muscle weakness (generalized) (M62.81)   Activity Tolerance Patient tolerated treatment well   Patient Left in chair;with call bell/phone within reach   Nurse Communication  (rn states ok to work with pt., alerted her of pts. question regarding the use of male performance enhancing gummies upon return home)        Time: [phone] OT Time Calculation (min): 21 min  Charges: OT General Charges $OT Visit: 1 Visit OT Treatments $Self Care/Home Management : 8-22 mins  Randall, COTA/L Acute Rehabilitation [phone]   CHRISTELLA Nest Lorraine-COTA/L  07/06/2024, 11:16 AM

## 2024-07-06 NOTE — Progress Notes (Signed)
 TOC pharm meds delivered to Patient at bedside.  Patient alert oriented and in good spirits.   Needs:  Nurse assessment, MD to order additional home meds, patient is diabetic and will eat breakfast before discharge.

## 2024-07-06 NOTE — Progress Notes (Signed)
 CARDIAC REHAB PHASE I   PRE:  Rate/Rhythm: 87 NSR  BP:  Sitting: 130/81      SpO2: 98% RA  MODE:  Ambulation: 470 ft    POST:  Rate/Rhythm: 97 NSR  BP:  Sitting: 137/94      SpO2: 98 RA  Pt amb with supervision assistance, pt denies CP and SOB during amb and was returned to room w/o complaint. Answered all pt questions on restrictions and site-care.   Garen FORBES Candy  MS, ACSM-CEP 9:26 AM 07/06/2024    Service time is from 0905 to 0926.

## 2024-07-08 ENCOUNTER — Emergency Department (HOSPITAL_COMMUNITY)
Admission: EM | Admit: 2024-07-08 | Discharge: 2024-07-08 | Disposition: A | Discharge status: Home / Self Care | Attending: Emergency Medicine | Admitting: Emergency Medicine

## 2024-07-08 ENCOUNTER — Encounter (HOSPITAL_COMMUNITY): Payer: Self-pay | Admitting: Emergency Medicine

## 2024-07-08 ENCOUNTER — Other Ambulatory Visit: Payer: Self-pay

## 2024-07-08 ENCOUNTER — Emergency Department (HOSPITAL_COMMUNITY)

## 2024-07-08 DIAGNOSIS — Z79899 Other long term (current) drug therapy: Secondary | ICD-10-CM | POA: Diagnosis not present

## 2024-07-08 DIAGNOSIS — E039 Hypothyroidism, unspecified: Secondary | ICD-10-CM | POA: Diagnosis not present

## 2024-07-08 DIAGNOSIS — E871 Hypo-osmolality and hyponatremia: Secondary | ICD-10-CM | POA: Insufficient documentation

## 2024-07-08 DIAGNOSIS — Z5181 Encounter for therapeutic drug level monitoring: Secondary | ICD-10-CM

## 2024-07-08 DIAGNOSIS — I10 Essential (primary) hypertension: Secondary | ICD-10-CM | POA: Diagnosis not present

## 2024-07-08 DIAGNOSIS — I482 Chronic atrial fibrillation, unspecified: Secondary | ICD-10-CM | POA: Diagnosis not present

## 2024-07-08 DIAGNOSIS — Z7984 Long term (current) use of oral hypoglycemic drugs: Secondary | ICD-10-CM | POA: Insufficient documentation

## 2024-07-08 DIAGNOSIS — R2243 Localized swelling, mass and lump, lower limb, bilateral: Secondary | ICD-10-CM | POA: Diagnosis present

## 2024-07-08 DIAGNOSIS — Z7901 Long term (current) use of anticoagulants: Secondary | ICD-10-CM | POA: Insufficient documentation

## 2024-07-08 DIAGNOSIS — Z794 Long term (current) use of insulin: Secondary | ICD-10-CM | POA: Diagnosis not present

## 2024-07-08 DIAGNOSIS — R600 Localized edema: Secondary | ICD-10-CM | POA: Diagnosis not present

## 2024-07-08 DIAGNOSIS — E119 Type 2 diabetes mellitus without complications: Secondary | ICD-10-CM | POA: Insufficient documentation

## 2024-07-08 LAB — COMPREHENSIVE METABOLIC PANEL WITH GFR
ALT: 8 U/L (ref 0–44)
AST: 13 U/L — ABNORMAL LOW (ref 15–41)
Albumin: 3.6 g/dL (ref 3.5–5.0)
Alkaline Phosphatase: 79 U/L (ref 38–126)
Anion gap: 6 (ref 5–15)
BUN: 12 mg/dL (ref 8–23)
CO2: 28 mmol/L (ref 22–32)
Calcium: 8.3 mg/dL — ABNORMAL LOW (ref 8.9–10.3)
Chloride: 100 mmol/L (ref 98–111)
Creatinine, Ser: 1.05 mg/dL (ref 0.61–1.24)
GFR, Estimated: >60 mL/min (ref >60)
Glucose, Bld: 150 mg/dL — ABNORMAL HIGH (ref 70–99)
Potassium: 4.2 mmol/L (ref 3.5–5.1)
Sodium: 134 mmol/L — ABNORMAL LOW (ref 135–145)
Total Bilirubin: 0.3 mg/dL (ref 0.0–1.2)
Total Protein: 6.6 g/dL (ref 6.5–8.1)

## 2024-07-08 LAB — CBC WITH DIFFERENTIAL/PLATELET
Abs Immature Granulocytes: 0.06 K/uL (ref 0.00–0.07)
Basophils Absolute: 0 K/uL (ref 0.0–0.1)
Basophils Relative: 0 %
Eosinophils Absolute: 0.2 K/uL (ref 0.0–0.5)
Eosinophils Relative: 2 %
HCT: 33.2 % — ABNORMAL LOW (ref 39.0–52.0)
Hemoglobin: 11.1 g/dL — ABNORMAL LOW (ref 13.0–17.0)
Immature Granulocytes: 1 %
Lymphocytes Relative: 15 %
Lymphs Abs: 1.3 K/uL (ref 0.7–4.0)
MCH: 27.9 pg (ref 26.0–34.0)
MCHC: 33.4 g/dL (ref 30.0–36.0)
MCV: 83.4 fL (ref 80.0–100.0)
Monocytes Absolute: 0.8 K/uL (ref 0.1–1.0)
Monocytes Relative: 9 %
Neutro Abs: 6.3 K/uL (ref 1.7–7.7)
Neutrophils Relative %: 73 %
Platelets: 259 K/uL (ref 150–400)
RBC: 3.98 MIL/uL — ABNORMAL LOW (ref 4.22–5.81)
RDW: 13.6 % (ref 11.5–15.5)
WBC: 8.7 K/uL (ref 4.0–10.5)
nRBC: 0 % (ref 0.0–0.2)

## 2024-07-08 LAB — PRO BRAIN NATRIURETIC PEPTIDE: Pro Brain Natriuretic Peptide: 476 pg/mL — ABNORMAL HIGH (ref <300.0)

## 2024-07-08 LAB — PROTIME-INR
INR: 1 (ref 0.8–1.2)
Prothrombin Time: 13.7 s (ref 11.4–15.2)

## 2024-07-08 MED ORDER — FUROSEMIDE 20 MG PO TABS: 20.0000 mg | ORAL_TABLET | Freq: Every day | ORAL | 0 refills | Status: DC

## 2024-07-08 NOTE — ED Triage Notes (Signed)
 Pt arrived via [facility] c/o bilateral LE swelling, pt recently had open heart surgery x 1 week ago, states he ate a bunch of cashews this morning, I forgot about the salt part denies any pain or shob at this time. SpO2 94% RA

## 2024-07-08 NOTE — ED Provider Notes (Signed)
 [place] [facility] DEPARTMENT AT [facility] Provider Note  CSN: [id number] Arrival date & time: 07/08/24 9292  Chief Complaint(s) Leg Swelling  HPI Johnathan Richmond is a 63 y.o. male with past medical history as below, significant for aortic stenosis status post aortic valve replacement 10/27 Dr. Shyrl, paroxysmal atrial fibrillation, type II DM, hypertension who presents to the ED with complaint of leg swelling  Patient had aortic valve replacement around a week ago, he has been doing well up until this morning.  He noticed that his legs were swelling after eating a large amount of cashews and reports the ER for evaluation.  Reports compliance with his medications including Coumadin .  No chest pain or dyspnea, no fevers or chills, no coughing, his appetite is within normal limits, no change in bowel or bladder function.  Only complaint is leg swelling  Past Medical History Past Medical History:  Diagnosis Date   Aortic stenosis    Diabetes mellitus (HCC)    Dyspnea    Essential hypertension    Heart murmur    heart murmur since childhood   Hypothyroidism    Morbid obesity (HCC)    Paroxysmal A-fib (HCC)    Paroxysmal atrial flutter [facility])    Patient Active Problem List   Diagnosis Date Noted   Paroxysmal atrial fibrillation (HCC) 07/04/2024   S/P aortic valve replacement with bioprosthetic valve 07/01/2024   Severe aortic stenosis 06/21/2024   Encounter for therapeutic drug monitoring 05/22/2024   Long term (current) use of anticoagulants 05/01/2024   Aortic stenosis 08/29/2019   History of atrial flutter 08/29/2019   Subclinical hypothyroidism 08/29/2019   Essential hypertension 08/29/2019   Type 2 diabetes mellitus (HCC) 08/29/2019   Home Medication(s) Prior to Admission medications   Medication Sig Start Date End Date Taking? Authorizing Provider  furosemide (LASIX) 20 MG tablet Take 1 tablet (20 mg total) by mouth daily for 3 days. 07/08/24 07/11/24 Yes  Elnor Savant A, DO  acetaminophen  (TYLENOL ) 500 MG tablet Take 1-2 tablets (500-1,000 mg total) by mouth every 6 (six) hours as needed. 07/06/24   Barrett, Rocky SAUNDERS, PA-C  amiodarone (PACERONE) 200 MG tablet Take 1 tablet (200 mg total) by mouth 2 (two) times daily for 7 days, THEN 1 tablet (200 mg total) daily. 07/06/24 08/28/24  Barrett, Erin R, PA-C  insulin  glargine (LANTUS  SOLOSTAR) 100 UNIT/ML Solostar Pen Inject 20 Units into the skin daily. 07/06/24   Barrett, Erin R, PA-C  Insulin  Pen Needle (PEN NEEDLES) 31G X 5 MM MISC 1 each by Does not apply route daily. May substitute to any manufacturer covered by patient's insurance. 05/02/24   Geroldine Berg, MD  metFORMIN  (GLUCOPHAGE -XR) 750 MG 24 hr tablet Take 1 tablet (750 mg total) by mouth daily with breakfast. 05/02/24   Geroldine Berg, MD  metoprolol tartrate (LOPRESSOR) 25 MG tablet Take 0.5 tablets (12.5 mg total) by mouth 2 (two) times daily. 07/06/24   Barrett, Erin R, PA-C  OVER THE COUNTER MEDICATION Take 1 tablet by mouth daily. Boost TRT Gummy    [provider]  oxyCODONE  (OXY IR/ROXICODONE ) 5 MG immediate release tablet Take 1 tablet (5 mg total) by mouth every 4 (four) hours as needed for severe pain (pain score 7-10). 07/06/24   Barrett, Erin R, PA-C  warfarin (COUMADIN ) 3 MG tablet Take 1 tablet (3 mg total) by mouth daily. 04/30/24   Mallipeddi, Diannah SQUIBB, MD  Past Surgical History Past Surgical History:  Procedure Laterality Date   AORTIC VALVE REPLACEMENT N/A 07/01/2024   Procedure: REPLACEMENT, AORTIC VALVE, OPEN WITH INSPIRIS VALVE SIZE ;  Surgeon: Shyrl Linnie KIDD, MD;  Location: [facility] OR;  Service: Open Heart Surgery;  Laterality: N/A;   CHOLECYSTECTOMY     CLIPPING OF ATRIAL APPENDAGE N/A 07/01/2024   Procedure: CLIPPING, LEFT ATRIAL APPENDAGE USING  ATRICURE CLIP SIZE 45;  Surgeon:  Shyrl Linnie KIDD, MD;  Location: [facility] OR;  Service: Open Heart Surgery;  Laterality: N/A;   CORONARY ANGIOGRAPHY N/A 06/06/2024   Procedure: CORONARY ANGIOGRAPHY;  Surgeon: Verlin Lonni BIRCH, MD;  Location: [facility] INVASIVE CV LAB;  Service: Cardiovascular;  Laterality: N/A;   TEE WITHOUT CARDIOVERSION N/A 07/01/2024   Procedure: ECHOCARDIOGRAM, TRANSESOPHAGEAL;  Surgeon: Shyrl Linnie KIDD, MD;  Location: [facility] OR;  Service: Open Heart Surgery;  Laterality: N/A;   Family History Family History  Problem Relation Age of Onset   Schizophrenia Mother    COPD Mother    Epilepsy Father    Schizophrenia Brother     Social History Social History   Tobacco Use   Smoking status: Never   Smokeless tobacco: Never  Vaping Use   Vaping status: Never Used  Substance Use Topics   Alcohol use: No   Drug use: No   Allergies Patient has no known allergies.  Review of Systems A thorough review of systems was obtained and all systems are negative except as noted in the HPI and PMH.   Physical Exam Vital Signs  I have reviewed the triage vital signs BP (!) 151/81   Pulse 86   Temp 97.9 F (36.6 C)   Resp (!) 22   SpO2 98%  Physical Exam Vitals and nursing note reviewed.  Constitutional:      General: He is not in acute distress.    Appearance: He is well-developed.  HENT:     Head: Normocephalic and atraumatic.     Right Ear: External ear normal.     Left Ear: External ear normal.     Mouth/Throat:     Mouth: Mucous membranes are moist.  Eyes:     General: No scleral icterus. Cardiovascular:     Rate and Rhythm: Normal rate and regular rhythm.     Pulses: Normal pulses.  Pulmonary:     Effort: Pulmonary effort is normal. No respiratory distress.  Chest:    Abdominal:     General: Abdomen is flat.     Palpations: Abdomen is soft.     Tenderness: There is no abdominal tenderness.  Musculoskeletal:     Cervical back: No rigidity.     Right lower leg: Edema present.      Left lower leg: Edema present.  Skin:    General: Skin is warm and dry.     Capillary Refill: Capillary refill takes less than 2 seconds.  Neurological:     Mental Status: He is alert.  Psychiatric:        Mood and Affect: Mood normal.        Behavior: Behavior normal.     ED Results and Treatments Labs (all labs ordered are listed, but only abnormal results are displayed) Labs Reviewed  CBC WITH DIFFERENTIAL/PLATELET - Abnormal; Notable for the following components:      Result Value   RBC 3.98 (*)    Hemoglobin 11.1 (*)    HCT 33.2 (*)    All other components within normal limits  PRO BRAIN  NATRIURETIC PEPTIDE - Abnormal; Notable for the following components:   Pro Brain Natriuretic Peptide 476.0 (*)    All other components within normal limits  COMPREHENSIVE METABOLIC PANEL WITH GFR - Abnormal; Notable for the following components:   Sodium 134 (*)    Glucose, Bld 150 (*)    Calcium 8.3 (*)    AST 13 (*)    All other components within normal limits  PROTIME-INR                                                                                                                          Radiology DG Chest Portable 1 View Result Date: 07/08/2024 EXAM: 1 VIEW XRAY OF THE CHEST 07/08/2024 07:35:00 AM COMPARISON: 07/03/2024 CLINICAL HISTORY: peripheral edema, recent aortic valve replacement FINDINGS: LINES, TUBES AND DEVICES: Interval removal of right internal jugular central venous catheter, left mediastinal drain, and right chest tube. LUNGS AND PLEURA: Small left pleural effusion, unchanged. Left lung base opacity, likely atelectasis. No pneumothorax. No pulmonary edema. HEART AND MEDIASTINUM: Mild cardiomegaly. Aortic valve replacement and left atrial clipping noted. BONES AND SOFT TISSUES: Median sternotomy noted. No acute osseous abnormality. IMPRESSION: 1. Small left pleural effusion, unchanged. Electronically signed by: Waddell Calk MD 07/08/2024 07:46 AM EST RP Workstation:  [id number]    Pertinent labs & imaging results that were available during my care of the patient were reviewed by me and considered in my medical decision making (see MDM for details).  Medications Ordered in ED Medications - No data to display                                                                                                                                   Procedures Procedures  (including critical care time)  Medical Decision Making / ED Course    Medical Decision Making:    JAMAAR HOWES is a 63 y.o. male with past medical history as below, significant for aortic stenosis status post aortic valve replacement 10/27 Dr. Shyrl, paroxysmal atrial fibrillation, type II DM, hypertension who presents to the ED with complaint of leg swelling. The complaint involves an extensive differential diagnosis and also carries with it a high risk of complications and morbidity.  Serious etiology was considered. Ddx includes but is not limited to: Fluid overload, CHF, postoperative complication, VTE, infection, etc.  Complete initial physical exam performed, notably the patient was in no acute  distress, breathing comfortably on ambient air.    Reviewed and confirmed nursing documentation for past medical history, family history, social history.  Vital signs reviewed.    Lower extremity swelling> - Bilateral leg swelling, pitting 2+, symmetric.  No erythema or induration, doubt cellulitis - Labs stable - X-ray stable - Patient with lower extremity edema likely secondary to excessive salt intake or secondary to recent cardiac surgery. No unilateral swelling, dvt seems less likely though he is subtherapeutic on his warfarin, he reports he is not picked up yet from the pharmacy.  He will go this morning to pick up the medication..  No respiratory complaints, BNP is stable, chest x-ray stable - Upon recheck he reports he is feeling better, he thinks his leg swelling has also started  improving. - Start patient on short course of Lasix, encouraged him to adhere to dietary restrictions.  Elevate legs.  Follow-up with his CT surgery team/ pcp        10:02 AM:  I have discussed the diagnosis/risks/treatment options with the patient.  Evaluation and diagnostic testing in the emergency department does not suggest an emergent condition requiring admission or immediate intervention beyond what has been performed at this time.  They will follow up with surgical primary team, PCP. We also discussed returning to the ED immediately if new or worsening sx occur. We discussed the sx which are most concerning (e.g., sudden worsening pain, fever, inability to tolerate by mouth) that necessitate immediate return.    The patient appears reasonably screened and/or stabilized for discharge and I doubt any other medical condition or other [facility] requiring further screening, evaluation, or treatment in the ED at this time prior to discharge.                 Additional history obtained: -Additional history obtained from na -External records from outside source obtained and reviewed including: Chart review including previous notes, labs, imaging, consultation notes including  Prior admission, home medications   Lab Tests: -I ordered, reviewed, and interpreted labs.   The pertinent results include:   Labs Reviewed  CBC WITH DIFFERENTIAL/PLATELET - Abnormal; Notable for the following components:      Result Value   RBC 3.98 (*)    Hemoglobin 11.1 (*)    HCT 33.2 (*)    All other components within normal limits  PRO BRAIN NATRIURETIC PEPTIDE - Abnormal; Notable for the following components:   Pro Brain Natriuretic Peptide 476.0 (*)    All other components within normal limits  COMPREHENSIVE METABOLIC PANEL WITH GFR - Abnormal; Notable for the following components:   Sodium 134 (*)    Glucose, Bld 150 (*)    Calcium 8.3 (*)    AST 13 (*)    All other components within normal  limits  PROTIME-INR    Notable for labs are stable  EKG   EKG Interpretation Date/Time:    Ventricular Rate:    PR Interval:    QRS Duration:    QT Interval:    QTC Calculation:   R Axis:      Text Interpretation:           Imaging Studies ordered: I ordered imaging studies including chest x-ray I independently visualized the following imaging with scope of interpretation limited to determining acute life threatening conditions related to emergency care; findings noted above I agree with the radiologist interpretation If any imaging was obtained with contrast I closely monitored patient for any possible adverse reaction a/w contrast  administration in the emergency department   Medicines ordered and prescription drug management: Meds ordered this encounter  Medications   furosemide (LASIX) 20 MG tablet    Sig: Take 1 tablet (20 mg total) by mouth daily for 3 days.    Dispense:  3 tablet    Refill:  0    -I have reviewed the patients home medicines and have made adjustments as needed   Consultations Obtained: Not applicable  Cardiac Monitoring: The patient was maintained on a cardiac monitor.  I personally viewed and interpreted the cardiac monitored which showed an underlying rhythm of: NSR Continuous pulse oximetry interpreted by myself, 99% on room air.    Social Determinants of Health:  Diagnosis or treatment significantly limited by social determinants of health: obesity   Reevaluation: After the interventions noted above, I reevaluated the patient and found that they have improved  Co morbidities that complicate the patient evaluation  Past Medical History:  Diagnosis Date   Aortic stenosis    Diabetes mellitus (HCC)    Dyspnea    Essential hypertension    Heart murmur    heart murmur since childhood   Hypothyroidism    Morbid obesity (HCC)    Paroxysmal A-fib (HCC)    Paroxysmal atrial flutter (HCC)       Dispostion: Disposition decision  including need for hospitalization was considered, and patient discharged from emergency department.    Final Clinical Impression(s) / ED Diagnoses Final diagnoses:  Peripheral edema  Subtherapeutic anticoagulation        Elnor Jayson LABOR, DO 07/08/24 1002

## 2024-07-08 NOTE — Discharge Instructions (Addendum)
 It was a pleasure caring for you today in the emergency department.  Please follow a low-salt diet.  Please keep your legs elevated when sitting down or sleeping.  Please call your PCP/heart doctors to arrange follow-up and recheck of your leg swelling.  Be sure to get your warfarin prescription and take it as prescribed  Please return to the emergency department for any worsening or worrisome symptoms including not limited to worsening swelling, difficulty breathing, chest pain, etc.

## 2024-07-08 NOTE — ED Notes (Signed)
 Pt took his medication at bedside from home. Pt took oxycodone  5 mg, tylenol  500mg , amiodarone 200 mg, metoprolol 12.5 mg

## 2024-07-08 NOTE — ED Notes (Signed)
 Pt states he has not taken his coumadin  since saturday

## 2024-07-10 ENCOUNTER — Encounter: Payer: Self-pay | Admitting: *Deleted

## 2024-07-10 ENCOUNTER — Ambulatory Visit: Attending: Physician Assistant

## 2024-07-12 ENCOUNTER — Ambulatory Visit
Payer: Self-pay | Attending: Thoracic Surgery (Cardiothoracic Vascular Surgery) | Admitting: Thoracic Surgery (Cardiothoracic Vascular Surgery)

## 2024-07-12 DIAGNOSIS — Z953 Presence of xenogenic heart valve: Secondary | ICD-10-CM

## 2024-07-12 NOTE — Progress Notes (Signed)
     [address] [place] [address]             [phone]      Patient: Home Provider: Office Consent for Telemedicine visit obtained.  Today's visit was completed via a real-time telehealth (see specific modality noted below). The patient/authorized person provided oral consent at the time of the visit to engage in a telemedicine encounter with the present provider at [facility]. The patient/authorized person was informed of the potential benefits, limitations, and risks of telemedicine. The patient/authorized person expressed understanding that the laws that protect confidentiality also apply to telemedicine. The patient/authorized person acknowledged understanding that telemedicine does not provide emergency services and that he or she would need to call 911 or proceed to the nearest hospital for help if such a need arose.   Total time spent in the clinical discussion 10 minutes.  Telehealth Modality: Phone visit (audio only)  I had a telephone visit with  Johnathan Richmond who is s/p AVR.  Overall doing well.  Pain is minimal.  Ambulating well. Vitals have been stable.  He did go to the ED on 11/3 for low extremity swelling after have salted cashews.  This has since resolved.  Johnathan Richmond will see us  back in 1 month with a chest x-ray for cardiac rehab clearance.  [name]

## 2024-07-15 MED FILL — Heparin Sodium (Porcine) Inj 1000 Unit/ML: Qty: 1000 | Status: AC

## 2024-07-15 MED FILL — Heparin Sodium (Porcine) Inj 1000 Unit/ML: INTRAMUSCULAR | Qty: 2500 | Status: AC

## 2024-07-15 MED FILL — Lidocaine HCl Local Preservative Free (PF) Inj 2%: INTRAMUSCULAR | Qty: 14 | Status: AC

## 2024-07-15 MED FILL — Potassium Chloride Inj 2 mEq/ML: INTRAVENOUS | Qty: 40 | Status: AC

## 2024-07-23 NOTE — Progress Notes (Unsigned)
 Cardiology Office Note    Date:  07/24/2024  ID:  Johnathan Richmond, DOB Jan 06, 1961, MRN [id number] Cardiologist: Diannah SHAUNNA Maywood, MD Structural Heart:  [name], MD{ :  History of Present Illness:    Johnathan Richmond is a 63 y.o. male with past medical history of paroxysmal atrial flutter (diagnosed in 2018), Type 2 DM and severe aortic stenosis who presents to the office today for hospital follow-up following recent AVR.  He was previously referred to the [facility] by Dr. Mallipeddi for severe aortic stenosis and given his age and functional capacity, was felt to best be treated with surgical AVR. Cardiac catheterization in 06/2024 showed no angiographic evidence of CAD. He did meet with CT Surgery as an outpatient and ultimately was admitted to [facility] on 07/01/2024 for aortic valve replacement with a 25 mm Inspiris valve and left atrial appendage clipping. He did have episodes of atrial fibrillation following surgery and was started on IV Amiodarone. He did convert back to normal sinus rhythm with this and was discharged on Amiodarone 200 mg twice daily for 7 days then 200 mg daily. Was continued on Lopressor 12.5 mg twice daily and Coumadin  for anticoagulation.  He was evaluated at [facility] ED on 07/08/2024 for worsening lower extremity edema and reported an increase in sodium intake as well. proBNP was elevated at 476 and he was prescribed a short course of Lasix to take at home for 3 days. He did have a follow-up telehealth visit with CT Surgery on 07/12/2024 and no changes were made at that time.  In talking the patient today, he reports overall feeling well since his recent surgery. Reports his pain is overall mild and typically only worse with coughing. Has been taking Tylenol  as needed. He has been walking several times a day for exercise and does so in his apartment when the weather does not allow for him to go outside. He denies any exertional chest  pain and reports his breathing has significantly improved. No recent orthopnea or PND. No recurrent lower extremity edema after finishing his course of Lasix. Says that he was consuming salted peanuts around that timeframe and has now tried to limit his sodium intake. He remains on Coumadin  for anticoagulation and reports compliance with this. Did miss his most recent Coumadin  check. He also has difficulty cutting his Lopressor tablets in half.   Studies Reviewed:   EKG: EKG is ordered today and demonstrates:   EKG Interpretation Date/Time:  Wednesday July 24 2024 08:06:23 EST Ventricular Rate:  76 PR Interval:  228 QRS Duration:  132 QT Interval:  410 QTC Calculation: 461 R Axis:   -36  Text Interpretation: Sinus rhythm with 1st degree A-V block Left axis deviation Right bundle branch block When compared with ECG of 03-Jul-2024 09:37, Sinus rhythm has replaced Atrial flutter Confirmed by Santana Grate ([id number]) on 07/24/2024 8:38:58 AM       Cardiac Catheterization: 06/2024 No angiographic evidence of CAD   Recommendations: Continue workup for AVR.    TEE: 06/2024 POST-OP IMPRESSIONS  _ Left Ventricle: The left ventricle is unchanged from pre-bypass.  _ Right Ventricle: The right ventricle appears unchanged from pre-bypass.  _ Aorta: The aorta appears unchanged from pre-bypass.  _ Left Atrial Appendage: The left atrial appendage is not visualized.  _ Aortic Valve: No stenosis present. There is no regurgitation. The  gradient  recorded across the prosthetic valve is within the expected range. No  perivalvular leak noted.  _  Mitral Valve: The mitral valve appears unchanged from pre-bypass.  _ Tricuspid Valve: The tricuspid valve appears unchanged from pre-bypass.  _ Pulmonic Valve: The pulmonic valve appears unchanged from pre-bypass.  _ Interatrial Septum: The interatrial septum appears unchanged from  pre-bypass.  _ Pericardium: The pericardium appears unchanged from  pre-bypass.    Risk Assessment/Calculations:   CHA2DS2-VASc Score = 2   This indicates a 2.2% annual risk of stroke.  The patient's score is based upon: CHF History: 0 HTN History: 1 Diabetes History: 1 Stroke History: 0 Vascular Disease History: 0 Age Score: 0 Gender Score: 0   Physical Exam:   VS:  BP 130/80 (BP Location: Right Arm, Cuff Size: Normal)   Pulse 79   Ht 6' 2 (1.88 m)   Wt 264 lb (119.7 kg)   SpO2 96%   BMI 33.90 kg/m    Wt Readings from Last 3 Encounters:  07/24/24 264 lb (119.7 kg)  07/06/24 266 lb 5.1 oz (120.8 kg)  06/28/24 270 lb 14.4 oz (122.9 kg)     GEN: Well nourished, well developed male appearing in no acute distress NECK: No JVD; No carotid bruits CARDIAC: RRR, no murmurs, rubs, gallops. Sternal site appears to be healing well with no significant erythema or drainage. RESPIRATORY:  Clear to auscultation without rales, wheezing or rhonchi  ABDOMEN: Appears non-distended. No obvious abdominal masses. EXTREMITIES: No clubbing or cyanosis. No pitting edema.  Distal pedal pulses are 2+ bilaterally.   Assessment and Plan:   1. S/P aortic valve replacement - He recently underwent AVR with a 25 mm Inspiris valve and left atrial appendage clipping on 07/01/2024. He is overall progressing well and reports improvement in his respiratory status and energy level. He is scheduled for a follow-up echocardiogram on 08/13/2024 and was encouraged to keep this. He is interested in proceeding with cardiac rehab and I informed him they will likely be reaching out to arrange this after his follow-up appointment with CT Surgery on 08/16/2024.  2. History of atrial flutter/Post-operative Atrial Fibrillation - He has a history of paroxysmal atrial flutter previously occurring in 2018 and did have postoperative atrial fibrillation/flutter following recent AVR. He did convert back to normal sinus rhythm with Amiodarone during admission and is maintaining normal sinus  rhythm by examination and repeat EKG today. Will have him complete a 67-month course of Amiodarone and then stop on 08/31/2024.  He is having difficulty cutting the Lopressor tablets, therefore will stop Lopressor 12.5 mg twice daily and switch to Toprol-XL 25 mg daily. - He is on Coumadin  for anticoagulation and INR was at 1.0 when checked on 07/08/2024. He did have a follow-up INR check on 07/10/2024 but no-showed for this. Will recheck an INR today and reach out to [employer] in regards to results. It is typically recommend to be on Coumadin  for at least 3 months following bioprosthetic AVR, therefore will check with CT Surgery to see if switching to Eliquis  after this timeframe is an option since he was initially on anticoagulation for atrial fibrillation. Says he now has insurance coverage but would still check with Pharmacy before prescribing since this was previously cost-prohibitive.    Cardiac Rehabilitation Eligibility Assessment  The patient is ready to start cardiac rehabilitation pending clearance from the cardiac surgeon.     Signed, Laymon CHRISTELLA Qua, PA-C

## 2024-07-24 ENCOUNTER — Ambulatory Visit: Payer: Self-pay | Admitting: Student

## 2024-07-24 ENCOUNTER — Ambulatory Visit: Attending: Student | Admitting: Student

## 2024-07-24 ENCOUNTER — Ambulatory Visit (INDEPENDENT_AMBULATORY_CARE_PROVIDER_SITE_OTHER): Payer: Self-pay | Admitting: *Deleted

## 2024-07-24 ENCOUNTER — Other Ambulatory Visit (HOSPITAL_COMMUNITY)
Admission: RE | Admit: 2024-07-24 | Discharge: 2024-07-24 | Disposition: A | Discharge status: Home / Self Care | Source: Ambulatory Visit | Attending: Student | Admitting: Student

## 2024-07-24 ENCOUNTER — Encounter: Payer: Self-pay | Admitting: Student

## 2024-07-24 VITALS — BP 130/80 | HR 79 | Ht 74.0 in | Wt 264.0 lb

## 2024-07-24 DIAGNOSIS — Z952 Presence of prosthetic heart valve: Secondary | ICD-10-CM | POA: Insufficient documentation

## 2024-07-24 DIAGNOSIS — Z8679 Personal history of other diseases of the circulatory system: Secondary | ICD-10-CM | POA: Diagnosis not present

## 2024-07-24 DIAGNOSIS — Z5181 Encounter for therapeutic drug level monitoring: Secondary | ICD-10-CM | POA: Diagnosis not present

## 2024-07-24 LAB — CBC
HCT: 39.4 % (ref 39.0–52.0)
Hemoglobin: 12.7 g/dL — ABNORMAL LOW (ref 13.0–17.0)
MCH: 27.2 pg (ref 26.0–34.0)
MCHC: 32.2 g/dL (ref 30.0–36.0)
MCV: 84.4 fL (ref 80.0–100.0)
Platelets: 318 K/uL (ref 150–400)
RBC: 4.67 MIL/uL (ref 4.22–5.81)
RDW: 13.9 % (ref 11.5–15.5)
WBC: 4.3 K/uL (ref 4.0–10.5)
nRBC: 0 % (ref 0.0–0.2)

## 2024-07-24 LAB — BASIC METABOLIC PANEL WITH GFR
Anion gap: 9 (ref 5–15)
BUN: 11 mg/dL (ref 8–23)
CO2: 27 mmol/L (ref 22–32)
Calcium: 8.9 mg/dL (ref 8.9–10.3)
Chloride: 105 mmol/L (ref 98–111)
Creatinine, Ser: 0.99 mg/dL (ref 0.61–1.24)
GFR, Estimated: >60 mL/min (ref >60)
Glucose, Bld: 99 mg/dL (ref 70–99)
Potassium: 4.8 mmol/L (ref 3.5–5.1)
Sodium: 140 mmol/L (ref 135–145)

## 2024-07-24 LAB — PROTIME-INR
INR: 1.7 — ABNORMAL HIGH (ref 0.8–1.2)
Prothrombin Time: 20.5 s — ABNORMAL HIGH (ref 11.4–15.2)

## 2024-07-24 MED ORDER — AMIODARONE HCL 200 MG PO TABS: 200.0000 mg | ORAL_TABLET | Freq: Every day | ORAL | 0 refills | Status: AC

## 2024-07-24 MED ORDER — METOPROLOL SUCCINATE ER 25 MG PO TB24: 25.0000 mg | ORAL_TABLET | Freq: Every day | ORAL | 3 refills | Status: AC

## 2024-07-24 NOTE — Patient Instructions (Signed)
 Medication Instructions:   Stop Amiodarone  August 31, 2024  Stop Taking Lopressor  12.5 mg   Start Taking Toprol  XL 25 mg Daily   *If you need a refill on your cardiac medications before your next appointment, please call your pharmacy*  Lab Work: Your physician recommends that you return for lab work. Today   Please have this done at [facility]. (hours-Monday through Friday from 8:00 am to 4:00 pm except 11:30 am to 12:10 pm)  If you have labs (blood work) drawn today and your tests are completely normal, you will receive your results only by: MyChart Message (if you have MyChart) OR A paper copy in the mail If you have any lab test that is abnormal or we need to change your treatment, we will call you to review the results.  Testing/Procedures: Your physician has requested that you have an echocardiogram. Echocardiography is a painless test that uses sound waves to create images of your heart. It provides your doctor with information about the size and shape of your heart and how well your heart's chambers and valves are working. This procedure takes approximately one hour. There are no restrictions for this procedure. Please do NOT wear cologne, perfume, aftershave, or lotions (deodorant is allowed). Please arrive 15 minutes prior to your appointment time.  Please note: We ask at that you not bring children with you during ultrasound (echo/ vascular) testing. Due to room size and safety concerns, children are not allowed in the ultrasound rooms during exams. Our front office staff cannot provide observation of children in our lobby area while testing is being conducted. An adult accompanying a patient to their appointment will only be allowed in the ultrasound room at the discretion of the ultrasound technician under special circumstances. We apologize for any inconvenience.   Follow-Up: At [facility], you and your health needs are our priority.  As part of our  continuing mission to provide you with exceptional heart care, our providers are all part of one team.  This team includes your primary Cardiologist (physician) and Advanced Practice Providers or APPs (Physician Assistants and Nurse Practitioners) who all work together to provide you with the care you need, when you need it.  Your next appointment:   3 month(s)  Provider:   You may see Vishnu P Mallipeddi, MD or one of the following Advanced Practice Providers on your designated Care Team:   Brittany Strader, PA-C  Scotesia Raub, [place] Olivia Pavy, [place]     We recommend signing up for the patient portal called MyChart.  Sign up information is provided on this After Visit Summary.  MyChart is used to connect with patients for Virtual Visits (Telemedicine).  Patients are able to view lab/test results, encounter notes, upcoming appointments, etc.  Non-urgent messages can be sent to your provider as well.   To learn more about what you can do with MyChart, go to [email or web address].   Other Instructions Thank you for choosing [facility]!

## 2024-07-24 NOTE — Patient Instructions (Signed)
 INR drawn at [facility] today. Spoke with patient.  He states he only took 6mg  x 1 wk then went back to 3mg  daily. Told pt to increase warfarin to 1 tablet daily except 2 tablets on Mondays, Wednesdays and Fridays . Recheck in 2 wk

## 2024-07-24 NOTE — Progress Notes (Signed)
 INR 1.7; Please see anticoagulation encounter

## 2024-08-07 ENCOUNTER — Ambulatory Visit: Attending: Internal Medicine | Admitting: *Deleted

## 2024-08-07 DIAGNOSIS — Z8679 Personal history of other diseases of the circulatory system: Secondary | ICD-10-CM

## 2024-08-07 DIAGNOSIS — Z5181 Encounter for therapeutic drug level monitoring: Secondary | ICD-10-CM

## 2024-08-07 LAB — POCT INR: INR: 1.9 — AB (ref 2.0–3.0)

## 2024-08-07 MED ORDER — WARFARIN SODIUM 3 MG PO TABS: ORAL_TABLET | ORAL | 3 refills | Status: AC

## 2024-08-07 NOTE — Patient Instructions (Signed)
 Pt has been taking 1 tablet (3mg ) daily Start warfarin 1 tablet daily except 2 tablets on Sundays and Wednesday. Recheck in 2 wk

## 2024-08-07 NOTE — Progress Notes (Signed)
 INR 1.9; Please see anticoagulation encounter

## 2024-08-13 ENCOUNTER — Ambulatory Visit (HOSPITAL_COMMUNITY): Admission: RE | Admit: 2024-08-13 | Discharge: 2024-08-13 | Attending: Physician Assistant

## 2024-08-13 DIAGNOSIS — Z952 Presence of prosthetic heart valve: Secondary | ICD-10-CM

## 2024-08-13 LAB — ECHOCARDIOGRAM COMPLETE
AV Mean grad: 9 mmHg
AV Peak grad: 15.2 mmHg
Ao pk vel: 1.95 m/s
Area-P 1/2: 3.42 cm2
S' Lateral: 2.7 cm

## 2024-08-13 NOTE — Progress Notes (Signed)
*  PRELIMINARY RESULTS* Echocardiogram 2D Echocardiogram has been performed.  Teresa Aida PARAS 08/13/2024, 10:53 AM

## 2024-08-15 ENCOUNTER — Other Ambulatory Visit: Payer: Self-pay | Admitting: Thoracic Surgery (Cardiothoracic Vascular Surgery)

## 2024-08-15 ENCOUNTER — Ambulatory Visit: Payer: Self-pay | Admitting: Internal Medicine

## 2024-08-15 DIAGNOSIS — Z953 Presence of xenogenic heart valve: Secondary | ICD-10-CM

## 2024-08-16 ENCOUNTER — Ambulatory Visit
Admission: RE | Admit: 2024-08-16 | Discharge: 2024-08-16 | Disposition: A | Source: Ambulatory Visit | Attending: Cardiology | Admitting: Cardiology

## 2024-08-16 ENCOUNTER — Ambulatory Visit

## 2024-08-16 VITALS — BP 142/84 | HR 77 | Resp 18 | Ht 74.0 in | Wt 268.0 lb

## 2024-08-16 DIAGNOSIS — Z953 Presence of xenogenic heart valve: Secondary | ICD-10-CM

## 2024-08-16 NOTE — Telephone Encounter (Signed)
-----   Message from Vishnu Mallipeddi, MD sent at 08/15/2024 12:58 PM EST ----- Normal LV function, indeterminate diastology, normal RV function, normal aortic prosthesis with mean PG 9 mmHg and CVP 3 mmHg.  Overall, normal echocardiogram.

## 2024-08-16 NOTE — Progress Notes (Signed)
 [address] [place] [address]             [phone]       HPI:  Patient returns for routine postoperative follow-up having undergone Aortic valve replacement with a 25mm Inspiris valve and Placement of a 45mm Atriaclip on 07/01/2024 with Dr. Shyrl.  The patient's early postoperative recovery while in the hospital was notable for developing atrial flutter/fibrillation and was placed on amiodarone .  He chemically cardioverted back to normal sinus rhythm.  He was routinely diuresed.  He was stable for discharge home on 07/06/2024.  Since hospital discharge the patient reports that he has been doing well.  He was seen in the emergency department on 07/08/2024 due to lower extremity edema due to salt intake.  He was started on a short course of Lasix  which resolved edema.  He has been very active after surgery and walking.  His incisions have been healing appropriately.  He denies chest pain, shortness of breath or lower leg swelling..  Allergies as of 08/16/2024   No Known Allergies      Medication List        Accurate as of August 16, 2024 11:23 AM. If you have any questions, ask your nurse or doctor.          Acetaminophen  Extra Strength 500 MG Tabs Take 1-2 tablets (500-1,000 mg total) by mouth every 6 (six) hours as needed.   amiodarone  200 MG tablet Commonly known as: PACERONE  Take 1 tablet (200 mg total) by mouth daily.   Lantus  SoloStar 100 UNIT/ML Solostar Pen Generic drug: insulin  glargine Inject 20 Units into the skin daily.   metFORMIN  750 MG 24 hr tablet Commonly known as: GLUCOPHAGE -XR Take 1 tablet (750 mg total) by mouth daily with breakfast.   metoprolol  succinate 25 MG 24 hr tablet Commonly known as: Toprol  XL Take 1 tablet (25 mg total) by mouth daily.   OVER THE COUNTER MEDICATION Take 1 tablet by mouth daily. Boost TRT Gummy   Pen Needles 31G X 5 MM Misc 1 each by Does not apply route daily. May substitute to  any manufacturer covered by patient's insurance.   warfarin 3 MG tablet Commonly known as: COUMADIN  Take as directed by the anticoagulation clinic. If you are unsure how to take this medication, talk to your nurse or doctor. Original instructions: Take 1 to 2 tablets daily as directed by coumadin  clinic         ROS  Review of Systems  Constitutional:  Negative for malaise/fatigue.  Respiratory:  Negative for cough and shortness of breath.   Cardiovascular:  Negative for chest pain, palpitations and leg swelling.     BP (!) 142/84 (BP Location: Right Arm)   Pulse 77   Resp 18   Ht 6' 2 (1.88 m)   Wt 268 lb (121.6 kg)   SpO2 98%   BMI 34.41 kg/m   Physical Exam Constitutional:      Appearance: Normal appearance.  HENT:     Head: Normocephalic and atraumatic.  Cardiovascular:     Rate and Rhythm: Normal rate and regular rhythm.     Heart sounds: Normal heart sounds, S1 normal and S2 normal.  Pulmonary:     Effort: Pulmonary effort is normal.     Breath sounds: Normal breath sounds.  Skin:    General: Skin is warm and dry.      Neurological:  General: No focal deficit present.     Mental Status: He is alert and oriented to person, place, and time.       Imaging: EXAM: 2 VIEW(S) XRAY OF THE CHEST 08/16/2024 10:43:51 AM   COMPARISON: 07/08/2024   CLINICAL HISTORY: Aortic Valve Replacement.   FINDINGS:   LINES, TUBES AND DEVICES: Atrial occlusion clip noted. Prosthetic aortic valve.   LUNGS AND PLEURA: Improved aeration at the left lung base. No focal pulmonary opacity. No pleural effusion. No pneumothorax.   HEART AND MEDIASTINUM: Prosthetic aortic valve. No acute abnormality of the cardiac and mediastinal silhouettes.   BONES AND SOFT TISSUES: Median sternotomy noted. Thoracic spondylosis.   IMPRESSION: 1. Improved aeration at the left lung base. 2. Prosthetic aortic valve. 3. Atrial occlusion clip. 4. Thoracic spondylosis.    Electronically signed by: Ryan Salvage MD 08/16/2024 11:35 AM EST RP Workstation: [id number]   Assessment/Plan:  S/P aortic valve replacement with bioprosthetic valve -We reviewed today's chest x ray.  - We discussed driving and he is cleared to do so at this time.  First time driving should be a short distance in the daytime and he can slowly increase from there. -Discussed that his sternal precautions can be lifted since he is a full 6 weeks from surgery.  He should continue to not to lift over 10 pounds until a full 3 months from surgery -He should continue to increase his activity/exercise as tolerated -Incision site healed and he should continue to keep clean and dry -He is cleared from a surgical standpoint to participate in cardiac rehab -Discussed the need for prophylactic antibiotics for all dental procedures/cleanings and minor surgeries -He is followed by the anticoagulation clinic for his Coumadin .  Last INR was 1.9.  He is to continue to follow-up with this clinic and make adjustments to medication as instructed -He has been seen by cardiology and he is to continue amiodarone  until 08/31/2024.  Patient continue with metoprolol  25 mg daily. - Follow-up with [facility] as needed    Manuelita CHRISTELLA Rough, PA-C 11:23 AM 08/16/2024

## 2024-08-16 NOTE — Patient Instructions (Signed)
 You may return to driving an automobile as long as you are no longer requiring oral narcotic pain relievers during the daytime.  It would be wise to start driving only short distances during the daylight and gradually increase from there as you feel comfortable.   You may continue to gradually increase your physical activity as tolerated.  Refrain from any heavy lifting or strenuous use of your arms and shoulders until at least 6 weeks from the time of your surgery, and avoid activities that cause increased pain in your chest on the side of your surgical incision.  Otherwise you may continue to increase activities without any particular limitations.  Increase the intensity and duration of physical activity gradually.   Endocarditis is a potentially serious infection of heart valves or inside lining of the heart.  It occurs more commonly in patients with diseased heart valves (such as patient's with aortic or mitral valve disease) and in patients who have undergone heart valve repair or replacement.  Certain surgical and dental procedures may put you at risk, such as dental cleaning, other dental procedures, or any surgery involving the respiratory, urinary, gastrointestinal tract, gallbladder or prostate gland.   To minimize your chances for develooping endocarditis, maintain good oral health and seek prompt medical attention for any infections involving the mouth, teeth, gums, skin or urinary tract.    Always notify your doctor or dentist about your underlying heart valve condition before having any invasive procedures. You will need to take antibiotics before certain procedures, including all routine dental cleanings or other dental procedures.  Your cardiologist or dentist should prescribe these antibiotics for you to be taken ahead of time.

## 2024-08-16 NOTE — Telephone Encounter (Signed)
 The patient has been notified of the result and verbalized understanding.  All questions (if any) were answered. Littie CHRISTELLA Croak, CMA 08/16/2024 2:19 PM

## 2024-08-20 ENCOUNTER — Ambulatory Visit: Payer: Self-pay | Admitting: Internal Medicine

## 2024-08-20 ENCOUNTER — Other Ambulatory Visit: Payer: Self-pay | Admitting: Physician Assistant

## 2024-08-20 DIAGNOSIS — E1165 Type 2 diabetes mellitus with hyperglycemia: Secondary | ICD-10-CM

## 2024-08-20 NOTE — Telephone Encounter (Unsigned)
 Copied from CRM #[id number]. Topic: Clinical - Medication Refill >> Aug 20, 2024  4:34 PM Roselie C wrote: Medication: metFORMIN  (GLUCOPHAGE -XR) 750 MG 24 hr tablet  Has the patient contacted their pharmacy? No (Agent: If no, request that the patient contact the pharmacy for the refill. If patient does not wish to contact the pharmacy document the reason why and proceed with request.) (Agent: If yes, when and what did the pharmacy advise?)  This is the patient's preferred pharmacy:  [facility] [phone] - [place], [place] - [address] AT [facility] OF [address] [place] [address] Phone: [phone] Fax: [phone]  correct pharmacy ,? Yes If no, delete pharmacy and type the correct one.   Has the prescription been filled recently? Yes  Is the patient out of the medication? No  Has the patient been seen for an appointment in the last year OR does the patient have an upcoming appointment? Yes  Can we respond through MyChart? Yes  Agent: Please be advised that Rx refills may take up to 3 business days. We ask that you follow-up with your pharmacy.

## 2024-08-21 ENCOUNTER — Ambulatory Visit: Attending: Internal Medicine

## 2024-08-21 ENCOUNTER — Telehealth (HOSPITAL_COMMUNITY): Payer: Self-pay

## 2024-08-21 ENCOUNTER — Encounter (HOSPITAL_COMMUNITY)
Admission: RE | Admit: 2024-08-21 | Discharge: 2024-08-21 | Disposition: A | Discharge status: Home / Self Care | Source: Ambulatory Visit | Attending: Internal Medicine | Admitting: Internal Medicine

## 2024-08-21 DIAGNOSIS — Z5181 Encounter for therapeutic drug level monitoring: Secondary | ICD-10-CM

## 2024-08-21 DIAGNOSIS — Z8679 Personal history of other diseases of the circulatory system: Secondary | ICD-10-CM

## 2024-08-21 DIAGNOSIS — Z953 Presence of xenogenic heart valve: Secondary | ICD-10-CM | POA: Insufficient documentation

## 2024-08-21 LAB — POCT INR: INR: 2 (ref 2.0–3.0)

## 2024-08-21 MED ORDER — METFORMIN HCL ER 750 MG PO TB24: 750.0000 mg | ORAL_TABLET | Freq: Every day | ORAL | 0 refills | Status: DC

## 2024-08-21 NOTE — Telephone Encounter (Signed)
 Attempted to call patient regarding his virtual appt today. No answer, left voicemail and reminded of his in person orientation tomorrow at 8:30 am.

## 2024-08-21 NOTE — Progress Notes (Signed)
 Completed virtual orientation today.  EP evaluation is scheduled for 08/22/24 at 0830 .  Documentation for diagnosis can be found in [facility] encounter 07/01/24.

## 2024-08-21 NOTE — Patient Instructions (Signed)
 Continue warfarin 1 tablet daily except 2 tablets on Sundays and Wednesday. Recheck in 2 wk

## 2024-08-21 NOTE — Progress Notes (Signed)
 INR 2.0 Please see anticoagulation encounter.

## 2024-08-22 ENCOUNTER — Encounter (HOSPITAL_COMMUNITY)
Admission: RE | Admit: 2024-08-22 | Discharge: 2024-08-22 | Disposition: A | Source: Ambulatory Visit | Attending: Internal Medicine

## 2024-08-22 VITALS — Ht 74.0 in | Wt 263.5 lb

## 2024-08-22 DIAGNOSIS — Z953 Presence of xenogenic heart valve: Secondary | ICD-10-CM

## 2024-08-22 NOTE — Progress Notes (Signed)
 Cardiac Individual Treatment Plan  Patient Details  Name: Johnathan Richmond MRN: [id number] Date of Birth: 07-01-1961 Referring Provider:   Flowsheet Row CARDIAC REHAB PHASE II ORIENTATION from 08/22/2024 in [facility] CARDIAC REHABILITATION  Referring Provider Stacia Diannah Proffer MD    Initial Encounter Date:  Flowsheet Row CARDIAC REHAB PHASE II ORIENTATION from 08/22/2024 in [place] CARDIAC REHABILITATION  Date 08/22/24    Visit Diagnosis: S/P heart valve replacement with bioprosthetic valve  Patient's Home Medications on Admission: Current Medications[1]  Past Medical History: Past Medical History:  Diagnosis Date   Aortic stenosis    Diabetes mellitus (HCC)    Dyspnea    Essential hypertension    Heart murmur    heart murmur since childhood   Hypothyroidism    Morbid obesity (HCC)    Paroxysmal A-fib (HCC)    Paroxysmal atrial flutter (HCC)     Tobacco Use: Tobacco Use History[2]  Labs: Review Flowsheet  More data exists      Latest Ref Rng & Units 11/17/2021 01/02/2024 04/02/2024 06/28/2024 07/01/2024  Labs for ITP Cardiac and Pulmonary Rehab  Cholestrol 100 - 199 mg/dL - 809  - - -  LDL (calc) 0 - 99 mg/dL - 873  - - -  HDL-C >60 mg/dL - 46  - - -  Trlycerides 0 - 149 mg/dL - 99  - - -  Hemoglobin A1c 4.8 - 5.6 % - 11.8  7.4  6.5  -  PH, Arterial 7.35 - 7.45 - - - - 7.353  7.394  7.420  7.355   PCO2 arterial 32 - 48 mmHg - - - - 38.9  34.0  38.0  45.1   Bicarbonate 20.0 - 28.0 mmol/L 26.0  - - - 21.5  20.7  24.7  24.0  25.2   TCO2 22 - 32 mmol/L - - - - 23  23  22  26  24  25  27  23  24    Acid-base deficit 0.0 - 2.0 mmol/L - - - - 4.0  4.0  2.0  1.0   O2 Saturation % 61.2  - - - 99  100  100  80  100     Details       Multiple values from one day are sorted in reverse-chronological order          Exercise Target Goals: Exercise Program Goal: Individual exercise prescription set using results from initial 6 min walk test and THRR while  considering  patients activity barriers and safety.   Exercise Prescription Goal: Initial exercise prescription builds to 30-45 minutes a day of aerobic activity, 2-3 days per week.  Home exercise guidelines will be given to patient during program as part of exercise prescription that the participant will acknowledge.   Education: Aerobic Exercise: - Group verbal and visual presentation on the components of exercise prescription. Introduces F.I.T.T principle from ACSM for exercise prescriptions.  Reviews F.I.T.T. principles of aerobic exercise including progression. Written material provided at class time.   Education: Resistance Exercise: - Group verbal and visual presentation on the components of exercise prescription. Introduces F.I.T.T principle from ACSM for exercise prescriptions  Reviews F.I.T.T. principles of resistance exercise including progression. Written material provided at class time.    Education: Exercise & Equipment Safety: - Individual verbal instruction and demonstration of equipment use and safety with use of the equipment.   Education: Exercise Physiology & General Exercise Guidelines: - Group verbal and written instruction with models to review the  exercise physiology of the cardiovascular system and associated critical values. Provides general exercise guidelines with specific guidelines to those with heart or lung disease. Written material provided at class time.   Education: Flexibility, Balance, Mind/Body Relaxation: - Group verbal and visual presentation with interactive activity on the components of exercise prescription. Introduces F.I.T.T principle from ACSM for exercise prescriptions. Reviews F.I.T.T. principles of flexibility and balance exercise training including progression. Also discusses the mind body connection.  Reviews various relaxation techniques to help reduce and manage stress (i.e. Deep breathing, progressive muscle relaxation, and visualization).  Balance handout provided to take home. Written material provided at class time.   Activity Barriers & Risk Stratification:  Activity Barriers & Cardiac Risk Stratification - 08/22/24 0940       Activity Barriers & Cardiac Risk Stratification   Activity Barriers Deconditioning;Muscular Weakness;Joint Problems;Balance Concerns   rotator cuff repair   Cardiac Risk Stratification Moderate          6 Minute Walk:  6 Minute Walk     Row Name 08/22/24 0938         6 Minute Walk   Phase Initial     Distance 1080 feet     Walk Time 6 minutes     # of Rest Breaks 0     MPH 2.05     METS 2.45     RPE 9     Perceived Dyspnea  1     VO2 Peak 8.59     Symptoms Yes (comment)     Comments SOB, legs felt weak     Resting HR 68 bpm     Resting BP 124/62     Resting Oxygen Saturation  99 %     Exercise Oxygen Saturation  during 6 min walk 98 %     Max Ex. HR 91 bpm     Max Ex. BP 136/80     2 Minute Post BP 126/70        Oxygen Initial Assessment:   Oxygen Re-Evaluation:   Oxygen Discharge (Final Oxygen Re-Evaluation):   Initial Exercise Prescription:  Initial Exercise Prescription - 08/22/24 0900       Date of Initial Exercise RX and Referring Provider   Date 08/22/24    Referring Provider Mallipeddi, Diannah Proffer MD      Oxygen   Maintain Oxygen Saturation 88% or higher      Treadmill   MPH 1.8    Grade 0.5    Minutes 15    METs 2.5      REL-XR   Level 3    Speed 50    Minutes 15    METs 2.5      Prescription Details   Frequency (times per week) 3    Duration Progress to 30 minutes of continuous aerobic without signs/symptoms of physical distress      Intensity   THRR 40-80% of Max Heartrate 103-138    Ratings of Perceived Exertion 11-13    Perceived Dyspnea 0-4      Progression   Progression Continue to progress workloads to maintain intensity without signs/symptoms of physical distress.      Resistance Training   Training Prescription Yes     Weight 4 lb    Reps 10-15          Perform Capillary Blood Glucose checks as needed.  Exercise Prescription Changes:   Exercise Prescription Changes     Row Name 08/22/24 0900  Response to Exercise   Blood Pressure (Admit) 124/62       Blood Pressure (Exercise) 136/80       Blood Pressure (Exit) 126/70       Heart Rate (Admit) 68 bpm       Heart Rate (Exercise) 91 bpm       Heart Rate (Exit) 72 bpm       Oxygen Saturation (Admit) 99 %       Oxygen Saturation (Exercise) 98 %       Rating of Perceived Exertion (Exercise) 9       Perceived Dyspnea (Exercise) 1       Symptoms SOB, legs felt weak       Comments walk test results          Exercise Comments:   Exercise Comments     Row Name 08/21/24 1358           Exercise Comments Walks inside and outside depending on the weather, he doesn't walk outside if it is less  than 50 degrees.          Exercise Goals and Review:   Exercise Goals     Row Name 08/21/24 1358             Exercise Goals   Increase Physical Activity Yes       Intervention Provide advice, education, support and counseling about physical activity/exercise needs.;Develop an individualized exercise prescription for aerobic and resistive training based on initial evaluation findings, risk stratification, comorbidities and participant's personal goals.       Expected Outcomes Short Term: Attend rehab on a regular basis to increase amount of physical activity.;Long Term: Add in home exercise to make exercise part of routine and to increase amount of physical activity.;Long Term: Exercising regularly at least 3-5 days a week.       Increase Strength and Stamina Yes       Intervention Provide advice, education, support and counseling about physical activity/exercise needs.;Develop an individualized exercise prescription for aerobic and resistive training based on initial evaluation findings, risk stratification, comorbidities and  participant's personal goals.       Expected Outcomes Short Term: Increase workloads from initial exercise prescription for resistance, speed, and METs.;Short Term: Perform resistance training exercises routinely during rehab and add in resistance training at home;Long Term: Improve cardiorespiratory fitness, muscular endurance and strength as measured by increased METs and functional capacity ( )       Able to understand and use rate of perceived exertion (RPE) scale Yes       Intervention Provide education and explanation on how to use RPE scale       Expected Outcomes Short Term: Able to use RPE daily in rehab to express subjective intensity level;Long Term:  Able to use RPE to guide intensity level when exercising independently       Able to understand and use Dyspnea scale Yes       Intervention Provide education and explanation on how to use Dyspnea scale       Expected Outcomes Long Term: Able to use Dyspnea scale to guide intensity level when exercising independently;Short Term: Able to use Dyspnea scale daily in rehab to express subjective sense of shortness of breath during exertion       Knowledge and understanding of Target Heart Rate Range (THRR) Yes       Intervention Provide education and explanation of THRR including how the numbers were predicted and where they are located for  reference       Expected Outcomes Short Term: Able to state/look up THRR;Short Term: Able to use daily as guideline for intensity in rehab;Long Term: Able to use THRR to govern intensity when exercising independently       Able to check pulse independently Yes       Intervention Review the importance of being able to check your own pulse for safety during independent exercise;Provide education and demonstration on how to check pulse in carotid and radial arteries.       Expected Outcomes Long Term: Able to check pulse independently and accurately;Short Term: Able to explain why pulse checking is important  during independent exercise       Understanding of Exercise Prescription Yes       Intervention Provide education, explanation, and written materials on patient's individual exercise prescription       Expected Outcomes Long Term: Able to explain home exercise prescription to exercise independently;Short Term: Able to explain program exercise prescription          Exercise Goals Re-Evaluation :   Discharge Exercise Prescription (Final Exercise Prescription Changes):  Exercise Prescription Changes - 08/22/24 0900       Response to Exercise   Blood Pressure (Admit) 124/62    Blood Pressure (Exercise) 136/80    Blood Pressure (Exit) 126/70    Heart Rate (Admit) 68 bpm    Heart Rate (Exercise) 91 bpm    Heart Rate (Exit) 72 bpm    Oxygen Saturation (Admit) 99 %    Oxygen Saturation (Exercise) 98 %    Rating of Perceived Exertion (Exercise) 9    Perceived Dyspnea (Exercise) 1    Symptoms SOB, legs felt weak    Comments walk test results          Nutrition:  Target Goals: Understanding of nutrition guidelines, daily intake of sodium 1500mg , cholesterol 200mg , calories 30% from fat and 7% or less from saturated fats, daily to have 5 or more servings of fruits and vegetables.  Education: Nutrition 1 -Group instruction provided by verbal, written material, interactive activities, discussions, models, and posters to present general guidelines for heart healthy nutrition including macronutrients, label reading, and promoting whole foods over processed counterparts. Education serves as pensions consultant of discussion of heart healthy eating for all. Written material provided at class time.    Education: Nutrition 2 -Group instruction provided by verbal, written material, interactive activities, discussions, models, and posters to present general guidelines for heart healthy nutrition including sodium, cholesterol, and saturated fat. Providing guidance of habit forming to improve blood  pressure, cholesterol, and body weight. Written material provided at class time.     Biometrics:  Pre Biometrics - 08/22/24 0942       Pre Biometrics   Height 6' 2 (1.88 m)    Weight 119.5 kg    Waist Circumference 45 inches    Hip Circumference 44.5 inches    Waist to Hip Ratio 1.01 %    BMI (Calculated) 33.82    Grip Strength 24.3 kg    Single Leg Stand 15.2 seconds           Nutrition Therapy Plan and Nutrition Goals:  Nutrition Therapy & Goals - 08/21/24 1403       Intervention Plan   Intervention Prescribe, educate and counsel regarding individualized specific dietary modifications aiming towards targeted core components such as weight, hypertension, lipid management, diabetes, heart failure and other comorbidities.;Nutrition handout(s) given to patient.    Expected Outcomes Short  Term Goal: A plan has been developed with personal nutrition goals set during dietitian appointment.;Long Term Goal: Adherence to prescribed nutrition plan.;Short Term Goal: Understand basic principles of dietary content, such as calories, fat, sodium, cholesterol and nutrients.          Nutrition Assessments:  MEDIFICTS Score Key: >=70 Need to make dietary changes  40-70 Heart Healthy Diet <= 40 Therapeutic Level Cholesterol Diet  Flowsheet Row CARDIAC REHAB PHASE II ORIENTATION from 08/22/2024 in [facility] CARDIAC REHABILITATION  Picture Your Plate Total Score on Admission 64   Picture Your Plate Scores: <59 Unhealthy dietary pattern with much room for improvement. 41-50 Dietary pattern unlikely to meet recommendations for good health and room for improvement. 51-60 More healthful dietary pattern, with some room for improvement.  >60 Healthy dietary pattern, although there may be some specific behaviors that could be improved.    Nutrition Goals Re-Evaluation:   Nutrition Goals Discharge (Final Nutrition Goals Re-Evaluation):   Psychosocial: Target Goals: Acknowledge  presence or absence of significant depression and/or stress, maximize coping skills, provide positive support system. Participant is able to verbalize types and ability to use techniques and skills needed for reducing stress and depression.   Education: Stress, Anxiety, and Depression - Group verbal and visual presentation to define topics covered.  Reviews how body is impacted by stress, anxiety, and depression.  Also discusses healthy ways to reduce stress and to treat/manage anxiety and depression. Written material provided at class time.   Education: Sleep Hygiene -Provides group verbal and written instruction about how sleep can affect your health.  Define sleep hygiene, discuss sleep cycles and impact of sleep habits. Review good sleep hygiene tips.   Initial Review & Psychosocial Screening:  Initial Psych Review & Screening - 08/21/24 1403       Initial Review   Current issues with None Identified      Family Dynamics   Good Support System? Yes    Comments Lives with two brothers who are his support.      Barriers   Psychosocial barriers to participate in program There are no identifiable barriers or psychosocial needs.      Screening Interventions   Interventions Encouraged to exercise    Expected Outcomes Short Term goal: Utilizing psychosocial counselor, staff and physician to assist with identification of specific Stressors or current issues interfering with healing process. Setting desired goal for each stressor or current issue identified.;Long Term Goal: Stressors or current issues are controlled or eliminated.;Long Term goal: The participant improves quality of Life and PHQ9 Scores as seen by post scores and/or verbalization of changes;Short Term goal: Identification and review with participant of any Quality of Life or Depression concerns found by scoring the questionnaire.          Quality of Life Scores:   Scores of 19 and below usually indicate a poorer quality of  life in these areas.  A difference of  2-3 points is a clinically meaningful difference.  A difference of 2-3 points in the total score of the Quality of Life Index has been associated with significant improvement in overall quality of life, self-image, physical symptoms, and general health in studies assessing change in quality of life.  PHQ-9: Review Flowsheet       08/22/2024 04/02/2024 01/02/2024  Depression screen PHQ 2/9  Decreased Interest 0 0 2  Down, Depressed, Hopeless 1 0 0  PHQ - 2 Score 1 0 2  Altered sleeping 2 - 3  Tired, decreased energy  0 - 3  Change in appetite 0 - 3  Feeling bad or failure about yourself  0 - 0  Trouble concentrating 1 - 1  Moving slowly or fidgety/restless 1 - 0  Suicidal thoughts 0 - 0  PHQ-9 Score 5 - 12   Difficult doing work/chores Somewhat difficult - Somewhat difficult    Details       Data saved with a previous flowsheet row definition        Interpretation of Total Score  Total Score Depression Severity:  1-4 = Minimal depression, 5-9 = Mild depression, 10-14 = Moderate depression, 15-19 = Moderately severe depression, 20-27 = Severe depression   Psychosocial Evaluation and Intervention:  Psychosocial Evaluation - 08/21/24 1404       Psychosocial Evaluation & Interventions   Interventions Encouraged to exercise with the program and follow exercise prescription    Comments Armany is coming into rehab for S/P aortic valve replacement. 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 He is hopeful to go back part time after a little while being recovered. He does walk some at home, inside unless it is above 50 degrees outside. He lives in an apartment with his two brothers who are his support system. He is diabetic and takes Metformin  and Lantus  for it. His mobility is fine and doesn't report any trouble with anything besides he had asthma when he was younger.    Continue Psychosocial Services   Follow up required by staff          Psychosocial Re-Evaluation:   Psychosocial Discharge (Final Psychosocial Re-Evaluation):   Vocational Rehabilitation: Provide vocational rehab assistance to qualifying candidates.   Vocational Rehab Evaluation & Intervention:  Vocational Rehab - 08/21/24 1402       Initial Vocational Rehab Evaluation & Intervention   Assessment shows need for Vocational Rehabilitation No          Education: Education Goals: Education classes will be provided on a variety of topics geared toward better understanding of heart health and risk factor modification. Participant will state understanding/return demonstration of topics presented as noted by education test scores.  Learning Barriers/Preferences:  Learning Barriers/Preferences - 08/21/24 1403       Learning Barriers/Preferences   Learning Barriers None    Learning Preferences None          General Cardiac Education Topics:  AED/CPR: - Group verbal and written instruction with the use of models to demonstrate the basic use of the AED with the basic ABC's of resuscitation.   Test and Procedures: - Group verbal and visual presentation and models provide information about basic cardiac anatomy and function. Reviews the testing methods done to diagnose heart disease and the outcomes of the test results. Describes the treatment choices: Medical Management, Angioplasty, or Coronary Bypass Surgery for treating various heart conditions including Myocardial Infarction, Angina, Valve Disease, and Cardiac Arrhythmias. Written material provided at class time.   Medication Safety: - Group verbal and visual instruction to review commonly prescribed medications for heart and lung disease. Reviews the medication, class of the drug, and side effects. Includes the steps to properly store meds and maintain the prescription regimen. Written material provided at class time.   Intimacy: - Group verbal  instruction through game format to discuss how heart and lung disease can affect sexual intimacy. Written material provided at class time.   Know Your Numbers and Heart Failure: - Group verbal and visual instruction to discuss disease risk factors  for cardiac and pulmonary disease and treatment options.  Reviews associated critical values for Overweight/Obesity, Hypertension, Cholesterol, and Diabetes.  Discusses basics of heart failure: signs/symptoms and treatments.  Introduces Heart Failure Zone chart for action plan for heart failure. Written material provided at class time.   Infection Prevention: - Provides verbal and written material to individual with discussion of infection control including proper hand washing and proper equipment cleaning during exercise session.   Falls Prevention: - Provides verbal and written material to individual with discussion of falls prevention and safety.   Other: -Provides group and verbal instruction on various topics (see comments)   Knowledge Questionnaire Score:   Core Components/Risk Factors/Patient Goals at Admission:  Personal Goals and Risk Factors at Admission - 08/22/24 0943       Core Components/Risk Factors/Patient Goals on Admission    Weight Management Yes;Obesity;Weight Loss    Intervention Weight Management: Develop a combined nutrition and exercise program designed to reach desired caloric intake, while maintaining appropriate intake of nutrient and fiber, sodium and fats, and appropriate energy expenditure required for the weight goal.;Weight Management: Provide education and appropriate resources to help participant work on and attain dietary goals.;Weight Management/Obesity: Establish reasonable short term and long term weight goals.;Obesity: Provide education and appropriate resources to help participant work on and attain dietary goals.    Admit Weight 263 lb 8 oz (119.5 kg)    Goal Weight: Short Term 258 lb (117 kg)    Goal  Weight: Long Term 253 lb (114.8 kg)    Expected Outcomes Short Term: Continue to assess and modify interventions until short term weight is achieved;Long Term: Adherence to nutrition and physical activity/exercise program aimed toward attainment of established weight goal;Weight Loss: Understanding of general recommendations for a balanced deficit meal plan, which promotes 1-2 lb weight loss per week and includes a negative energy balance of [id number] kcal/d;Understanding recommendations for meals to include 15-35% energy as protein, 25-35% energy from fat, 35-60% energy from carbohydrates, less than 200mg  of dietary cholesterol, 20-35 gm of total fiber daily;Understanding of distribution of calorie intake throughout the day with the consumption of 4-5 meals/snacks    Diabetes Yes    Intervention Provide education about signs/symptoms and action to take for hypo/hyperglycemia.;Provide education about proper nutrition, including hydration, and aerobic/resistive exercise prescription along with prescribed medications to achieve blood glucose in normal ranges: Fasting glucose 65-99 mg/dL    Expected Outcomes Short Term: Participant verbalizes understanding of the signs/symptoms and immediate care of hyper/hypoglycemia, proper foot care and importance of medication, aerobic/resistive exercise and nutrition plan for blood glucose control.;Long Term: Attainment of HbA1C < 7%.    Hypertension Yes    Intervention Monitor prescription use compliance.;Provide education on lifestyle modifcations including regular physical activity/exercise, weight management, moderate sodium restriction and increased consumption of fresh fruit, vegetables, and low fat dairy, alcohol moderation, and smoking cessation.    Expected Outcomes Long Term: Maintenance of blood pressure at goal levels.;Short Term: Continued assessment and intervention until BP is < 140/60mm HG in hypertensive participants. < 130/97mm HG in hypertensive  participants with diabetes, heart failure or chronic kidney disease.          Education:Diabetes - Individual verbal and written instruction to review signs/symptoms of diabetes, desired ranges of glucose level fasting, after meals and with exercise. Acknowledge that pre and post exercise glucose checks will be done for 3 sessions at entry of program.   Core Components/Risk Factors/Patient Goals Review:    Core Components/Risk Factors/Patient Goals at  Discharge (Final Review):    ITP Comments:  ITP Comments     Row Name 08/21/24 1409 08/22/24 0937         ITP Comments Completed virtual orientation today.  EP evaluation is scheduled for 08/22/24 at 0830 .  Documentation for diagnosis can be found in [facility] encounter 07/01/24. Patient attend orientation today.  Patient is attending Cardiac Rehabilitation Program.  Documentation for diagnosis can be found in [facility] 07/01/24.  Reviewed medical chart, RPE/RPD, gym safety, and program guidelines.  Patient was fitted to equipment they will be using during rehab.  Patient is scheduled to start exercise on Monday 08/26/24 at 915.   Initial ITP created and sent for review and signature by Dr. Dorn Ross, Medical Director for Card[facility] Rehabilitation Program.         Comments: Initial ITP    [1]  Current Outpatient Medications:    acetaminophen  (TYLENOL ) 500 MG tablet, Take 1-2 tablets (500-1,000 mg total) by mouth every 6 (six) hours as needed., Disp: 30 tablet, Rfl: 0   amiodarone  (PACERONE ) 200 MG tablet, Take 1 tablet (200 mg total) by mouth daily., Disp: 38 tablet, Rfl: 0   insulin  glargine (LANTUS  SOLOSTAR) 100 UNIT/ML Solostar Pen, Inject 20 Units into the skin daily., Disp: 15 mL, Rfl: 0   Insulin  Pen Needle (PEN NEEDLES) 31G X 5 MM MISC, 1 each by Does not apply route daily. May substitute to any manufacturer covered by patient's insurance., Disp: 100 each, Rfl: 0   metFORMIN  (GLUCOPHAGE -XR) 750 MG 24 hr tablet, Take 1 tablet (750 mg  total) by mouth daily with breakfast., Disp: 90 tablet, Rfl: 0   metoprolol  succinate (TOPROL  XL) 25 MG 24 hr tablet, Take 1 tablet (25 mg total) by mouth daily., Disp: 90 tablet, Rfl: 3   OVER THE COUNTER MEDICATION, Take 1 tablet by mouth daily. Boost TRT Gummy, Disp: , Rfl:    warfarin (COUMADIN ) 3 MG tablet, Take 1 to 2 tablets daily as directed by coumadin  clinic, Disp: 60 tablet, Rfl: 3 [2]  Social History Tobacco Use  Smoking Status Never  Smokeless Tobacco Never

## 2024-08-22 NOTE — Patient Instructions (Signed)
 Patient Instructions  Patient Details  Name: Johnathan Richmond MRN: [id number] Date of Birth: [birth year] Referring Provider:  Mallipeddi, Vishnu P, MD  Below are your personal goals for exercise, nutrition, and risk factors. Our goal is to help you stay on track towards obtaining and maintaining these goals. We will be discussing your progress on these goals with you throughout the program.  Initial Exercise Prescription:  Initial Exercise Prescription - 08/22/24 0900       Date of Initial Exercise RX and Referring Provider   Date 08/22/24    Referring Provider Mallipeddi, Diannah Proffer MD      Oxygen   Maintain Oxygen Saturation 88% or higher      Treadmill   MPH 1.8    Grade 0.5    Minutes 15    METs 2.5      REL-XR   Level 3    Speed 50    Minutes 15    METs 2.5      Prescription Details   Frequency (times per week) 3    Duration Progress to 30 minutes of continuous aerobic without signs/symptoms of physical distress      Intensity   THRR 40-80% of Max Heartrate 103-138    Ratings of Perceived Exertion 11-13    Perceived Dyspnea 0-4      Progression   Progression Continue to progress workloads to maintain intensity without signs/symptoms of physical distress.      Resistance Training   Training Prescription Yes    Weight 4 lb    Reps 10-15          Exercise Goals: Frequency: Be able to perform aerobic exercise two to three times per week in program working toward 2-5 days per week of home exercise.  Intensity: Work with a perceived exertion of 11 (fairly light) - 15 (hard) while following your exercise prescription.  We will make changes to your prescription with you as you progress through the program.   Duration: Be able to do 30 to 45 minutes of continuous aerobic exercise in addition to a 5 minute warm-up and a 5 minute cool-down routine.   Nutrition Goals: Your personal nutrition goals will be established when you do your nutrition analysis with the  dietician.  The following are general nutrition guidelines to follow: Cholesterol < 200mg /day Sodium < 1500mg /day Fiber: Men over 50 yrs - 30 grams per day  Personal Goals:  Personal Goals and Risk Factors at Admission - 08/22/24 0943       Core Components/Risk Factors/Patient Goals on Admission    Weight Management Yes;Obesity;Weight Loss    Intervention Weight Management: Develop a combined nutrition and exercise program designed to reach desired caloric intake, while maintaining appropriate intake of nutrient and fiber, sodium and fats, and appropriate energy expenditure required for the weight goal.;Weight Management: Provide education and appropriate resources to help participant work on and attain dietary goals.;Weight Management/Obesity: Establish reasonable short term and long term weight goals.;Obesity: Provide education and appropriate resources to help participant work on and attain dietary goals.    Admit Weight 263 lb 8 oz (119.5 kg)    Goal Weight: Short Term 258 lb (117 kg)    Goal Weight: Long Term 253 lb (114.8 kg)    Expected Outcomes Short Term: Continue to assess and modify interventions until short term weight is achieved;Long Term: Adherence to nutrition and physical activity/exercise program aimed toward attainment of established weight goal;Weight Loss: Understanding of general recommendations for a balanced deficit meal  plan, which promotes 1-2 lb weight loss per week and includes a negative energy balance of [phone] kcal/d;Understanding recommendations for meals to include 15-35% energy as protein, 25-35% energy from fat, 35-60% energy from carbohydrates, less than 200mg  of dietary cholesterol, 20-35 gm of total fiber daily;Understanding of distribution of calorie intake throughout the day with the consumption of 4-5 meals/snacks    Diabetes Yes    Intervention Provide education about signs/symptoms and action to take for hypo/hyperglycemia.;Provide education about  proper nutrition, including hydration, and aerobic/resistive exercise prescription along with prescribed medications to achieve blood glucose in normal ranges: Fasting glucose 65-99 mg/dL    Expected Outcomes Short Term: Participant verbalizes understanding of the signs/symptoms and immediate care of hyper/hypoglycemia, proper foot care and importance of medication, aerobic/resistive exercise and nutrition plan for blood glucose control.;Long Term: Attainment of HbA1C < 7%.    Hypertension Yes    Intervention Monitor prescription use compliance.;Provide education on lifestyle modifcations including regular physical activity/exercise, weight management, moderate sodium restriction and increased consumption of fresh fruit, vegetables, and low fat dairy, alcohol moderation, and smoking cessation.    Expected Outcomes Long Term: Maintenance of blood pressure at goal levels.;Short Term: Continued assessment and intervention until BP is < 140/44mm HG in hypertensive participants. < 130/42mm HG in hypertensive participants with diabetes, heart failure or chronic kidney disease.          Tobacco Use Initial Evaluation: Social History   Tobacco Use  Smoking Status Never  Smokeless Tobacco Never    Exercise Goals and Review:  Exercise Goals     Row Name 08/21/24 1358             Exercise Goals   Increase Physical Activity Yes       Intervention Provide advice, education, support and counseling about physical activity/exercise needs.;Develop an individualized exercise prescription for aerobic and resistive training based on initial evaluation findings, risk stratification, comorbidities and participant's personal goals.       Expected Outcomes Short Term: Attend rehab on a regular basis to increase amount of physical activity.;Long Term: Add in home exercise to make exercise part of routine and to increase amount of physical activity.;Long Term: Exercising regularly at least 3-5 days a week.        Increase Strength and Stamina Yes       Intervention Provide advice, education, support and counseling about physical activity/exercise needs.;Develop an individualized exercise prescription for aerobic and resistive training based on initial evaluation findings, risk stratification, comorbidities and participant's personal goals.       Expected Outcomes Short Term: Increase workloads from initial exercise prescription for resistance, speed, and METs.;Short Term: Perform resistance training exercises routinely during rehab and add in resistance training at home;Long Term: Improve cardiorespiratory fitness, muscular endurance and strength as measured by increased METs and functional capacity ( )       Able to understand and use rate of perceived exertion (RPE) scale Yes       Intervention Provide education and explanation on how to use RPE scale       Expected Outcomes Short Term: Able to use RPE daily in rehab to express subjective intensity level;Long Term:  Able to use RPE to guide intensity level when exercising independently       Able to understand and use Dyspnea scale Yes       Intervention Provide education and explanation on how to use Dyspnea scale       Expected Outcomes Long Term: Able to use Dyspnea  scale to guide intensity level when exercising independently;Short Term: Able to use Dyspnea scale daily in rehab to express subjective sense of shortness of breath during exertion       Knowledge and understanding of Target Heart Rate Range (THRR) Yes       Intervention Provide education and explanation of THRR including how the numbers were predicted and where they are located for reference       Expected Outcomes Short Term: Able to state/look up THRR;Short Term: Able to use daily as guideline for intensity in rehab;Long Term: Able to use THRR to govern intensity when exercising independently       Able to check pulse independently Yes       Intervention Review the importance of being  able to check your own pulse for safety during independent exercise;Provide education and demonstration on how to check pulse in carotid and radial arteries.       Expected Outcomes Long Term: Able to check pulse independently and accurately;Short Term: Able to explain why pulse checking is important during independent exercise       Understanding of Exercise Prescription Yes       Intervention Provide education, explanation, and written materials on patient's individual exercise prescription       Expected Outcomes Long Term: Able to explain home exercise prescription to exercise independently;Short Term: Able to explain program exercise prescription        Copy of goals given to participant.

## 2024-08-26 ENCOUNTER — Encounter (HOSPITAL_COMMUNITY)

## 2024-08-26 DIAGNOSIS — Z953 Presence of xenogenic heart valve: Secondary | ICD-10-CM | POA: Diagnosis not present

## 2024-08-26 LAB — GLUCOSE, CAPILLARY
Glucose-Capillary: 173 mg/dL — ABNORMAL HIGH (ref 70–99)
Glucose-Capillary: 138 mg/dL — ABNORMAL HIGH (ref 70–99)

## 2024-08-26 NOTE — Progress Notes (Signed)
 Daily Session Note  Patient Details  Name: Johnathan Richmond MRN: [id number] Date of Birth: 08-30-1961 Referring Provider:   Flowsheet Row CARDIAC REHAB PHASE II ORIENTATION from 08/22/2024 in [facility] CARDIAC REHABILITATION  Referring Provider Stacia Diannah Proffer MD    Encounter Date: 08/26/2024  Check In:  Session Check In - 08/26/24 0800       Check-In   Supervising physician immediately available to respond to emergencies See telemetry face sheet for immediately available MD    Location [facility]    Staff Present Laymon Rattler, BSN, RN, Rosalba Gelineau, MA, RCEP, CCRP, CCET;Heather Lindsey, [place], Exercise Physiologist    Virtual Visit No    Medication changes reported     No    Fall or balance concerns reported    No    Tobacco Cessation No Change    Warm-up and Cool-down Performed on first and last piece of equipment    Resistance Training Performed Yes    VAD Patient? No    PAD/SET Patient? No      Pain Assessment   Currently in Pain? No/denies          Capillary Blood Glucose: No results found for this or any previous visit (from the past 24 hours).    Tobacco Use History[1]  Goals Met:  Independence with exercise equipment Exercise tolerated well No report of concerns or symptoms today Strength training completed today  Goals Unmet:  Not Applicable  Comments: First full day of exercise!  Patient was oriented to gym and equipment including functions, settings, policies, and procedures.  Patient's individual exercise prescription and treatment plan were reviewed.  All starting workloads were established based on the results of the 6 minute walk test done at initial orientation visit.  The plan for exercise progression was also introduced and progression will be customized based on patient's performance and goals.        [1]  Social History Tobacco Use  Smoking Status Never  Smokeless Tobacco Never

## 2024-08-28 ENCOUNTER — Encounter (HOSPITAL_COMMUNITY)
Admission: RE | Admit: 2024-08-28 | Discharge: 2024-08-28 | Disposition: A | Source: Ambulatory Visit | Attending: Internal Medicine

## 2024-08-28 DIAGNOSIS — Z953 Presence of xenogenic heart valve: Secondary | ICD-10-CM | POA: Diagnosis not present

## 2024-08-28 LAB — GLUCOSE, CAPILLARY: Glucose-Capillary: 135 mg/dL — ABNORMAL HIGH (ref 70–99)

## 2024-08-28 NOTE — Progress Notes (Signed)
 Daily Session Note  Patient Details  Name: Johnathan Richmond MRN: [id number] Date of Birth: 1961/05/16 Referring Provider:   Flowsheet Row CARDIAC REHAB PHASE II ORIENTATION from 08/22/2024 in [facility] CARDIAC REHABILITATION  Referring Provider Stacia Diannah Proffer MD    Encounter Date: 08/28/2024  Check In:  Session Check In - 08/28/24 0929       Check-In   Supervising physician immediately available to respond to emergencies See telemetry face sheet for immediately available MD    Location [facility]    Staff Present Powell Benders, BS, Exercise Physiologist;Jessica Vonzell, MA, RCEP, CCRP, CCET    Virtual Visit No    Medication changes reported     No    Fall or balance concerns reported    No    Tobacco Cessation No Change    Warm-up and Cool-down Performed on first and last piece of equipment    Resistance Training Performed Yes    VAD Patient? No    PAD/SET Patient? No      Pain Assessment   Currently in Pain? No/denies    Multiple Pain Sites No          Capillary Blood Glucose: No results found for this or any previous visit (from the past 24 hours).    Tobacco Use History[1]  Goals Met:  Independence with exercise equipment Exercise tolerated well No report of concerns or symptoms today Strength training completed today  Goals Unmet:  Not Applicable  Comments: Pt able to follow exercise prescription today without complaint.  Will continue to monitor for progression.        [1]  Social History Tobacco Use  Smoking Status Never  Smokeless Tobacco Never

## 2024-09-02 ENCOUNTER — Encounter (HOSPITAL_COMMUNITY)
Admission: RE | Admit: 2024-09-02 | Discharge: 2024-09-02 | Disposition: A | Discharge status: Home / Self Care | Source: Ambulatory Visit | Attending: Internal Medicine | Admitting: Internal Medicine

## 2024-09-02 DIAGNOSIS — Z953 Presence of xenogenic heart valve: Secondary | ICD-10-CM

## 2024-09-02 LAB — GLUCOSE, CAPILLARY: Glucose-Capillary: 125 mg/dL — ABNORMAL HIGH (ref 70–99)

## 2024-09-02 NOTE — Progress Notes (Signed)
 Daily Session Note  Patient Details  Name: Johnathan Richmond MRN: [id number] Date of Birth: Aug 21, 1961 Referring Provider:   Flowsheet Row CARDIAC REHAB PHASE II ORIENTATION from 08/22/2024 in [facility] CARDIAC REHABILITATION  Referring Provider Stacia Diannah Proffer MD    Encounter Date: 09/02/2024  Check In:  Session Check In - 09/02/24 0918       Check-In   Supervising physician immediately available to respond to emergencies See telemetry face sheet for immediately available MD    Location [facility]    Staff Present Harlene Gelineau, MA, RCEP, CCRP, CCET;Danell Verno Jackquline, BSN, RN, WTA-C    Virtual Visit No    Medication changes reported     No    Fall or balance concerns reported    No    Tobacco Cessation No Change    Warm-up and Cool-down Performed on first and last piece of equipment    Resistance Training Performed Yes    VAD Patient? No    PAD/SET Patient? No      Pain Assessment   Currently in Pain? No/denies          Capillary Blood Glucose: No results found for this or any previous visit (from the past 24 hours).    Tobacco Use History[1]  Goals Met:  Independence with exercise equipment Exercise tolerated well No report of concerns or symptoms today Strength training completed today  Goals Unmet:  Not Applicable  Comments: Pt able to follow exercise prescription today without complaint.  Will continue to monitor for progression.        [1]  Social History Tobacco Use  Smoking Status Never  Smokeless Tobacco Never

## 2024-09-03 ENCOUNTER — Encounter (HOSPITAL_COMMUNITY): Payer: Self-pay | Admitting: *Deleted

## 2024-09-03 DIAGNOSIS — Z953 Presence of xenogenic heart valve: Secondary | ICD-10-CM

## 2024-09-03 NOTE — Progress Notes (Signed)
 Cardiac Individual Treatment Plan  Patient Details  Name: Johnathan Richmond MRN: [id number] Date of Birth: 1961-03-16 Referring Provider:   Flowsheet Row CARDIAC REHAB PHASE II ORIENTATION from 08/22/2024 in [facility] CARDIAC REHABILITATION  Referring Provider Stacia Diannah Proffer MD    Initial Encounter Date:  Flowsheet Row CARDIAC REHAB PHASE II ORIENTATION from 08/22/2024 in [facility] [place] CARDIAC REHABILITATION  Date 08/22/24    Visit Diagnosis: S/P heart valve replacement with bioprosthetic valve  Patient's Home Medications on Admission: Current Medications[1]  Past Medical History: Past Medical History:  Diagnosis Date   Aortic stenosis    Diabetes mellitus (HCC)    Dyspnea    Essential hypertension    Heart murmur    heart murmur since childhood   Hypothyroidism    Morbid obesity (HCC)    Paroxysmal A-fib (HCC)    Paroxysmal atrial flutter (HCC)     Tobacco Use: Tobacco Use History[2]  Labs: Review Flowsheet  More data exists      Latest Ref Rng & Units 11/17/2021 01/02/2024 04/02/2024 06/28/2024 07/01/2024  Labs for ITP Cardiac and Pulmonary Rehab  Cholestrol 100 - 199 mg/dL - 809  - - -  LDL (calc) 0 - 99 mg/dL - 873  - - -  HDL-C >60 mg/dL - 46  - - -  Trlycerides 0 - 149 mg/dL - 99  - - -  Hemoglobin A1c 4.8 - 5.6 % - 11.8  7.4  6.5  -  PH, Arterial 7.35 - 7.45 - - - - 7.353  7.394  7.420  7.355   PCO2 arterial 32 - 48 mmHg - - - - 38.9  34.0  38.0  45.1   Bicarbonate 20.0 - 28.0 mmol/L 26.0  - - - 21.5  20.7  24.7  24.0  25.2   TCO2 22 - 32 mmol/L - - - - 23  23  22  26  24  25  27  23  24    Acid-base deficit 0.0 - 2.0 mmol/L - - - - 4.0  4.0  2.0  1.0   O2 Saturation % 61.2  - - - 99  100  100  80  100     Details       Multiple values from one day are sorted in reverse-chronological order         Capillary Blood Glucose: Lab Results  Component Value Date   GLUCAP 125 (H) 09/02/2024   GLUCAP 135 (H) 08/28/2024   GLUCAP 138 (H) 08/26/2024    GLUCAP 173 (H) 08/26/2024   GLUCAP 122 (H) 07/06/2024     Exercise Target Goals: Exercise Program Goal: Individual exercise prescription set using results from initial 6 min walk test and THRR while considering  patients activity barriers and safety.   Exercise Prescription Goal: Starting with aerobic activity 30 plus minutes a day, 3 days per week for initial exercise prescription. Provide home exercise prescription and guidelines that participant acknowledges understanding prior to discharge.  Activity Barriers & Risk Stratification:  Activity Barriers & Cardiac Risk Stratification - 08/22/24 0940       Activity Barriers & Cardiac Risk Stratification   Activity Barriers Deconditioning;Muscular Weakness;Joint Problems;Balance Concerns   rotator cuff repair   Cardiac Risk Stratification Moderate          6 Minute Walk:  6 Minute Walk     Row Name 08/22/24 0938         6 Minute Walk   Phase Initial  Distance 1080 feet     Walk Time 6 minutes     # of Rest Breaks 0     MPH 2.05     METS 2.45     RPE 9     Perceived Dyspnea  1     VO2 Peak 8.59     Symptoms Yes (comment)     Comments SOB, legs felt weak     Resting HR 68 bpm     Resting BP 124/62     Resting Oxygen Saturation  99 %     Exercise Oxygen Saturation  during 6 min walk 98 %     Max Ex. HR 91 bpm     Max Ex. BP 136/80     2 Minute Post BP 126/70        Oxygen Initial Assessment:   Oxygen Re-Evaluation:   Oxygen Discharge (Final Oxygen Re-Evaluation):   Initial Exercise Prescription:  Initial Exercise Prescription - 08/22/24 0900       Date of Initial Exercise RX and Referring Provider   Date 08/22/24    Referring Provider Mallipeddi, Diannah Proffer MD      Oxygen   Maintain Oxygen Saturation 88% or higher      Treadmill   MPH 1.8    Grade 0.5    Minutes 15    METs 2.5      REL-XR   Level 3    Speed 50    Minutes 15    METs 2.5      Prescription Details   Frequency  (times per week) 3    Duration Progress to 30 minutes of continuous aerobic without signs/symptoms of physical distress      Intensity   THRR 40-80% of Max Heartrate 103-138    Ratings of Perceived Exertion 11-13    Perceived Dyspnea 0-4      Progression   Progression Continue to progress workloads to maintain intensity without signs/symptoms of physical distress.      Resistance Training   Training Prescription Yes    Weight 4 lb    Reps 10-15          Perform Capillary Blood Glucose checks as needed.  Exercise Prescription Changes:   Exercise Prescription Changes     Row Name 08/22/24 0900             Response to Exercise   Blood Pressure (Admit) 124/62       Blood Pressure (Exercise) 136/80       Blood Pressure (Exit) 126/70       Heart Rate (Admit) 68 bpm       Heart Rate (Exercise) 91 bpm       Heart Rate (Exit) 72 bpm       Oxygen Saturation (Admit) 99 %       Oxygen Saturation (Exercise) 98 %       Rating of Perceived Exertion (Exercise) 9       Perceived Dyspnea (Exercise) 1       Symptoms SOB, legs felt weak       Comments walk test results          Exercise Comments:   Exercise Comments     Row Name 08/21/24 1358 08/26/24 0801         Exercise Comments Walks inside and outside depending on the weather, he doesn't walk outside if it is less  than 50 degrees. First full day of exercise!  Patient was oriented to gym and  equipment including functions, settings, policies, and procedures.  Patient's individual exercise prescription and treatment plan were reviewed.  All starting workloads were established based on the results of the 6 minute walk test done at initial orientation visit.  The plan for exercise progression was also introduced and progression will be customized based on patient's performance and goals.         Exercise Goals and Review:   Exercise Goals     Row Name 08/21/24 1358             Exercise Goals   Increase Physical  Activity Yes       Intervention Provide advice, education, support and counseling about physical activity/exercise needs.;Develop an individualized exercise prescription for aerobic and resistive training based on initial evaluation findings, risk stratification, comorbidities and participant's personal goals.       Expected Outcomes Short Term: Attend rehab on a regular basis to increase amount of physical activity.;Long Term: Add in home exercise to make exercise part of routine and to increase amount of physical activity.;Long Term: Exercising regularly at least 3-5 days a week.       Increase Strength and Stamina Yes       Intervention Provide advice, education, support and counseling about physical activity/exercise needs.;Develop an individualized exercise prescription for aerobic and resistive training based on initial evaluation findings, risk stratification, comorbidities and participant's personal goals.       Expected Outcomes Short Term: Increase workloads from initial exercise prescription for resistance, speed, and METs.;Short Term: Perform resistance training exercises routinely during rehab and add in resistance training at home;Long Term: Improve cardiorespiratory fitness, muscular endurance and strength as measured by increased METs and functional capacity ( )       Able to understand and use rate of perceived exertion (RPE) scale Yes       Intervention Provide education and explanation on how to use RPE scale       Expected Outcomes Short Term: Able to use RPE daily in rehab to express subjective intensity level;Long Term:  Able to use RPE to guide intensity level when exercising independently       Able to understand and use Dyspnea scale Yes       Intervention Provide education and explanation on how to use Dyspnea scale       Expected Outcomes Long Term: Able to use Dyspnea scale to guide intensity level when exercising independently;Short Term: Able to use Dyspnea scale daily in  rehab to express subjective sense of shortness of breath during exertion       Knowledge and understanding of Target Heart Rate Range (THRR) Yes       Intervention Provide education and explanation of THRR including how the numbers were predicted and where they are located for reference       Expected Outcomes Short Term: Able to state/look up THRR;Short Term: Able to use daily as guideline for intensity in rehab;Long Term: Able to use THRR to govern intensity when exercising independently       Able to check pulse independently Yes       Intervention Review the importance of being able to check your own pulse for safety during independent exercise;Provide education and demonstration on how to check pulse in carotid and radial arteries.       Expected Outcomes Long Term: Able to check pulse independently and accurately;Short Term: Able to explain why pulse checking is important during independent exercise       Understanding of Exercise  Prescription Yes       Intervention Provide education, explanation, and written materials on patient's individual exercise prescription       Expected Outcomes Long Term: Able to explain home exercise prescription to exercise independently;Short Term: Able to explain program exercise prescription          Exercise Goals Re-Evaluation :  Exercise Goals Re-Evaluation     Row Name 08/26/24 0801             Exercise Goal Re-Evaluation   Exercise Goals Review Increase Physical Activity;Increase Strength and Stamina;Able to understand and use Dyspnea scale;Able to check pulse independently;Knowledge and understanding of Target Heart Rate Range (THRR);Able to understand and use rate of perceived exertion (RPE) scale;Understanding of Exercise Prescription       Comments Reviewed RPE and dyspnea scale, THR and program prescription with pt today.  Pt voiced understanding and was given a copy of goals to take home.       Expected Outcomes Short: Use RPE daily to  regulate intensity.  Long: Follow program prescription in THR.           Discharge Exercise Prescription (Final Exercise Prescription Changes):  Exercise Prescription Changes - 08/22/24 0900       Response to Exercise   Blood Pressure (Admit) 124/62    Blood Pressure (Exercise) 136/80    Blood Pressure (Exit) 126/70    Heart Rate (Admit) 68 bpm    Heart Rate (Exercise) 91 bpm    Heart Rate (Exit) 72 bpm    Oxygen Saturation (Admit) 99 %    Oxygen Saturation (Exercise) 98 %    Rating of Perceived Exertion (Exercise) 9    Perceived Dyspnea (Exercise) 1    Symptoms SOB, legs felt weak    Comments walk test results          Nutrition:  Target Goals: Understanding of nutrition guidelines, daily intake of sodium 1500mg , cholesterol 200mg , calories 30% from fat and 7% or less from saturated fats, daily to have 5 or more servings of fruits and vegetables.  Biometrics:  Pre Biometrics - 08/22/24 0942       Pre Biometrics   Height 6' 2 (1.88 m)    Weight 263 lb 8 oz (119.5 kg)    Waist Circumference 45 inches    Hip Circumference 44.5 inches    Waist to Hip Ratio 1.01 %    BMI (Calculated) 33.82    Grip Strength 24.3 kg    Single Leg Stand 15.2 seconds           Nutrition Therapy Plan and Nutrition Goals:  Nutrition Therapy & Goals - 08/21/24 1403       Intervention Plan   Intervention Prescribe, educate and counsel regarding individualized specific dietary modifications aiming towards targeted core components such as weight, hypertension, lipid management, diabetes, heart failure and other comorbidities.;Nutrition handout(s) given to patient.    Expected Outcomes Short Term Goal: A plan has been developed with personal nutrition goals set during dietitian appointment.;Long Term Goal: Adherence to prescribed nutrition plan.;Short Term Goal: Understand basic principles of dietary content, such as calories, fat, sodium, cholesterol and nutrients.          Nutrition  Assessments:  MEDIFICTS Score Key: >=70 Need to make dietary changes  40-70 Heart Healthy Diet <= 40 Therapeutic Level Cholesterol Diet  Flowsheet Row CARDIAC REHAB PHASE II ORIENTATION from 08/22/2024 in [facility] CARDIAC REHABILITATION  Picture Your Plate Total Score on Admission 64   Picture  Your Plate Scores: <59 Unhealthy dietary pattern with much room for improvement. 41-50 Dietary pattern unlikely to meet recommendations for good health and room for improvement. 51-60 More healthful dietary pattern, with some room for improvement.  >60 Healthy dietary pattern, although there may be some specific behaviors that could be improved.    Nutrition Goals Re-Evaluation:   Nutrition Goals Discharge (Final Nutrition Goals Re-Evaluation):   Psychosocial: Target Goals: Acknowledge presence or absence of significant depression and/or stress, maximize coping skills, provide positive support system. Participant is able to verbalize types and ability to use techniques and skills needed for reducing stress and depression.  Initial Review & Psychosocial Screening:  Initial Psych Review & Screening - 08/21/24 1403       Initial Review   Current issues with None Identified      Family Dynamics   Good Support System? Yes    Comments Lives with two brothers who are his support.      Barriers   Psychosocial barriers to participate in program There are no identifiable barriers or psychosocial needs.      Screening Interventions   Interventions Encouraged to exercise    Expected Outcomes Short Term goal: Utilizing psychosocial counselor, staff and physician to assist with identification of specific Stressors or current issues interfering with healing process. Setting desired goal for each stressor or current issue identified.;Long Term Goal: Stressors or current issues are controlled or eliminated.;Long Term goal: The participant improves quality of Life and PHQ9 Scores as seen by post  scores and/or verbalization of changes;Short Term goal: Identification and review with participant of any Quality of Life or Depression concerns found by scoring the questionnaire.          Quality of Life Scores:  Scores of 19 and below usually indicate a poorer quality of life in these areas.  A difference of  2-3 points is a clinically meaningful difference.  A difference of 2-3 points in the total score of the Quality of Life Index has been associated with significant improvement in overall quality of life, self-image, physical symptoms, and general health in studies assessing change in quality of life.  PHQ-9: Review Flowsheet       08/22/2024 04/02/2024 01/02/2024  Depression screen PHQ 2/9  Decreased Interest 0 0 2  Down, Depressed, Hopeless 1 0 0  PHQ - 2 Score 1 0 2  Altered sleeping 2 - 3  Tired, decreased energy 0 - 3  Change in appetite 0 - 3  Feeling bad or failure about yourself  0 - 0  Trouble concentrating 1 - 1  Moving slowly or fidgety/restless 1 - 0  Suicidal thoughts 0 - 0  PHQ-9 Score 5 - 12   Difficult doing work/chores Somewhat difficult - Somewhat difficult    Details       Data saved with a previous flowsheet row definition        Interpretation of Total Score  Total Score Depression Severity:  1-4 = Minimal depression, 5-9 = Mild depression, 10-14 = Moderate depression, 15-19 = Moderately severe depression, 20-27 = Severe depression   Psychosocial Evaluation and Intervention:  Psychosocial Evaluation - 08/21/24 1404       Psychosocial Evaluation & Interventions   Interventions Encouraged to exercise with the program and follow exercise prescription    Comments Shanti is coming into rehab for S/P aortic valve replacement. 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 He is hopeful to go back part time  after a little while being recovered. He does walk some at home, inside unless it is above 50 degrees  outside. He lives in an apartment with his two brothers who are his support system. He is diabetic and takes Metformin  and Lantus  for it. His mobility is fine and doesn't report any trouble with anything besides he had asthma when he was younger.    Continue Psychosocial Services  Follow up required by staff          Psychosocial Re-Evaluation:   Psychosocial Discharge (Final Psychosocial Re-Evaluation):   Vocational Rehabilitation: Provide vocational rehab assistance to qualifying candidates.   Vocational Rehab Evaluation & Intervention:  Vocational Rehab - 08/21/24 1402       Initial Vocational Rehab Evaluation & Intervention   Assessment shows need for Vocational Rehabilitation No          Education: Education Goals: Education classes will be provided on a weekly basis, covering required topics. Participant will state understanding/return demonstration of topics presented.  Learning Barriers/Preferences:  Learning Barriers/Preferences - 08/21/24 1403       Learning Barriers/Preferences   Learning Barriers None    Learning Preferences None          Education Topics: Hypertension, Hypertension Reduction -Define heart disease and high blood pressure. Discus how high blood pressure affects the body and ways to reduce high blood pressure.   Exercise and Your Heart -Discuss why it is important to exercise, the FITT principles of exercise, normal and abnormal responses to exercise, and how to exercise safely.   Angina -Discuss definition of angina, causes of angina, treatment of angina, and how to decrease risk of having angina.   Cardiac Medications -Review what the following cardiac medications are used for, how they affect the body, and side effects that may occur when taking the medications.  Medications include Aspirin , Beta blockers, calcium  channel blockers, ACE Inhibitors, angiotensin receptor blockers, diuretics, digoxin, and  antihyperlipidemics.   Congestive Heart Failure -Discuss the definition of CHF, how to live with CHF, the signs and symptoms of CHF, and how keep track of weight and sodium intake.   Heart Disease and Intimacy -Discus the effect sexual activity has on the heart, how changes occur during intimacy as we age, and safety during sexual activity.   Smoking Cessation / COPD -Discuss different methods to quit smoking, the health benefits of quitting smoking, and the definition of COPD.   Nutrition I: Fats -Discuss the types of cholesterol, what cholesterol does to the heart, and how cholesterol levels can be controlled.   Nutrition II: Labels -Discuss the different components of food labels and how to read food label   Heart Parts/Heart Disease and PAD -Discuss the anatomy of the heart, the pathway of blood circulation through the heart, and these are affected by heart disease.   Stress I: Signs and Symptoms -Discuss the causes of stress, how stress may lead to anxiety and depression, and ways to limit stress.   Stress II: Relaxation -Discuss different types of relaxation techniques to limit stress.   Warning Signs of Stroke / TIA -Discuss definition of a stroke, what the signs and symptoms are of a stroke, and how to identify when someone is having stroke.   Knowledge Questionnaire Score:   Core Components/Risk Factors/Patient Goals at Admission:  Personal Goals and Risk Factors at Admission - 08/22/24 0943       Core Components/Risk Factors/Patient Goals on Admission    Weight Management Yes;Obesity;Weight Loss    Intervention Weight  Management: Develop a combined nutrition and exercise program designed to reach desired caloric intake, while maintaining appropriate intake of nutrient and fiber, sodium and fats, and appropriate energy expenditure required for the weight goal.;Weight Management: Provide education and appropriate resources to help participant work on and attain  dietary goals.;Weight Management/Obesity: Establish reasonable short term and long term weight goals.;Obesity: Provide education and appropriate resources to help participant work on and attain dietary goals.    Admit Weight 263 lb 8 oz (119.5 kg)    Goal Weight: Short Term 258 lb (117 kg)    Goal Weight: Long Term 253 lb (114.8 kg)    Expected Outcomes Short Term: Continue to assess and modify interventions until short term weight is achieved;Long Term: Adherence to nutrition and physical activity/exercise program aimed toward attainment of established weight goal;Weight Loss: Understanding of general recommendations for a balanced deficit meal plan, which promotes 1-2 lb weight loss per week and includes a negative energy balance of [phone] kcal/d;Understanding recommendations for meals to include 15-35% energy as protein, 25-35% energy from fat, 35-60% energy from carbohydrates, less than 200mg  of dietary cholesterol, 20-35 gm of total fiber daily;Understanding of distribution of calorie intake throughout the day with the consumption of 4-5 meals/snacks    Diabetes Yes    Intervention Provide education about signs/symptoms and action to take for hypo/hyperglycemia.;Provide education about proper nutrition, including hydration, and aerobic/resistive exercise prescription along with prescribed medications to achieve blood glucose in normal ranges: Fasting glucose 65-99 mg/dL    Expected Outcomes Short Term: Participant verbalizes understanding of the signs/symptoms and immediate care of hyper/hypoglycemia, proper foot care and importance of medication, aerobic/resistive exercise and nutrition plan for blood glucose control.;Long Term: Attainment of HbA1C < 7%.    Hypertension Yes    Intervention Monitor prescription use compliance.;Provide education on lifestyle modifcations including regular physical activity/exercise, weight management, moderate sodium restriction and increased consumption of fresh  fruit, vegetables, and low fat dairy, alcohol moderation, and smoking cessation.    Expected Outcomes Long Term: Maintenance of blood pressure at goal levels.;Short Term: Continued assessment and intervention until BP is < 140/6mm HG in hypertensive participants. < 130/46mm HG in hypertensive participants with diabetes, heart failure or chronic kidney disease.          Core Components/Risk Factors/Patient Goals Review:    Core Components/Risk Factors/Patient Goals at Discharge (Final Review):    ITP Comments:  ITP Comments     Row Name 08/21/24 1409 08/22/24 0937 08/26/24 0801 09/03/24 1219     ITP Comments Completed virtual orientation today.  EP evaluation is scheduled for 08/22/24 at 0830 .  Documentation for diagnosis can be found in [facility] encounter 07/01/24. Patient attend orientation today.  Patient is attending Cardiac Rehabilitation Program.  Documentation for diagnosis can be found in [facility] 07/01/24.  Reviewed medical chart, RPE/RPD, gym safety, and program guidelines.  Patient was fitted to equipment they will be using during rehab.  Patient is scheduled to start exercise on Monday 08/26/24 at 915.   Initial ITP created and sent for review and signature by Dr. Dorn Ross, Medical Director for Cardiac Rehabilitation Program. First full day of exercise!  Patient was oriented to gym and equipment including functions, settings, policies, and procedures.  Patient's individual exercise prescription and treatment plan were reviewed.  All starting workloads were established based on the results of the 6 minute walk test done at initial orientation visit.  The plan for exercise progression was also introduced and progression will be customized based  on patient's performance and goals. 30 day review completed. ITP sent to Dr. Dorn Ross, Medical Director of Cardiac Rehab. Continue with ITP unless changes are made by physician.    New to program       Comments: 30 day review     [1]   Current Outpatient Medications:    acetaminophen  (TYLENOL ) 500 MG tablet, Take 1-2 tablets (500-1,000 mg total) by mouth every 6 (six) hours as needed., Disp: 30 tablet, Rfl: 0   amiodarone  (PACERONE ) 200 MG tablet, Take 1 tablet (200 mg total) by mouth daily., Disp: 38 tablet, Rfl: 0   insulin  glargine (LANTUS  SOLOSTAR) 100 UNIT/ML Solostar Pen, Inject 20 Units into the skin daily., Disp: 15 mL, Rfl: 0   Insulin  Pen Needle (PEN NEEDLES) 31G X 5 MM MISC, 1 each by Does not apply route daily. May substitute to any manufacturer covered by patient's insurance., Disp: 100 each, Rfl: 0   metFORMIN  (GLUCOPHAGE -XR) 750 MG 24 hr tablet, Take 1 tablet (750 mg total) by mouth daily with breakfast., Disp: 90 tablet, Rfl: 0   metoprolol  succinate (TOPROL  XL) 25 MG 24 hr tablet, Take 1 tablet (25 mg total) by mouth daily., Disp: 90 tablet, Rfl: 3   OVER THE COUNTER MEDICATION, Take 1 tablet by mouth daily. Boost TRT Gummy, Disp: , Rfl:    warfarin (COUMADIN ) 3 MG tablet, Take 1 to 2 tablets daily as directed by coumadin  clinic, Disp: 60 tablet, Rfl: 3 [2]  Social History Tobacco Use  Smoking Status Never  Smokeless Tobacco Never

## 2024-09-04 ENCOUNTER — Encounter (HOSPITAL_COMMUNITY)
Admission: RE | Admit: 2024-09-04 | Discharge: 2024-09-04 | Disposition: A | Discharge status: Home / Self Care | Source: Ambulatory Visit | Attending: Internal Medicine | Admitting: Internal Medicine

## 2024-09-04 ENCOUNTER — Ambulatory Visit: Attending: Internal Medicine | Admitting: *Deleted

## 2024-09-04 DIAGNOSIS — Z5181 Encounter for therapeutic drug level monitoring: Secondary | ICD-10-CM

## 2024-09-04 DIAGNOSIS — Z953 Presence of xenogenic heart valve: Secondary | ICD-10-CM | POA: Diagnosis not present

## 2024-09-04 DIAGNOSIS — Z8679 Personal history of other diseases of the circulatory system: Secondary | ICD-10-CM

## 2024-09-04 LAB — POCT INR: INR: 2.1 (ref 2.0–3.0)

## 2024-09-04 NOTE — Progress Notes (Signed)
 Daily Session Note  Patient Details  Name: Johnathan Richmond MRN: [id number] Date of Birth: Jun 22, 1961 Referring Provider:   Flowsheet Row CARDIAC REHAB PHASE II ORIENTATION from 08/22/2024 in [facility] CARDIAC REHABILITATION  Referring Provider Stacia Diannah Proffer MD    Encounter Date: 09/04/2024  Check In:  Session Check In - 09/04/24 0919       Check-In   Supervising physician immediately available to respond to emergencies See telemetry face sheet for immediately available MD    Location [facility]    Staff Present Powell Benders, BS, Exercise Physiologist;Brittany Jackquline, BSN, RN, Rosalba Gelineau, MA, RCEP, CCRP, CCET    Virtual Visit No    Medication changes reported     No    Fall or balance concerns reported    No    Tobacco Cessation No Change    Warm-up and Cool-down Performed on first and last piece of equipment    Resistance Training Performed Yes    VAD Patient? No    PAD/SET Patient? No      Pain Assessment   Currently in Pain? No/denies    Multiple Pain Sites No          Capillary Blood Glucose: No results found for this or any previous visit (from the past 24 hours).    Tobacco Use History[1]  Goals Met:  Independence with exercise equipment Exercise tolerated well No report of concerns or symptoms today Strength training completed today  Goals Unmet:  Not Applicable  Comments: Pt able to follow exercise prescription today without complaint.  Will continue to monitor for progression.        [1]  Social History Tobacco Use  Smoking Status Never  Smokeless Tobacco Never

## 2024-09-04 NOTE — Progress Notes (Signed)
 INR 2.1; Please see anticoagulation encounter

## 2024-09-04 NOTE — Patient Instructions (Signed)
 Continue warfarin 1 tablet daily except 2 tablets on Sundays and Wednesday. Recheck in 3 wk

## 2024-09-06 ENCOUNTER — Encounter (HOSPITAL_COMMUNITY)
Admission: RE | Admit: 2024-09-06 | Discharge: 2024-09-06 | Disposition: A | Discharge status: Home / Self Care | Source: Ambulatory Visit | Attending: Internal Medicine | Admitting: Internal Medicine

## 2024-09-06 DIAGNOSIS — Z953 Presence of xenogenic heart valve: Secondary | ICD-10-CM | POA: Diagnosis present

## 2024-09-06 NOTE — Progress Notes (Signed)
 Daily Session Note  Patient Details  Name: Johnathan Richmond MRN: [id number] Date of Birth: 1960-12-18 Referring Provider:   Flowsheet Row CARDIAC REHAB PHASE II ORIENTATION from 08/22/2024 in [facility] CARDIAC REHABILITATION  Referring Provider Stacia Diannah Proffer MD    Encounter Date: 09/06/2024  Check In:  Session Check In - 09/06/24 0919       Check-In   Supervising physician immediately available to respond to emergencies See telemetry face sheet for immediately available MD    Location [facility]    Staff Present Laymon Rattler, BSN, RN, WTA-C;Heather Con, BS, Exercise Physiologist    Virtual Visit No    Medication changes reported     No    Tobacco Cessation No Change    Warm-up and Cool-down Performed on first and last piece of equipment    Resistance Training Performed Yes    VAD Patient? No    PAD/SET Patient? No      Pain Assessment   Currently in Pain? No/denies          Capillary Blood Glucose: No results found for this or any previous visit (from the past 24 hours).    Tobacco Use History[1]  Goals Met:  Independence with exercise equipment Exercise tolerated well No report of concerns or symptoms today Strength training completed today  Goals Unmet:  Not Applicable  Comments: Pt able to follow exercise prescription today without complaint.  Will continue to monitor for progression.        [1]  Social History Tobacco Use  Smoking Status Never  Smokeless Tobacco Never

## 2024-09-09 ENCOUNTER — Encounter (HOSPITAL_COMMUNITY)
Admission: RE | Admit: 2024-09-09 | Discharge: 2024-09-09 | Disposition: A | Discharge status: Home / Self Care | Source: Ambulatory Visit | Attending: Internal Medicine | Admitting: Internal Medicine

## 2024-09-09 DIAGNOSIS — Z953 Presence of xenogenic heart valve: Secondary | ICD-10-CM | POA: Diagnosis not present

## 2024-09-09 NOTE — Progress Notes (Signed)
 Daily Session Note  Patient Details  Name: TIERNAN SUTO MRN: [id number] Date of Birth: Aug 10, 1961 Referring Provider:   Flowsheet Row CARDIAC REHAB PHASE II ORIENTATION from 08/22/2024 in [facility] CARDIAC REHABILITATION  Referring Provider Stacia Diannah Proffer MD    Encounter Date: 09/09/2024  Check In:  Session Check In - 09/09/24 9072       Check-In   Supervising physician immediately available to respond to emergencies See telemetry face sheet for immediately available MD    Location [facility]    Staff Present Powell Benders, BS, Exercise Physiologist;Brittany Jackquline, BSN, RN, Rosalba Gelineau, MA, RCEP, CCRP, CCET    Virtual Visit No    Medication changes reported     No    Fall or balance concerns reported    No    Tobacco Cessation No Change    Warm-up and Cool-down Performed on first and last piece of equipment    Resistance Training Performed Yes    VAD Patient? No    PAD/SET Patient? No      Pain Assessment   Currently in Pain? No/denies    Multiple Pain Sites No          Capillary Blood Glucose: No results found for this or any previous visit (from the past 24 hours).    Tobacco Use History[1]  Goals Met:  Independence with exercise equipment Exercise tolerated well No report of concerns or symptoms today Strength training completed today  Goals Unmet:  Not Applicable  Comments: Pt able to follow exercise prescription today without complaint.  Will continue to monitor for progression.        [1]  Social History Tobacco Use  Smoking Status Never  Smokeless Tobacco Never

## 2024-09-11 ENCOUNTER — Encounter (HOSPITAL_COMMUNITY)
Admission: RE | Admit: 2024-09-11 | Discharge: 2024-09-11 | Disposition: A | Discharge status: Home / Self Care | Source: Ambulatory Visit | Attending: Internal Medicine | Admitting: Internal Medicine

## 2024-09-11 DIAGNOSIS — Z953 Presence of xenogenic heart valve: Secondary | ICD-10-CM

## 2024-09-11 NOTE — Progress Notes (Signed)
 Daily Session Note  Patient Details  Name: Johnathan Richmond MRN: [id number] Date of Birth: 1961/08/14 Referring Provider:   Flowsheet Row CARDIAC REHAB PHASE II ORIENTATION from 08/22/2024 in [facility] CARDIAC REHABILITATION  Referring Provider Stacia Diannah Proffer MD    Encounter Date: 09/11/2024  Check In:  Session Check In - 09/11/24 0913       Check-In   Supervising physician immediately available to respond to emergencies See telemetry face sheet for immediately available MD    Location [facility]    Staff Present Powell Benders, BS, Exercise Physiologist;Beuford Garcilazo Jackquline, BSN, RN, Rosalba Gelineau, MA, RCEP, CCRP, CCET    Virtual Visit No    Medication changes reported     No    Fall or balance concerns reported    No    Tobacco Cessation No Change    Warm-up and Cool-down Performed on first and last piece of equipment    Resistance Training Performed Yes    VAD Patient? No    PAD/SET Patient? No      Pain Assessment   Currently in Pain? No/denies          Capillary Blood Glucose: No results found for this or any previous visit (from the past 24 hours).    Tobacco Use History[1]  Goals Met:  Independence with exercise equipment Exercise tolerated well No report of concerns or symptoms today Strength training completed today  Goals Unmet:  Not Applicable  Comments: Pt able to follow exercise prescription today without complaint.  Will continue to monitor for progression.        [1]  Social History Tobacco Use  Smoking Status Never  Smokeless Tobacco Never

## 2024-09-12 ENCOUNTER — Emergency Department (HOSPITAL_COMMUNITY)
Admission: EM | Admit: 2024-09-12 | Discharge: 2024-09-12 | Disposition: A | Discharge status: Home / Self Care | Attending: Emergency Medicine | Admitting: Emergency Medicine

## 2024-09-12 ENCOUNTER — Other Ambulatory Visit: Payer: Self-pay

## 2024-09-12 ENCOUNTER — Encounter (HOSPITAL_COMMUNITY): Payer: Self-pay

## 2024-09-12 DIAGNOSIS — Z7901 Long term (current) use of anticoagulants: Secondary | ICD-10-CM | POA: Insufficient documentation

## 2024-09-12 DIAGNOSIS — R600 Localized edema: Secondary | ICD-10-CM | POA: Diagnosis not present

## 2024-09-12 DIAGNOSIS — Z794 Long term (current) use of insulin: Secondary | ICD-10-CM | POA: Diagnosis not present

## 2024-09-12 DIAGNOSIS — M7989 Other specified soft tissue disorders: Secondary | ICD-10-CM | POA: Diagnosis present

## 2024-09-12 LAB — BASIC METABOLIC PANEL WITH GFR
Anion gap: 13 (ref 5–15)
BUN: 13 mg/dL (ref 8–23)
CO2: 22 mmol/L (ref 22–32)
Calcium: 9.4 mg/dL (ref 8.9–10.3)
Chloride: 103 mmol/L (ref 98–111)
Creatinine, Ser: 0.94 mg/dL (ref 0.61–1.24)
GFR, Estimated: >60 mL/min
Glucose, Bld: 102 mg/dL — ABNORMAL HIGH (ref 70–99)
Potassium: 4.5 mmol/L (ref 3.5–5.1)
Sodium: 138 mmol/L (ref 135–145)

## 2024-09-12 LAB — CBC
HCT: 44.3 % (ref 39.0–52.0)
Hemoglobin: 14.2 g/dL (ref 13.0–17.0)
MCH: 26.5 pg (ref 26.0–34.0)
MCHC: 32.1 g/dL (ref 30.0–36.0)
MCV: 82.6 fL (ref 80.0–100.0)
Platelets: 238 K/uL (ref 150–400)
RBC: 5.36 MIL/uL (ref 4.22–5.81)
RDW: 14.8 % (ref 11.5–15.5)
WBC: 4.4 K/uL (ref 4.0–10.5)
nRBC: 0 % (ref 0.0–0.2)

## 2024-09-12 MED ORDER — FUROSEMIDE 10 MG/ML IJ SOLN
40.0000 mg | Freq: Once | INTRAMUSCULAR | Status: AC
  Administered 2024-09-12: 40 mg via INTRAVENOUS
  Filled 2024-09-12: qty 4

## 2024-09-12 MED ORDER — FUROSEMIDE 20 MG PO TABS: 20.0000 mg | ORAL_TABLET | Freq: Every day | ORAL | 0 refills | Status: DC

## 2024-09-12 NOTE — ED Triage Notes (Signed)
 Pt reports BIL LE swelling x3 days. Pt states that he had heart surgery 10/27.

## 2024-09-12 NOTE — Discharge Instructions (Signed)
 Keep your legs elevated is much as possible follow-up with your doctor next week

## 2024-09-13 ENCOUNTER — Encounter (HOSPITAL_COMMUNITY): Admission: RE | Admit: 2024-09-13 | Source: Ambulatory Visit

## 2024-09-15 NOTE — ED Provider Notes (Signed)
 " [place] EMERGENCY DEPARTMENT AT [facility] Provider Note   CSN: [id number] Arrival date & time: 09/12/24  1607     Patient presents with: Leg Swelling   Johnathan Richmond is a 64 y.o. male.   Patient complains of mild swelling to both of his feet  The history is provided by the patient and medical records. No language interpreter was used.  Foot Pain This is a new problem. The current episode started more than 1 week ago. The problem occurs constantly. The problem has not changed since onset.Pertinent negatives include no chest pain, no abdominal pain and no headaches. Nothing aggravates the symptoms. Nothing relieves the symptoms. He has tried nothing for the symptoms.       Prior to Admission medications  Medication Sig Start Date End Date Taking? Authorizing Provider  furosemide  (LASIX ) 20 MG tablet Take 1 tablet (20 mg total) by mouth daily. 09/12/24  Yes Priscilla Finklea, MD  acetaminophen  (TYLENOL ) 500 MG tablet Take 1-2 tablets (500-1,000 mg total) by mouth every 6 (six) hours as needed. 07/06/24   Barrett, Erin R, PA-C  amiodarone  (PACERONE ) 200 MG tablet Take 1 tablet (200 mg total) by mouth daily. 07/24/24 08/31/24  Vangilder Laymon HERO, PA-C  insulin  glargine (LANTUS  SOLOSTAR) 100 UNIT/ML Solostar Pen Inject 20 Units into the skin daily. 07/06/24   Barrett, Erin R, PA-C  Insulin  Pen Needle (PEN NEEDLES) 31G X 5 MM MISC 1 each by Does not apply route daily. May substitute to any manufacturer covered by patient's insurance. 05/02/24   Geroldine Berg, MD  metFORMIN  (GLUCOPHAGE -XR) 750 MG 24 hr tablet Take 1 tablet (750 mg total) by mouth daily with breakfast. 08/21/24   Grooms, Courtney, PA-C  metoprolol  succinate (TOPROL  XL) 25 MG 24 hr tablet Take 1 tablet (25 mg total) by mouth daily. 07/24/24   Strader, Laymon HERO, PA-C  OVER THE COUNTER MEDICATION Take 1 tablet by mouth daily. Boost TRT Gummy    [provider]  warfarin (COUMADIN ) 3 MG tablet Take 1 to 2  tablets daily as directed by coumadin  clinic 08/07/24   Mallipeddi, Diannah SQUIBB, MD    Allergies: Patient has no known allergies.    Review of Systems  Constitutional:  Negative for appetite change and fatigue.  HENT:  Negative for congestion, ear discharge and sinus pressure.   Eyes:  Negative for discharge.  Respiratory:  Negative for cough.   Cardiovascular:  Negative for chest pain.  Gastrointestinal:  Negative for abdominal pain and diarrhea.  Genitourinary:  Negative for frequency and hematuria.  Musculoskeletal:  Negative for back pain.       Swelling to both feet  Skin:  Negative for rash.  Neurological:  Negative for seizures and headaches.  Psychiatric/Behavioral:  Negative for hallucinations.     Updated Vital Signs BP (!) 150/82   Pulse 61   Temp 98.3 F (36.8 C) (Oral)   Resp 15   Ht 6' 2 (1.88 m)   Wt 116.6 kg   SpO2 97%   BMI 33.00 kg/m   Physical Exam Vitals and nursing note reviewed.  Constitutional:      Appearance: He is well-developed.  HENT:     Head: Normocephalic.     Nose: Nose normal.  Eyes:     General: No scleral icterus.    Conjunctiva/sclera: Conjunctivae normal.  Neck:     Thyroid : No thyromegaly.  Cardiovascular:     Rate and Rhythm: Normal rate and regular rhythm.  Heart sounds: No murmur heard.    No friction rub. No gallop.  Pulmonary:     Breath sounds: No stridor. No wheezing or rales.  Chest:     Chest wall: No tenderness.  Abdominal:     General: There is no distension.     Tenderness: There is no abdominal tenderness. There is no rebound.  Musculoskeletal:        General: Normal range of motion.     Cervical back: Neck supple.     Comments: Minimal edema in feet  Lymphadenopathy:     Cervical: No cervical adenopathy.  Skin:    Findings: No erythema or rash.  Neurological:     Mental Status: He is alert and oriented to person, place, and time.     Motor: No abnormal muscle tone.     Coordination: Coordination  normal.  Psychiatric:        Behavior: Behavior normal.     (all labs ordered are listed, but only abnormal results are displayed) Labs Reviewed  BASIC METABOLIC PANEL WITH GFR - Abnormal; Notable for the following components:      Result Value   Glucose, Bld 102 (*)    All other components within normal limits  CBC    EKG: EKG Interpretation Date/Time:  Thursday September 12 2024 21:04:05 EST Ventricular Rate:  63 PR Interval:  205 QRS Duration:  152 QT Interval:  445 QTC Calculation: 456 R Axis:   -39  Text Interpretation: Sinus rhythm Right bundle branch block No significant change since last tracing Confirmed by Dasie Faden ([id number]) on 09/13/2024 10:10:14 AM  Radiology: No results found.   Procedures   Medications Ordered in the ED  furosemide  (LASIX ) injection 40 mg (40 mg Intravenous Given 09/12/24 2126)                                    Medical Decision Making Amount and/or Complexity of Data Reviewed Labs: ordered.  Risk Prescription drug management.   Patient with peripheral edema.  He is given a short course of Lasix  and will follow-up with the PCP     Final diagnoses:  Leg edema    ED Discharge Orders          Ordered    furosemide  (LASIX ) 20 MG tablet  Daily        09/12/24 2154               Suzette Pac, MD 09/15/24 1053  "

## 2024-09-16 ENCOUNTER — Encounter (HOSPITAL_COMMUNITY)
Admission: RE | Admit: 2024-09-16 | Discharge: 2024-09-16 | Disposition: A | Source: Ambulatory Visit | Attending: Internal Medicine

## 2024-09-16 DIAGNOSIS — Z953 Presence of xenogenic heart valve: Secondary | ICD-10-CM | POA: Diagnosis not present

## 2024-09-16 LAB — GLUCOSE, CAPILLARY: Glucose-Capillary: 121 mg/dL — ABNORMAL HIGH (ref 70–99)

## 2024-09-16 NOTE — Progress Notes (Signed)
 Daily Session Note  Patient Details  Name: Johnathan Richmond MRN: [id number] Date of Birth: May 24, 1961 Referring Provider:   Flowsheet Row CARDIAC REHAB PHASE II ORIENTATION from 08/22/2024 in [facility] CARDIAC REHABILITATION  Referring Provider Stacia Diannah Proffer MD    Encounter Date: 09/16/2024  Check In:  Session Check In - 09/16/24 9076       Check-In   Supervising physician immediately available to respond to emergencies See telemetry face sheet for immediately available MD    Location [facility]    Staff Present Powell Benders, BS, Exercise Physiologist;Brittany Jackquline, BSN, RN, Rosalba Gelineau, MA, RCEP, CCRP, CCET    Virtual Visit No    Medication changes reported     No    Fall or balance concerns reported    No    Tobacco Cessation No Change    Warm-up and Cool-down Performed on first and last piece of equipment    Resistance Training Performed Yes    VAD Patient? No    PAD/SET Patient? No      Pain Assessment   Currently in Pain? No/denies    Pain Score 0-No pain    Multiple Pain Sites No          Capillary Blood Glucose: Results for orders placed or performed during the hospital encounter of 09/13/24 (from the past 24 hours)  Glucose, capillary     Status: Abnormal   Collection Time: 09/16/24  9:18 AM  Result Value Ref Range   Glucose-Capillary 121 (H) 70 - 99 mg/dL      Tobacco Use Ypdunmb[8]  Goals Met:  Independence with exercise equipment Exercise tolerated well No report of concerns or symptoms today Strength training completed today  Goals Unmet:  Not Applicable  Comments: Pt able to follow exercise prescription today without complaint.  Will continue to monitor for progression.        [1]  Social History Tobacco Use  Smoking Status Never  Smokeless Tobacco Never

## 2024-09-16 NOTE — Progress Notes (Signed)
 Reviewed home exercise with pt today.  Pt plans to walk at home and go to gym to play basketball for exercise.  Reviewed THR, pulse, RPE, sign and symptoms, pulse oximetery and when to call 911 or MD.  Also discussed weather considerations and indoor options.  Pt voiced understanding.

## 2024-09-18 ENCOUNTER — Encounter (HOSPITAL_COMMUNITY)
Admission: RE | Admit: 2024-09-18 | Discharge: 2024-09-18 | Disposition: A | Discharge status: Home / Self Care | Source: Ambulatory Visit | Attending: Internal Medicine | Admitting: Internal Medicine

## 2024-09-18 DIAGNOSIS — Z953 Presence of xenogenic heart valve: Secondary | ICD-10-CM | POA: Diagnosis not present

## 2024-09-18 NOTE — Progress Notes (Signed)
 Daily Session Note  Patient Details  Name: Johnathan Richmond MRN: [id number] Date of Birth: 1961-05-13 Referring Provider:   Flowsheet Row CARDIAC REHAB PHASE II ORIENTATION from 08/22/2024 in [facility] CARDIAC REHABILITATION  Referring Provider Stacia Diannah Proffer MD    Encounter Date: 09/18/2024  Check In:  Session Check In - 09/18/24 0915       Check-In   Supervising physician immediately available to respond to emergencies See telemetry face sheet for immediately available MD    Location [facility]    Staff Present Adrien Louder, RN, BSN;Heather Con, BS, Exercise Physiologist;Brittany Jackquline, BSN, RN, WTA-C    Virtual Visit No    Medication changes reported     No    Fall or balance concerns reported    No    Warm-up and Cool-down Performed on first and last piece of equipment    Resistance Training Performed Yes    VAD Patient? No    PAD/SET Patient? No      Pain Assessment   Currently in Pain? No/denies    Pain Score 0-No pain    Multiple Pain Sites No          Capillary Blood Glucose: No results found for this or any previous visit (from the past 24 hours).    Tobacco Use History[1]  Goals Met:  Independence with exercise equipment Exercise tolerated well No report of concerns or symptoms today Strength training completed today  Goals Unmet:  Not Applicable  Comments: Pt able to follow exercise prescription today without complaint.  Will continue to monitor for progression.        [1]  Social History Tobacco Use  Smoking Status Never  Smokeless Tobacco Never

## 2024-09-19 ENCOUNTER — Other Ambulatory Visit: Payer: Self-pay | Admitting: Physician Assistant

## 2024-09-19 DIAGNOSIS — E1165 Type 2 diabetes mellitus with hyperglycemia: Secondary | ICD-10-CM

## 2024-09-19 MED ORDER — LANTUS SOLOSTAR 100 UNIT/ML ~~LOC~~ SOPN: 20.0000 [IU] | PEN_INJECTOR | Freq: Every day | SUBCUTANEOUS | 0 refills | Status: DC

## 2024-09-19 NOTE — Telephone Encounter (Signed)
 Copied from CRM #[id number]. Topic: Clinical - Medication Refill >> Sep 19, 2024  2:11 PM Emylou G wrote: Medication: insulin  glargine (LANTUS  SOLOSTAR) 100 UNIT/ML Solostar Pen  Has the patient contacted their pharmacy? No (Agent: If no, request that the patient contact the pharmacy for the refill. If patient does not wish to contact the pharmacy document the reason why and proceed with request.) (Agent: If yes, when and what did the pharmacy advise?)  This is the patient's preferred pharmacy:  [facility] [phone] - [place], [place] - [address] AT [facility] [address] [place] [address] Phone: [phone] Fax: [phone]   Is this the correct pharmacy for this prescription? Yes If no, delete pharmacy and type the correct one.   Has the prescription been filled recently? No  Is the patient out of the medication? Yes  Has the patient been seen for an appointment in the last year OR does the patient have an upcoming appointment? Yes  Can we respond through MyChart? Yes  Agent: Please be advised that Rx refills may take up to 3 business days. We ask that you follow-up with your pharmacy.

## 2024-09-20 ENCOUNTER — Encounter (HOSPITAL_COMMUNITY)
Admission: RE | Admit: 2024-09-20 | Discharge: 2024-09-20 | Disposition: A | Discharge status: Home / Self Care | Source: Ambulatory Visit | Attending: Internal Medicine | Admitting: Internal Medicine

## 2024-09-20 DIAGNOSIS — Z953 Presence of xenogenic heart valve: Secondary | ICD-10-CM

## 2024-09-20 NOTE — Progress Notes (Signed)
 Daily Session Note  Patient Details  Name: Johnathan Richmond MRN: [id number] Date of Birth: March 14, 1961 Referring Provider:   Flowsheet Row CARDIAC REHAB PHASE II ORIENTATION from 08/22/2024 in [facility] CARDIAC REHABILITATION  Referring Provider Stacia Diannah Proffer MD    Encounter Date: 09/20/2024  Check In:  Session Check In - 09/20/24 9076       Check-In   Supervising physician immediately available to respond to emergencies See telemetry face sheet for immediately available MD    Location [facility]    Staff Present Laymon Rattler, BSN, RN, WTA-C;Victoria Zina, RN;Jessica Poole, MA, RCEP, CCRP, CCET    Virtual Visit No    Medication changes reported     No    Fall or balance concerns reported    No    Tobacco Cessation No Change    Warm-up and Cool-down Performed on first and last piece of equipment    Resistance Training Performed Yes    VAD Patient? No    PAD/SET Patient? No      Pain Assessment   Currently in Pain? No/denies          Capillary Blood Glucose: No results found for this or any previous visit (from the past 24 hours).    Tobacco Use History[1]  Goals Met:  Independence with exercise equipment Exercise tolerated well No report of concerns or symptoms today Strength training completed today  Goals Unmet:  Not Applicable  Comments: Pt able to follow exercise prescription today without complaint.  Will continue to monitor for progression.        [1]  Social History Tobacco Use  Smoking Status Never  Smokeless Tobacco Never

## 2024-09-23 ENCOUNTER — Encounter (HOSPITAL_COMMUNITY)
Admission: RE | Admit: 2024-09-23 | Discharge: 2024-09-23 | Disposition: A | Source: Ambulatory Visit | Attending: Internal Medicine

## 2024-09-23 DIAGNOSIS — Z953 Presence of xenogenic heart valve: Secondary | ICD-10-CM | POA: Diagnosis not present

## 2024-09-23 NOTE — Progress Notes (Signed)
 Daily Session Note  Patient Details  Name: Johnathan Richmond MRN: [id number] Date of Birth: 1961/08/09 Referring Provider:   Flowsheet Row CARDIAC REHAB PHASE II ORIENTATION from 08/22/2024 in [facility] CARDIAC REHABILITATION  Referring Provider Stacia Diannah Proffer MD    Encounter Date: 09/23/2024  Check In:  Session Check In - 09/23/24 0915       Check-In   Supervising physician immediately available to respond to emergencies See telemetry face sheet for immediately available MD    Location [facility]    Staff Present Adrien Louder, RN, BSN;Heather Con, BS, Exercise Physiologist;Brittany Jackquline, BSN, RN, WTA-C    Virtual Visit No    Medication changes reported     No    Fall or balance concerns reported    No    Warm-up and Cool-down Performed on first and last piece of equipment    Resistance Training Performed Yes    VAD Patient? No    PAD/SET Patient? No      Pain Assessment   Currently in Pain? No/denies    Pain Score 0-No pain    Multiple Pain Sites No          Capillary Blood Glucose: No results found for this or any previous visit (from the past 24 hours).    Tobacco Use History[1]  Goals Met:  Independence with exercise equipment Exercise tolerated well No report of concerns or symptoms today Strength training completed today  Goals Unmet:  Not Applicable  Comments: Pt able to follow exercise prescription today without complaint.  Will continue to monitor for progression.        [1]  Social History Tobacco Use  Smoking Status Never  Smokeless Tobacco Never

## 2024-09-25 ENCOUNTER — Encounter (HOSPITAL_COMMUNITY)
Admission: RE | Admit: 2024-09-25 | Discharge: 2024-09-25 | Disposition: A | Source: Ambulatory Visit | Attending: Internal Medicine

## 2024-09-25 ENCOUNTER — Ambulatory Visit: Attending: Internal Medicine | Admitting: *Deleted

## 2024-09-25 DIAGNOSIS — Z5181 Encounter for therapeutic drug level monitoring: Secondary | ICD-10-CM | POA: Diagnosis not present

## 2024-09-25 DIAGNOSIS — Z8679 Personal history of other diseases of the circulatory system: Secondary | ICD-10-CM

## 2024-09-25 DIAGNOSIS — Z953 Presence of xenogenic heart valve: Secondary | ICD-10-CM | POA: Diagnosis not present

## 2024-09-25 LAB — POCT INR: INR: 2.2 (ref 2.0–3.0)

## 2024-09-25 NOTE — Progress Notes (Signed)
 INR 2.2

## 2024-09-25 NOTE — Patient Instructions (Signed)
 Continue warfarin 1 tablet daily except 2 tablets on Sundays and Wednesday. Recheck in 4 wk

## 2024-09-25 NOTE — Progress Notes (Signed)
 Daily Session Note  Patient Details  Name: Johnathan Richmond MRN: [id number] Date of Birth: 1961/03/09 Referring Provider:   Flowsheet Row CARDIAC REHAB PHASE II ORIENTATION from 08/22/2024 in [facility] CARDIAC REHABILITATION  Referring Provider Stacia Diannah Proffer MD    Encounter Date: 09/25/2024  Check In:  Session Check In - 09/25/24 0934       Check-In   Supervising physician immediately available to respond to emergencies See telemetry face sheet for immediately available MD    Location [facility]    Staff Present Powell Benders, BS, Exercise Physiologist;Hillary Dean BSN, RN    Virtual Visit No    Medication changes reported     No    Fall or balance concerns reported    No    Tobacco Cessation No Change    Warm-up and Cool-down Performed on first and last piece of equipment    Resistance Training Performed Yes    VAD Patient? No    PAD/SET Patient? No      Pain Assessment   Currently in Pain? No/denies    Pain Score 0-No pain    Multiple Pain Sites No          Capillary Blood Glucose: No results found for this or any previous visit (from the past 24 hours).    Tobacco Use History[1]  Goals Met:  Independence with exercise equipment Exercise tolerated well No report of concerns or symptoms today Strength training completed today  Goals Unmet:  Not Applicable  Comments: Pt able to follow exercise prescription today without complaint.  Will continue to monitor for progression.        [1]  Social History Tobacco Use  Smoking Status Never  Smokeless Tobacco Never

## 2024-09-26 DIAGNOSIS — Z7901 Long term (current) use of anticoagulants: Secondary | ICD-10-CM | POA: Insufficient documentation

## 2024-09-26 DIAGNOSIS — E039 Hypothyroidism, unspecified: Secondary | ICD-10-CM | POA: Diagnosis not present

## 2024-09-26 DIAGNOSIS — Z794 Long term (current) use of insulin: Secondary | ICD-10-CM | POA: Insufficient documentation

## 2024-09-26 DIAGNOSIS — Z79899 Other long term (current) drug therapy: Secondary | ICD-10-CM | POA: Diagnosis not present

## 2024-09-26 DIAGNOSIS — Z7984 Long term (current) use of oral hypoglycemic drugs: Secondary | ICD-10-CM | POA: Diagnosis not present

## 2024-09-26 DIAGNOSIS — R600 Localized edema: Secondary | ICD-10-CM | POA: Insufficient documentation

## 2024-09-26 DIAGNOSIS — I10 Essential (primary) hypertension: Secondary | ICD-10-CM | POA: Diagnosis not present

## 2024-09-26 DIAGNOSIS — M7989 Other specified soft tissue disorders: Secondary | ICD-10-CM | POA: Insufficient documentation

## 2024-09-26 DIAGNOSIS — E119 Type 2 diabetes mellitus without complications: Secondary | ICD-10-CM | POA: Diagnosis not present

## 2024-09-27 ENCOUNTER — Other Ambulatory Visit: Payer: Self-pay

## 2024-09-27 ENCOUNTER — Encounter (HOSPITAL_COMMUNITY)
Admission: RE | Admit: 2024-09-27 | Discharge: 2024-09-27 | Disposition: A | Source: Ambulatory Visit | Attending: Internal Medicine

## 2024-09-27 ENCOUNTER — Encounter (HOSPITAL_COMMUNITY): Payer: Self-pay

## 2024-09-27 ENCOUNTER — Emergency Department (HOSPITAL_COMMUNITY)
Admission: EM | Admit: 2024-09-27 | Discharge: 2024-09-27 | Disposition: A | Discharge status: Home / Self Care | Attending: Emergency Medicine | Admitting: Emergency Medicine

## 2024-09-27 DIAGNOSIS — Z953 Presence of xenogenic heart valve: Secondary | ICD-10-CM | POA: Diagnosis not present

## 2024-09-27 DIAGNOSIS — R600 Localized edema: Secondary | ICD-10-CM

## 2024-09-27 LAB — CBG MONITORING, ED: Glucose-Capillary: 99 mg/dL (ref 70–99)

## 2024-09-27 MED ORDER — FUROSEMIDE 20 MG PO TABS: 20.0000 mg | ORAL_TABLET | Freq: Every day | ORAL | 0 refills | Status: AC

## 2024-09-27 NOTE — Discharge Instructions (Addendum)
 When you see your primary doctor on January 30, please tell them to consider putting you on Lasix  daily long-term You also need to have your labs rechecked

## 2024-09-27 NOTE — ED Triage Notes (Signed)
 Pt pov from home c/o ankle swelling. Pt reports heart surgery 10/27 and taking fluid pills but being out of medication. Pt reports being concerned that this is the cause of ankle swelling.

## 2024-09-27 NOTE — Progress Notes (Signed)
 Daily Session Note  Patient Details  Name: SAMPSON SELF MRN: [id number] Date of Birth: 15-Jun-1961 Referring Provider:   Flowsheet Row CARDIAC REHAB PHASE II ORIENTATION from 08/22/2024 in [facility] CARDIAC REHABILITATION  Referring Provider Stacia Diannah Proffer MD    Encounter Date: 09/27/2024  Check In:  Session Check In - 09/27/24 0924       Check-In   Location [facility]    Staff Present Powell Benders, BS, Exercise Physiologist;Brittany Jackquline, BSN, RN, WTA-C;Victoria Zina, RN    Virtual Visit No    Medication changes reported     No    Fall or balance concerns reported    No    Tobacco Cessation No Change    Resistance Training Performed Yes    VAD Patient? No    PAD/SET Patient? No      Pain Assessment   Currently in Pain? No/denies    Pain Score 0-No pain    Multiple Pain Sites No          Capillary Blood Glucose: Results for orders placed or performed during the hospital encounter of 09/27/24 (from the past 24 hours)  CBG monitoring, ED     Status: None   Collection Time: 09/27/24 12:46 AM  Result Value Ref Range   Glucose-Capillary 99 70 - 99 mg/dL      Tobacco Use Ypdunmb[8]  Goals Met:  Independence with exercise equipment Exercise tolerated well No report of concerns or symptoms today Strength training completed today  Goals Unmet:  Not Applicable  Comments: Pt able to follow exercise prescription today without complaint.  Will continue to monitor for progression.  Pt was in ED due to fluid increase and running out of his medications. He was given Lasix  and was discharged.  HE is exercising today, his BP was WNL. Monitoring pt and he stated that he feels good.        [1]  Social History Tobacco Use  Smoking Status Never  Smokeless Tobacco Never

## 2024-09-27 NOTE — ED Provider Notes (Signed)
 " [place] EMERGENCY DEPARTMENT AT [facility] Provider Note   CSN: [id number] Arrival date & time: 09/26/24  2351     Patient presents with: Joint Swelling   Johnathan Richmond is a 64 y.o. male.   The history is provided by the patient.  Patient with history of aortic stenosis due to bicuspid aortic valve, with aortic valve replacement back in October, currently on warfarin, history of hypertension, history of diabetes,-history of obesity, history of paroxysmal atrial fibrillation presents with lower extremity edema.  Patient reports he ate at the [employer] and tried to avoid any fatty foods, but soon after noticed swelling in his legs. No other acute complaints.  No fevers or vomiting.  No chest pain or shortness of breath.  No orthopnea, no dyspnea on exertion. He was seen recently for similar episode, and was placed on Lasix  , but he ran out of this medication    Past Medical History:  Diagnosis Date   Aortic stenosis    Diabetes mellitus (HCC)    Dyspnea    Essential hypertension    Heart murmur    heart murmur since childhood   Hypothyroidism    Morbid obesity (HCC)    Paroxysmal A-fib (HCC)    Paroxysmal atrial flutter (HCC)     Prior to Admission medications  Medication Sig Start Date End Date Taking? Authorizing Provider  furosemide  (LASIX ) 20 MG tablet Take 1 tablet (20 mg total) by mouth daily. 09/27/24  Yes Midge Golas, MD  acetaminophen  (TYLENOL ) 500 MG tablet Take 1-2 tablets (500-1,000 mg total) by mouth every 6 (six) hours as needed. 07/06/24   Barrett, Erin R, PA-C  amiodarone  (PACERONE ) 200 MG tablet Take 1 tablet (200 mg total) by mouth daily. 07/24/24 08/31/24  Pink Laymon HERO, PA-C  insulin  glargine (LANTUS  SOLOSTAR) 100 UNIT/ML Solostar Pen Inject 20 Units into the skin daily. 09/19/24   Grooms, Courtney, PA-C  Insulin  Pen Needle (PEN NEEDLES) 31G X 5 MM MISC 1 each by Does not apply route daily. May substitute to any  manufacturer covered by patient's insurance. 05/02/24   Geroldine Berg, MD  metFORMIN  (GLUCOPHAGE -XR) 750 MG 24 hr tablet Take 1 tablet (750 mg total) by mouth daily with breakfast. 08/21/24   Grooms, Courtney, PA-C  metoprolol  succinate (TOPROL  XL) 25 MG 24 hr tablet Take 1 tablet (25 mg total) by mouth daily. 07/24/24   Strader, Laymon HERO, PA-C  OVER THE COUNTER MEDICATION Take 1 tablet by mouth daily. Boost TRT Gummy    [provider]  warfarin (COUMADIN ) 3 MG tablet Take 1 to 2 tablets daily as directed by coumadin  clinic 08/07/24   Mallipeddi, Diannah SQUIBB, MD    Allergies: Patient has no known allergies.    Review of Systems  Constitutional:  Negative for fever.  Respiratory:  Negative for shortness of breath.   Cardiovascular:  Positive for leg swelling. Negative for chest pain.    Updated Vital Signs BP (!) 149/86   Pulse 72   Temp 98.1 F (36.7 C) (Oral)   Resp 16   Ht 1.88 m (6' 2)   Wt 116.6 kg   SpO2 100%   BMI 33.00 kg/m   Physical Exam CONSTITUTIONAL: Well developed/well nourished, no distress HEAD: Normocephalic/atraumatic ENMT: Mucous membranes moist NECK: supple no meningeal signs No JVD CV: S1/S2 noted, no murmurs/rubs/gallops noted LUNGS: Lungs are clear to auscultation bilaterally, no apparent distress ABDOMEN: soft, nontender NEURO: Pt is awake/alert/appropriate, moves all extremitiesx4.  No facial droop.  EXTREMITIES: pulses normal/equal, full ROM 1+ symmetric pitting edema to bilateral lower extremities No calf tenderness SKIN: warm, color normal  (all labs ordered are listed, but only abnormal results are displayed) Labs Reviewed  CBG MONITORING, ED    EKG: None  Radiology: No results found.   Procedures   Medications Ordered in the ED - No data to display                                  Medical Decision Making Risk Prescription drug management.   This patient presents to the ED for concern of extremity edema, this  involves an extensive number of treatment options, and is a complaint that carries with it a high risk of complications and morbidity.  The differential diagnosis includes but is not limited to DVT, decompensated heart failure, cellulitis, necrotizing fasciitis, anasarca  Comorbidities that complicate the patient evaluation: Patients presentation is complicated by their history of aortic valve replacement  Social Determinants of Health: Patients poor health literacy  increases the complexity of managing their presentation  Additional history obtained: Records reviewed previous admission documents   Complexity of problems addressed: Patients presentation is most consistent with  acute, uncomplicated illness  Disposition: After consideration of the diagnostic results and the patients response to treatment,  I feel that the patent would benefit from discharge  .   This patient is well-appearing.  Patient reports he recently had a good response to Lasix  but has run out. He has no signs of decompensated heart failure.  No signs of cellulitis or DVT   Plan for another week's course of Lasix  He has PCP follow-up later this month and they can discuss need for long-term use.  Will also need to have electrolytes rechecked No indication for further workup at this time    Final diagnoses:  Peripheral edema    ED Discharge Orders          Ordered    furosemide  (LASIX ) 20 MG tablet  Daily        09/27/24 0206               Midge Golas, MD 09/27/24 0216  "

## 2024-09-30 ENCOUNTER — Encounter (HOSPITAL_COMMUNITY)

## 2024-10-01 ENCOUNTER — Encounter (HOSPITAL_COMMUNITY): Payer: Self-pay | Admitting: *Deleted

## 2024-10-01 DIAGNOSIS — Z953 Presence of xenogenic heart valve: Secondary | ICD-10-CM

## 2024-10-01 NOTE — Progress Notes (Signed)
 Cardiac Individual Treatment Plan  Patient Details  Name: Johnathan Richmond MRN: [id number] Date of Birth: 1960-11-19 Referring Provider:   Flowsheet Row CARDIAC REHAB PHASE II ORIENTATION from 08/22/2024 in [facility] CARDIAC REHABILITATION  Referring Provider Stacia Diannah Proffer MD    Initial Encounter Date:  Flowsheet Row CARDIAC REHAB PHASE II ORIENTATION from 08/22/2024 in [facility] CARDIAC REHABILITATION  Date 08/22/24    Visit Diagnosis: S/P heart valve replacement with bioprosthetic valve  Patient's Home Medications on Admission: Current Medications[1]  Past Medical History: Past Medical History:  Diagnosis Date   Aortic stenosis    Diabetes mellitus (HCC)    Dyspnea    Essential hypertension    Heart murmur    heart murmur since childhood   Hypothyroidism    Morbid obesity (HCC)    Paroxysmal A-fib (HCC)    Paroxysmal atrial flutter (HCC)     Tobacco Use: Tobacco Use History[2]  Labs: Review Flowsheet  More data exists      Latest Ref Rng & Units 11/17/2021 01/02/2024 04/02/2024 06/28/2024 07/01/2024  Labs for ITP Cardiac and Pulmonary Rehab  Cholestrol 100 - 199 mg/dL - 809  - - -  LDL (calc) 0 - 99 mg/dL - 873  - - -  HDL-C >60 mg/dL - 46  - - -  Trlycerides 0 - 149 mg/dL - 99  - - -  Hemoglobin A1c 4.8 - 5.6 % - 11.8  7.4  6.5  -  PH, Arterial 7.35 - 7.45 - - - - 7.353  7.394  7.420  7.355   PCO2 arterial 32 - 48 mmHg - - - - 38.9  34.0  38.0  45.1   Bicarbonate 20.0 - 28.0 mmol/L 26.0  - - - 21.5  20.7  24.7  24.0  25.2   TCO2 22 - 32 mmol/L - - - - 23  23  22  26  24  25  27  23  24    Acid-base deficit 0.0 - 2.0 mmol/L - - - - 4.0  4.0  2.0  1.0   O2 Saturation % 61.2  - - - 99  100  100  80  100     Details       Multiple values from one day are sorted in reverse-chronological order         Capillary Blood Glucose: Lab Results  Component Value Date   GLUCAP 99 09/27/2024   GLUCAP 121 (H) 09/16/2024   GLUCAP 125 (H) 09/02/2024    GLUCAP 135 (H) 08/28/2024   GLUCAP 138 (H) 08/26/2024     Exercise Target Goals: Exercise Program Goal: Individual exercise prescription set using results from initial 6 min walk test and THRR while considering  patients activity barriers and safety.   Exercise Prescription Goal: Starting with aerobic activity 30 plus minutes a day, 3 days per week for initial exercise prescription. Provide home exercise prescription and guidelines that participant acknowledges understanding prior to discharge.  Activity Barriers & Risk Stratification:  Activity Barriers & Cardiac Risk Stratification - 08/22/24 0940       Activity Barriers & Cardiac Risk Stratification   Activity Barriers Deconditioning;Muscular Weakness;Joint Problems;Balance Concerns   rotator cuff repair   Cardiac Risk Stratification Moderate          6 Minute Walk:  6 Minute Walk     Row Name 08/22/24 0938         6 Minute Walk   Phase Initial  Distance 1080 feet     Walk Time 6 minutes     # of Rest Breaks 0     MPH 2.05     METS 2.45     RPE 9     Perceived Dyspnea  1     VO2 Peak 8.59     Symptoms Yes (comment)     Comments SOB, legs felt weak     Resting HR 68 bpm     Resting BP 124/62     Resting Oxygen Saturation  99 %     Exercise Oxygen Saturation  during 6 min walk 98 %     Max Ex. HR 91 bpm     Max Ex. BP 136/80     2 Minute Post BP 126/70        Oxygen Initial Assessment:   Oxygen Re-Evaluation:   Oxygen Discharge (Final Oxygen Re-Evaluation):   Initial Exercise Prescription:  Initial Exercise Prescription - 08/22/24 0900       Date of Initial Exercise RX and Referring Provider   Date 08/22/24    Referring Provider Mallipeddi, Diannah Proffer MD      Oxygen   Maintain Oxygen Saturation 88% or higher      Treadmill   MPH 1.8    Grade 0.5    Minutes 15    METs 2.5      REL-XR   Level 3    Speed 50    Minutes 15    METs 2.5      Prescription Details   Frequency (times  per week) 3    Duration Progress to 30 minutes of continuous aerobic without signs/symptoms of physical distress      Intensity   THRR 40-80% of Max Heartrate 103-138    Ratings of Perceived Exertion 11-13    Perceived Dyspnea 0-4      Progression   Progression Continue to progress workloads to maintain intensity without signs/symptoms of physical distress.      Resistance Training   Training Prescription Yes    Weight 4 lb    Reps 10-15          Perform Capillary Blood Glucose checks as needed.  Exercise Prescription Changes:   Exercise Prescription Changes     Row Name 08/22/24 0900 09/09/24 1500 09/16/24 1000         Response to Exercise   Blood Pressure (Admit) 124/62 118/70 --     Blood Pressure (Exercise) 136/80 132/74 --     Blood Pressure (Exit) 126/70 108/68 --     Heart Rate (Admit) 68 bpm 80 bpm --     Heart Rate (Exercise) 91 bpm 112 bpm --     Heart Rate (Exit) 72 bpm 90 bpm --     Oxygen Saturation (Admit) 99 % -- --     Oxygen Saturation (Exercise) 98 % -- --     Rating of Perceived Exertion (Exercise) 9 13 --     Perceived Dyspnea (Exercise) 1 -- --     Symptoms SOB, legs felt weak -- --     Comments walk test results -- --     Duration -- Continue with 30 min of aerobic exercise without signs/symptoms of physical distress. --     Intensity -- THRR unchanged --       Progression   Progression -- Continue to progress workloads to maintain intensity without signs/symptoms of physical distress. --       Emergency Planning/management Officer  Weight -- 4 --     Reps -- 10-15 --       Treadmill   MPH -- 1.8 --     Grade -- 1 --     Minutes -- 15 --     METs -- 2.63 --       REL-XR   Level -- 1 --     Speed -- 47 --     Minutes -- 15 --     METs -- 3 --       Home Exercise Plan   Plans to continue exercise at -- -- Home (comment)  walking, basketball     Frequency -- -- Add 2 additional days to program exercise sessions.     Initial Home Exercises Provided  -- -- 09/16/24        Exercise Comments:   Exercise Comments     Row Name 08/21/24 1358 08/26/24 0801         Exercise Comments Walks inside and outside depending on the weather, he doesn't walk outside if it is less  than 50 degrees. First full day of exercise!  Patient was oriented to gym and equipment including functions, settings, policies, and procedures.  Patient's individual exercise prescription and treatment plan were reviewed.  All starting workloads were established based on the results of the 6 minute walk test done at initial orientation visit.  The plan for exercise progression was also introduced and progression will be customized based on patient's performance and goals.         Exercise Goals and Review:   Exercise Goals     Row Name 08/21/24 1358             Exercise Goals   Increase Physical Activity Yes       Intervention Provide advice, education, support and counseling about physical activity/exercise needs.;Develop an individualized exercise prescription for aerobic and resistive training based on initial evaluation findings, risk stratification, comorbidities and participant's personal goals.       Expected Outcomes Short Term: Attend rehab on a regular basis to increase amount of physical activity.;Long Term: Add in home exercise to make exercise part of routine and to increase amount of physical activity.;Long Term: Exercising regularly at least 3-5 days a week.       Increase Strength and Stamina Yes       Intervention Provide advice, education, support and counseling about physical activity/exercise needs.;Develop an individualized exercise prescription for aerobic and resistive training based on initial evaluation findings, risk stratification, comorbidities and participant's personal goals.       Expected Outcomes Short Term: Increase workloads from initial exercise prescription for resistance, speed, and METs.;Short Term: Perform resistance training  exercises routinely during rehab and add in resistance training at home;Long Term: Improve cardiorespiratory fitness, muscular endurance and strength as measured by increased METs and functional capacity ( )       Able to understand and use rate of perceived exertion (RPE) scale Yes       Intervention Provide education and explanation on how to use RPE scale       Expected Outcomes Short Term: Able to use RPE daily in rehab to express subjective intensity level;Long Term:  Able to use RPE to guide intensity level when exercising independently       Able to understand and use Dyspnea scale Yes       Intervention Provide education and explanation on how to use Dyspnea scale  Expected Outcomes Long Term: Able to use Dyspnea scale to guide intensity level when exercising independently;Short Term: Able to use Dyspnea scale daily in rehab to express subjective sense of shortness of breath during exertion       Knowledge and understanding of Target Heart Rate Range (THRR) Yes       Intervention Provide education and explanation of THRR including how the numbers were predicted and where they are located for reference       Expected Outcomes Short Term: Able to state/look up THRR;Short Term: Able to use daily as guideline for intensity in rehab;Long Term: Able to use THRR to govern intensity when exercising independently       Able to check pulse independently Yes       Intervention Review the importance of being able to check your own pulse for safety during independent exercise;Provide education and demonstration on how to check pulse in carotid and radial arteries.       Expected Outcomes Long Term: Able to check pulse independently and accurately;Short Term: Able to explain why pulse checking is important during independent exercise       Understanding of Exercise Prescription Yes       Intervention Provide education, explanation, and written materials on patient's individual exercise prescription        Expected Outcomes Long Term: Able to explain home exercise prescription to exercise independently;Short Term: Able to explain program exercise prescription          Exercise Goals Re-Evaluation :  Exercise Goals Re-Evaluation     Row Name 08/26/24 0801 09/11/24 0831 09/16/24 1000 09/16/24 1004       Exercise Goal Re-Evaluation   Exercise Goals Review Increase Physical Activity;Increase Strength and Stamina;Able to understand and use Dyspnea scale;Able to check pulse independently;Knowledge and understanding of Target Heart Rate Range (THRR);Able to understand and use rate of perceived exertion (RPE) scale;Understanding of Exercise Prescription Increase Physical Activity;Increase Strength and Stamina;Understanding of Exercise Prescription Increase Physical Activity;Increase Strength and Stamina;Able to understand and use rate of perceived exertion (RPE) scale;Able to understand and use Dyspnea scale;Knowledge and understanding of Target Heart Rate Range (THRR);Able to check pulse independently Increase Physical Activity;Increase Strength and Stamina    Comments Reviewed RPE and dyspnea scale, THR and program prescription with pt today.  Pt voiced understanding and was given a copy of goals to take home. Ron has completed 7 sessions of CR. He is doing well with rehab and increasing levels. Will continue to monitor and progress as able. Ron is doing well in rehab. He is starting to get more active again. Over weekend, he went to gym to work on basketball and shooting around.  He even helped a little boy with improving his jump shot.  He said it felt good to be back on the court again.He also asked about playing golf. We talked about how that does not count as exercise but to start with his short game and work up to bucket of balls and then back on course.  Reviewed home exercise with pt today.  Pt plans to walk at home and go to gym to play basketball for exercise.  Reviewed THR, pulse, RPE, sign and  symptoms, pulse oximetery and when to call 911 or MD.  Also discussed weather considerations and indoor options.  Pt voiced understanding. --    Expected Outcomes Short: Use RPE daily to regulate intensity.  Long: Follow program prescription in THR. Short: continue to attend rehab   long: continue  to exercise Short: Start to get to gym more and start playing golf some Long: Continue to improve stamina --        Discharge Exercise Prescription (Final Exercise Prescription Changes):  Exercise Prescription Changes - 09/16/24 1000       Home Exercise Plan   Plans to continue exercise at Home (comment)   walking, basketball   Frequency Add 2 additional days to program exercise sessions.    Initial Home Exercises Provided 09/16/24          Nutrition:  Target Goals: Understanding of nutrition guidelines, daily intake of sodium 1500mg , cholesterol 200mg , calories 30% from fat and 7% or less from saturated fats, daily to have 5 or more servings of fruits and vegetables.  Biometrics:  Pre Biometrics - 08/22/24 0942       Pre Biometrics   Height 6' 2 (1.88 m)    Weight 263 lb 8 oz (119.5 kg)    Waist Circumference 45 inches    Hip Circumference 44.5 inches    Waist to Hip Ratio 1.01 %    BMI (Calculated) 33.82    Grip Strength 24.3 kg    Single Leg Stand 15.2 seconds           Nutrition Therapy Plan and Nutrition Goals:  Nutrition Therapy & Goals - 08/21/24 1403       Intervention Plan   Intervention Prescribe, educate and counsel regarding individualized specific dietary modifications aiming towards targeted core components such as weight, hypertension, lipid management, diabetes, heart failure and other comorbidities.;Nutrition handout(s) given to patient.    Expected Outcomes Short Term Goal: A plan has been developed with personal nutrition goals set during dietitian appointment.;Long Term Goal: Adherence to prescribed nutrition plan.;Short Term Goal: Understand basic  principles of dietary content, such as calories, fat, sodium, cholesterol and nutrients.          Nutrition Assessments:  MEDIFICTS Score Key: >=70 Need to make dietary changes  40-70 Heart Healthy Diet <= 40 Therapeutic Level Cholesterol Diet  Flowsheet Row CARDIAC REHAB PHASE II ORIENTATION from 08/22/2024 in [facility] CARDIAC RE[facility]ILITATION  Picture Your Plate Total Score on Admission 64   Picture Your Plate Scores: <59 Unhealthy dietary pattern with much room for improvement. 41-50 Dietary pattern unlikely to meet recommendations for good health and room for improvement. 51-60 More healthful dietary pattern, with some room for improvement.  >60 Healthy dietary pattern, although there may be some specific behaviors that could be improved.    Nutrition Goals Re-Evaluation:  Nutrition Goals Re-Evaluation     Row Name 09/16/24 1006             Goals   Nutrition Goal Healthy eating and less salt intake.       Comment Genesis is trying new things and trying to eat healthier. He is realizing that salt is in everything. He has tried new foods like almonds and greek yogurt. Talked with patient about reading food labels and educated on complex carbs.       Expected Outcome Short: Continue to attend rehab. Long: Start watching food labels.          Nutrition Goals Discharge (Final Nutrition Goals Re-Evaluation):  Nutrition Goals Re-Evaluation - 09/16/24 1006       Goals   Nutrition Goal Healthy eating and less salt intake.    Comment Ermine is trying new things and trying to eat healthier. He is realizing that salt is in everything. He has tried new foods like  almonds and greek yogurt. Talked with patient about reading food labels and educated on complex carbs.    Expected Outcome Short: Continue to attend rehab. Long: Start watching food labels.          Psychosocial: Target Goals: Acknowledge presence or absence of significant depression and/or stress, maximize coping  skills, provide positive support system. Participant is able to verbalize types and ability to use techniques and skills needed for reducing stress and depression.  Initial Review & Psychosocial Screening:  Initial Psych Review & Screening - 08/21/24 1403       Initial Review   Current issues with None Identified      Family Dynamics   Good Support System? Yes    Comments Lives with two brothers who are his support.      Barriers   Psychosocial barriers to participate in program There are no identifiable barriers or psychosocial needs.      Screening Interventions   Interventions Encouraged to exercise    Expected Outcomes Short Term goal: Utilizing psychosocial counselor, staff and physician to assist with identification of specific Stressors or current issues interfering with healing process. Setting desired goal for each stressor or current issue identified.;Long Term Goal: Stressors or current issues are controlled or eliminated.;Long Term goal: The participant improves quality of Life and PHQ9 Scores as seen by post scores and/or verbalization of changes;Short Term goal: Identification and review with participant of any Quality of Life or Depression concerns found by scoring the questionnaire.          Quality of Life Scores:  Scores of 19 and below usually indicate a poorer quality of life in these areas.  A difference of  2-3 points is a clinically meaningful difference.  A difference of 2-3 points in the total score of the Quality of Life Index has been associated with significant improvement in overall quality of life, self-image, physical symptoms, and general health in studies assessing change in quality of life.  PHQ-9: Review Flowsheet       08/22/2024 04/02/2024 01/02/2024  Depression screen PHQ 2/9  Decreased Interest 0 0 2  Down, Depressed, Hopeless 1 0 0  PHQ - 2 Score 1 0 2  Altered sleeping 2 - 3  Tired, decreased energy 0 - 3  Change in appetite 0 - 3   Feeling bad or failure about yourself  0 - 0  Trouble concentrating 1 - 1  Moving slowly or fidgety/restless 1 - 0  Suicidal thoughts 0 - 0  PHQ-9 Score 5 - 12   Difficult doing work/chores Somewhat difficult - Somewhat difficult    Details       Data saved with a previous flowsheet row definition        Interpretation of Total Score  Total Score Depression Severity:  1-4 = Minimal depression, 5-9 = Mild depression, 10-14 = Moderate depression, 15-19 = Moderately severe depression, 20-27 = Severe depression   Psychosocial Evaluation and Intervention:  Psychosocial Evaluation - 08/21/24 1404       Psychosocial Evaluation & Interventions   Interventions Encouraged to exercise with the program and follow exercise prescription    Comments Jerian is coming into rehab for S/P aortic valve replacement. 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 He is hopeful to go back part time after a little while being recovered. He does walk some at home, inside unless it is above 50 degrees outside. He lives in an apartment with his two  brothers who are his support system. He is diabetic and takes Metformin  and Lantus  for it. His mobility is fine and doesn't report any trouble with anything besides he had asthma when he was younger.    Continue Psychosocial Services  Follow up required by staff          Psychosocial Re-Evaluation:  Psychosocial Re-Evaluation     Row Name 09/16/24 1007             Psychosocial Re-Evaluation   Current issues with Current Sleep Concerns       Comments Winson is doing well in rehab. He states he is not sleeping well lately, and he has been experiencing dry, itchy skin. With his age he states he has been thinking about death some, but he states that he keeps believing in God and praying. Educated patient that it is normal to experience those thoughts especially with age, but as long as he is not thinking about taking  his own. He endorsed he is definitely not thinking about that.       Expected Outcomes Short: Continue being strong in his faith. Long: Talk to MD about sleep issues if they continue.       Interventions Stress management education;Relaxation education;Encouraged to attend Cardiac Rehabilitation for the exercise       Continue Psychosocial Services  Follow up required by staff          Psychosocial Discharge (Final Psychosocial Re-Evaluation):  Psychosocial Re-Evaluation - 09/16/24 1007       Psychosocial Re-Evaluation   Current issues with Current Sleep Concerns    Comments Nikolos is doing well in rehab. He states he is not sleeping well lately, and he has been experiencing dry, itchy skin. With his age he states he has been thinking about death some, but he states that he keeps believing in God and praying. Educated patient that it is normal to experience those thoughts especially with age, but as long as he is not thinking about taking his own. He endorsed he is definitely not thinking about that.    Expected Outcomes Short: Continue being strong in his faith. Long: Talk to MD about sleep issues if they continue.    Interventions Stress management education;Relaxation education;Encouraged to attend Cardiac Rehabilitation for the exercise    Continue Psychosocial Services  Follow up required by staff          Vocational Rehabilitation: Provide vocational rehab assistance to qualifying candidates.   Vocational Rehab Evaluation & Intervention:  Vocational Rehab - 08/21/24 1402       Initial Vocational Rehab Evaluation & Intervention   Assessment shows need for Vocational Rehabilitation No          Education: Education Goals: Education classes will be provided on a weekly basis, covering required topics. Participant will state understanding/return demonstration of topics presented.  Learning Barriers/Preferences:  Learning Barriers/Preferences - 08/21/24 1403       Learning  Barriers/Preferences   Learning Barriers None    Learning Preferences None          Education Topics: Hypertension, Hypertension Reduction -Define heart disease and high blood pressure. Discus how high blood pressure affects the body and ways to reduce high blood pressure.   Exercise and Your Heart -Discuss why it is important to exercise, the FITT principles of exercise, normal and abnormal responses to exercise, and how to exercise safely.   Angina -Discuss definition of angina, causes of angina, treatment of angina, and how to decrease risk  of having angina.   Cardiac Medications -Review what the following cardiac medications are used for, how they affect the body, and side effects that may occur when taking the medications.  Medications include Aspirin , Beta blockers, calcium  channel blockers, ACE Inhibitors, angiotensin receptor blockers, diuretics, digoxin, and antihyperlipidemics.   Congestive Heart Failure -Discuss the definition of CHF, how to live with CHF, the signs and symptoms of CHF, and how keep track of weight and sodium intake.   Heart Disease and Intimacy -Discus the effect sexual activity has on the heart, how changes occur during intimacy as we age, and safety during sexual activity.   Smoking Cessation / COPD -Discuss different methods to quit smoking, the health benefits of quitting smoking, and the definition of COPD.   Nutrition I: Fats -Discuss the types of cholesterol, what cholesterol does to the heart, and how cholesterol levels can be controlled.   Nutrition II: Labels -Discuss the different components of food labels and how to read food label   Heart Parts/Heart Disease and PAD -Discuss the anatomy of the heart, the pathway of blood circulation through the heart, and these are affected by heart disease.   Stress I: Signs and Symptoms -Discuss the causes of stress, how stress may lead to anxiety and depression, and ways to limit  stress.   Stress II: Relaxation -Discuss different types of relaxation techniques to limit stress.   Warning Signs of Stroke / TIA -Discuss definition of a stroke, what the signs and symptoms are of a stroke, and how to identify when someone is having stroke.   Knowledge Questionnaire Score:   Core Components/Risk Factors/Patient Goals at Admission:  Personal Goals and Risk Factors at Admission - 08/22/24 0943       Core Components/Risk Factors/Patient Goals on Admission    Weight Management Yes;Obesity;Weight Loss    Intervention Weight Management: Develop a combined nutrition and exercise program designed to reach desired caloric intake, while maintaining appropriate intake of nutrient and fiber, sodium and fats, and appropriate energy expenditure required for the weight goal.;Weight Management: Provide education and appropriate resources to help participant work on and attain dietary goals.;Weight Management/Obesity: Establish reasonable short term and long term weight goals.;Obesity: Provide education and appropriate resources to help participant work on and attain dietary goals.    Admit Weight 263 lb 8 oz (119.5 kg)    Goal Weight: Short Term 258 lb (117 kg)    Goal Weight: Long Term 253 lb (114.8 kg)    Expected Outcomes Short Term: Continue to assess and modify interventions until short term weight is achieved;Long Term: Adherence to nutrition and physical activity/exercise program aimed toward attainment of established weight goal;Weight Loss: Understanding of general recommendations for a balanced deficit meal plan, which promotes 1-2 lb weight loss per week and includes a negative energy balance of [phone] kcal/d;Understanding recommendations for meals to include 15-35% energy as protein, 25-35% energy from fat, 35-60% energy from carbohydrates, less than 200mg  of dietary cholesterol, 20-35 gm of total fiber daily;Understanding of distribution of calorie intake throughout the day  with the consumption of 4-5 meals/snacks    Diabetes Yes    Intervention Provide education about signs/symptoms and action to take for hypo/hyperglycemia.;Provide education about proper nutrition, including hydration, and aerobic/resistive exercise prescription along with prescribed medications to achieve blood glucose in normal ranges: Fasting glucose 65-99 mg/dL    Expected Outcomes Short Term: Participant verbalizes understanding of the signs/symptoms and immediate care of hyper/hypoglycemia, proper foot care and importance of medication, aerobic/resistive  exercise and nutrition plan for blood glucose control.;Long Term: Attainment of HbA1C < 7%.    Hypertension Yes    Intervention Monitor prescription use compliance.;Provide education on lifestyle modifcations including regular physical activity/exercise, weight management, moderate sodium restriction and increased consumption of fresh fruit, vegetables, and low fat dairy, alcohol moderation, and smoking cessation.    Expected Outcomes Long Term: Maintenance of blood pressure at goal levels.;Short Term: Continued assessment and intervention until BP is < 140/68mm HG in hypertensive participants. < 130/26mm HG in hypertensive participants with diabetes, heart failure or chronic kidney disease.          Core Components/Risk Factors/Patient Goals Review:   Goals and Risk Factor Review     Row Name 09/16/24 1004             Core Components/Risk Factors/Patient Goals Review   Personal Goals Review Weight Management/Obesity;Hypertension;Diabetes       Review Tyreon is doing well in rehab! He states his blood pressure and blood sugar is doing good. His weight is down some which he is happy about.       Expected Outcomes Short: Continue to attend rehab. Long: Continue checking blood sugar and pressure at home and report any abnormalities.          Core Components/Risk Factors/Patient Goals at Discharge (Final Review):   Goals and Risk Factor  Review - 09/16/24 1004       Core Components/Risk Factors/Patient Goals Review   Personal Goals Review Weight Management/Obesity;Hypertension;Diabetes    Review Janai is doing well in rehab! He states his blood pressure and blood sugar is doing good. His weight is down some which he is happy about.    Expected Outcomes Short: Continue to attend rehab. Long: Continue checking blood sugar and pressure at home and report any abnormalities.          ITP Comments:  ITP Comments     Row Name 08/21/24 1409 08/22/24 0937 08/26/24 0801 09/03/24 1219 10/01/24 0955   ITP Comments Completed virtual orientation today.  EP evaluation is scheduled for 08/22/24 at 0830 .  Documentation for diagnosis can be found in [facility] encounter 07/01/24. Patient attend orientation today.  Patient is attending Cardiac Rehabilitation Program.  Documentation for diagnosis can be found in [facility] 07/01/24.  Reviewed medical chart, RPE/RPD, gym safety, and program guidelines.  Patient was fitted to equipment they will be using during rehab.  Patient is scheduled to start exercise on Monday 08/26/24 at 915.   Initial ITP created and sent for review and signature by Dr. Dorn Ross, Medical Director for Cardiac Rehabilitation Program. First full day of exercise!  Patient was oriented to gym and equipment including functions, settings, policies, and procedures.  Patient's individual exercise prescription and treatment plan were reviewed.  All starting workloads were established based on the results of the 6 minute walk test done at initial orientation visit.  The plan for exercise progression was also introduced and progression will be customized based on patient's performance and goals. 30 day review completed. ITP sent to Dr. Dorn Ross, Medical Director of Cardiac Rehab. Continue with ITP unless changes are made by physician.    New to program 30 day review completed. ITP sent to Dr. Dorn Ross, Medical Director of Cardiac  Rehab. Continue with ITP unless changes are made by physician.      Comments: 30 day review     [1]  Current Outpatient Medications:    acetaminophen  (TYLENOL ) 500 MG tablet, Take 1-2 tablets (  500-1,000 mg total) by mouth every 6 (six) hours as needed., Disp: 30 tablet, Rfl: 0   amiodarone  (PACERONE ) 200 MG tablet, Take 1 tablet (200 mg total) by mouth daily., Disp: 38 tablet, Rfl: 0   furosemide  (LASIX ) 20 MG tablet, Take 1 tablet (20 mg total) by mouth daily., Disp: 7 tablet, Rfl: 0   insulin  glargine (LANTUS  SOLOSTAR) 100 UNIT/ML Solostar Pen, Inject 20 Units into the skin daily., Disp: 15 mL, Rfl: 0   Insulin  Pen Needle (PEN NEEDLES) 31G X 5 MM MISC, 1 each by Does not apply route daily. May substitute to any manufacturer covered by patient's insurance., Disp: 100 each, Rfl: 0   metFORMIN  (GLUCOPHAGE -XR) 750 MG 24 hr tablet, Take 1 tablet (750 mg total) by mouth daily with breakfast., Disp: 90 tablet, Rfl: 0   metoprolol  succinate (TOPROL  XL) 25 MG 24 hr tablet, Take 1 tablet (25 mg total) by mouth daily., Disp: 90 tablet, Rfl: 3   OVER THE COUNTER MEDICATION, Take 1 tablet by mouth daily. Boost TRT Gummy, Disp: , Rfl:    warfarin (COUMADIN ) 3 MG tablet, Take 1 to 2 tablets daily as directed by coumadin  clinic, Disp: 60 tablet, Rfl: 3 [2]  Social History Tobacco Use  Smoking Status Never  Smokeless Tobacco Never

## 2024-10-02 ENCOUNTER — Encounter (HOSPITAL_COMMUNITY)

## 2024-10-03 ENCOUNTER — Other Ambulatory Visit: Payer: Self-pay

## 2024-10-03 ENCOUNTER — Ambulatory Visit (INDEPENDENT_AMBULATORY_CARE_PROVIDER_SITE_OTHER): Payer: Self-pay | Admitting: Physician Assistant

## 2024-10-03 ENCOUNTER — Encounter: Payer: Self-pay | Admitting: Physician Assistant

## 2024-10-03 VITALS — BP 128/86 | HR 72 | Temp 97.2°F | Ht 74.0 in | Wt 275.2 lb

## 2024-10-03 DIAGNOSIS — Z1211 Encounter for screening for malignant neoplasm of colon: Secondary | ICD-10-CM | POA: Diagnosis not present

## 2024-10-03 DIAGNOSIS — Z953 Presence of xenogenic heart valve: Secondary | ICD-10-CM

## 2024-10-03 DIAGNOSIS — Z794 Long term (current) use of insulin: Secondary | ICD-10-CM

## 2024-10-03 DIAGNOSIS — I10 Essential (primary) hypertension: Secondary | ICD-10-CM | POA: Diagnosis not present

## 2024-10-03 DIAGNOSIS — G4700 Insomnia, unspecified: Secondary | ICD-10-CM | POA: Diagnosis not present

## 2024-10-03 DIAGNOSIS — E1165 Type 2 diabetes mellitus with hyperglycemia: Secondary | ICD-10-CM | POA: Diagnosis not present

## 2024-10-03 MED ORDER — LANTUS SOLOSTAR 100 UNIT/ML ~~LOC~~ SOPN: 20.0000 [IU] | PEN_INJECTOR | Freq: Every day | SUBCUTANEOUS | 1 refills | Status: AC

## 2024-10-03 MED ORDER — METFORMIN HCL ER 750 MG PO TB24: 750.0000 mg | ORAL_TABLET | Freq: Every day | ORAL | 1 refills | Status: AC

## 2024-10-03 NOTE — Progress Notes (Signed)
 "  Established Patient Office Visit  Subjective   Patient ID: Johnathan Richmond, male    DOB: 23-Jul-1961  Age: 64 y.o. MRN: [id number]  Chief Complaint  Patient presents with   Foot Swelling    Pt went to hospital for food swelling - asking to get fluid pills long term    Discussed the use of AI scribe software for clinical note transcription with the patient, who gave verbal consent to proceed.  History of Present Illness Johnathan Richmond is a 64 year old male with diabetes who presents for diabetes follow up  with complaints of sleep disturbances   He has been experiencing sleep disturbances for the past few months, which he attributes to weight gain due to waking up and eating while watching TV. He has difficulty sleeping on his back following open heart surgery, as he previously slept on his side. He purchased a Lazy Boy chair to aid in comfort but still struggles with sleep. He endorses his sleep schedule is not consistent due to being recently retired and having decreased responsibilities.   He manages his diabetes with insulin  injections and metformin , taking 20 units of insulin . He has two pens of insulin , with a refill scheduled for the 17th of the next month, and takes metformin  regularly.  He is confused about dietary choices due to his diabetes and heart condition, noting that many foods contain salt or sugar. He is trying to reduce soda intake and is concerned about dietary restrictions related to his warfarin use, which limits his consumption of foods like kale.  He has a recent history of open heart surgery aortic valve replacement and is currently on Coumadin , which he takes with his other medicines, with an increased dose on Wednesdays and Sundays. He previously used Eliquis  but switched due to cost. He is interested in returning to Eliquis  now that he has Medicare and [employer] coverage.  He has a twin brother and mentions a strong family history of twins, with  twelve sets on his father's side. He retired in November, which has impacted his daily routine and physical activity. He enjoys golfing and is looking forward to returning to the golf range as the weather improves.    Review of Systems  Constitutional:  Positive for activity change. Negative for appetite change, fatigue and fever.  Eyes:  Negative for visual disturbance.  Respiratory:  Negative for cough and shortness of breath.   Cardiovascular:  Negative for chest pain.  Neurological:  Negative for light-headedness and headaches.  Psychiatric/Behavioral:  Positive for sleep disturbance. Negative for agitation and decreased concentration. The patient is not nervous/anxious.        Objective:     BP 128/86   Pulse 72   Temp (!) 97.2 F (36.2 C)   Ht 6' 2 (1.88 m)   Wt 275 lb 3.2 oz (124.8 kg)   SpO2 99%   BMI 35.33 kg/m    Physical Exam Constitutional:      General: He is not in acute distress.    Appearance: Normal appearance. He is obese. He is not ill-appearing.  HENT:     Head: Normocephalic and atraumatic.     Mouth/Throat:     Mouth: Mucous membranes are moist.     Pharynx: Oropharynx is clear.  Eyes:     Extraocular Movements: Extraocular movements intact.     Conjunctiva/sclera: Conjunctivae normal.  Cardiovascular:     Rate and Rhythm: Normal rate and regular rhythm.  Heart sounds: Normal heart sounds. No murmur heard. Pulmonary:     Effort: Pulmonary effort is normal.     Breath sounds: Normal breath sounds. No wheezing, rhonchi or rales.  Musculoskeletal:     Right lower leg: No edema.     Left lower leg: No edema.  Skin:    General: Skin is warm and dry.     Comments: Surgical incisions are well healed and now appear as scars  Neurological:     General: No focal deficit present.     Mental Status: He is alert and oriented to person, place, and time.  Psychiatric:        Mood and Affect: Mood normal.        Behavior: Behavior normal.       No results found for any visits on 10/03/24.  The 10-year ASCVD risk score (Arnett DK, et al., 2019) is: 28.7%    Assessment & Plan:   Return in about 6 months (around 04/02/2025) for DM.   Type 2 diabetes mellitus with hyperglycemia, with long-term current use of insulin  [facility]) Assessment & Plan: Reports numbness in feet for a few months, likely related to diabetic neuropathy, checking A1c today to assess control, however last A1c was very good, indicating good glycemic control. Confusion about dietary choices due to conflicting needs for low salt and low sugar diets.  - Provided printed materials on diet suggestions for diabetes and heart health. - Referred to diabetic nutritionist for dietary guidance. - Refilled insulin  prescription. - Continue metformin  and insulin  as prescribed. - Ordered lab work to check cholesterol, blood sugar, and kidney function.  Orders: -     Lantus  SoloStar; Inject 20 Units into the skin daily.  Dispense: 15 mL; Refill: 1 -     metFORMIN  HCl ER; Take 1 tablet (750 mg total) by mouth daily with breakfast.  Dispense: 90 tablet; Refill: 1 -     Lipid panel -     Hemoglobin A1c -     Microalbumin / creatinine urine ratio -     Amb Referral to Nutrition and Diabetic Education  Essential hypertension Assessment & Plan: 128/86 Controlled. Continue current medications. No change in management. Discussed DASH diet and dietary sodium restrictions.  Increase dietary efforts and physical activity.   Orders: -     Microalbumin / creatinine urine ratio  Insomnia, unspecified type Assessment & Plan: Reports difficulty sleeping, possibly due to recent heart surgery and changes in sleeping position. Uses a Lazy Boy for comfort but ends up watching TV at night. - Encouraged consistent sleep schedule with regular bed and wake times. - Advised against watching TV at night to improve sleep quality.   S/P aortic valve replacement with bioprosthetic  valve Assessment & Plan: Currently on warfarin for anticoagulation. Interested in switching back to Eliquis  due to ease of use and new insurance coverage. Recent INR was 2.3, within the target range of 2-3. Has scheduled cardiac follow up next month.  - Discuss with cardiologist the possibility of switching to Eliquis  if covered by insurance. - Continue current anticoagulation regimen until further evaluation.   Screen for colon cancer -     Ambulatory referral to Gastroenterology   Charmaine Jarmon Javid, PA-C "

## 2024-10-03 NOTE — Assessment & Plan Note (Signed)
 128/86 Controlled. Continue current medications. No change in management. Discussed DASH diet and dietary sodium restrictions.  Increase dietary efforts and physical activity.

## 2024-10-03 NOTE — Assessment & Plan Note (Signed)
 Reports numbness in feet for a few months, likely related to diabetic neuropathy, checking A1c today to assess control, however last A1c was very good, indicating good glycemic control. Confusion about dietary choices due to conflicting needs for low salt and low sugar diets.  - Provided printed materials on diet suggestions for diabetes and heart health. - Referred to diabetic nutritionist for dietary guidance. - Refilled insulin  prescription. - Continue metformin  and insulin  as prescribed. - Ordered lab work to check cholesterol, blood sugar, and kidney function.

## 2024-10-03 NOTE — Assessment & Plan Note (Signed)
 Reports difficulty sleeping, possibly due to recent heart surgery and changes in sleeping position. Uses a Lazy Boy for comfort but ends up watching TV at night. - Encouraged consistent sleep schedule with regular bed and wake times. - Advised against watching TV at night to improve sleep quality.

## 2024-10-03 NOTE — Assessment & Plan Note (Signed)
 Currently on warfarin for anticoagulation. Interested in switching back to Eliquis  due to ease of use and new insurance coverage. Recent INR was 2.3, within the target range of 2-3. Has scheduled cardiac follow up next month.  - Discuss with cardiologist the possibility of switching to Eliquis  if covered by insurance. - Continue current anticoagulation regimen until further evaluation.

## 2024-10-04 ENCOUNTER — Encounter (HOSPITAL_COMMUNITY)

## 2024-10-04 ENCOUNTER — Ambulatory Visit: Payer: Self-pay | Admitting: Physician Assistant

## 2024-10-04 LAB — MICROALBUMIN / CREATININE URINE RATIO
Creatinine, Urine: 225.5 mg/dL
Microalb/Creat Ratio: 3 mg/g{creat} (ref 0–29)
Microalbumin, Urine: 6.7 ug/mL

## 2024-10-04 LAB — LIPID PANEL
Chol/HDL Ratio: 4.4 ratio (ref 0.0–5.0)
Cholesterol, Total: 207 mg/dL — ABNORMAL HIGH (ref 100–199)
HDL: 47 mg/dL
LDL Chol Calc (NIH): 138 mg/dL — ABNORMAL HIGH (ref 0–99)
Triglycerides: 121 mg/dL (ref 0–149)
VLDL Cholesterol Cal: 22 mg/dL (ref 5–40)

## 2024-10-04 LAB — HEMOGLOBIN A1C
Est. average glucose Bld gHb Est-mCnc: 137 mg/dL
Hgb A1c MFr Bld: 6.4 % — ABNORMAL HIGH (ref 4.8–5.6)

## 2024-10-04 MED ORDER — ROSUVASTATIN CALCIUM 10 MG PO TABS: 10.0000 mg | ORAL_TABLET | Freq: Every day | ORAL | 3 refills | Status: AC

## 2024-10-07 ENCOUNTER — Encounter (HOSPITAL_COMMUNITY)

## 2024-10-09 ENCOUNTER — Encounter (HOSPITAL_COMMUNITY)

## 2024-10-09 ENCOUNTER — Telehealth (HOSPITAL_COMMUNITY): Payer: Self-pay | Admitting: Surgery

## 2024-10-09 NOTE — Telephone Encounter (Signed)
 Pt called our office to say that he overslept this morning, but that he will be back Friday for cardiac rehab.

## 2024-10-10 ENCOUNTER — Encounter: Payer: Self-pay | Admitting: Gastroenterology

## 2024-10-11 ENCOUNTER — Encounter (HOSPITAL_COMMUNITY): Admission: RE | Admit: 2024-10-11

## 2024-10-11 DIAGNOSIS — Z953 Presence of xenogenic heart valve: Secondary | ICD-10-CM

## 2024-10-11 NOTE — Progress Notes (Signed)
 Daily Session Note  Patient Details  Name: Johnathan Richmond MRN: [id number] Date of Birth: [birth year] Referring Provider:   Flowsheet Row CARDIAC REHAB PHASE II ORIENTATION from 08/22/2024 in [facility] C[facility]AC [facility]  Referring Provider Stacia Diannah Proffer MD    Encounter Date: 10/11/2024  Check In:  Session Check In - 10/11/24 9071       Check-In   Supervising physician immediately available to respond to emergencies See telemetry face sheet for immediately available MD    Location [facility]    Staff Present Laymon Rattler, BSN, RN, WTA-C;Heather Con, BS, Exercise Physiologist;Victoria Zina, RN    Virtual Visit No    Medication changes reported     No    Fall or balance concerns reported    No    Tobacco Cessation No Change    Warm-up and Cool-down Performed on first and last piece of equipment    Resistance Training Performed Yes    VAD Patient? No    PAD/SET Patient? No      Pain Assessment   Currently in Pain? No/denies          Capillary Blood Glucose: No results found for this or any previous visit (from the past 24 hours).    Tobacco Use History[1]  Goals Met:  Independence with exercise equipment Exercise tolerated well No report of concerns or symptoms today Strength training completed today  Goals Unmet:  Not Applicable  Comments: Pt able to follow exercise prescription today without complaint.  Will continue to monitor for progression.        [1]  Social History Tobacco Use  Smoking Status Never  Smokeless Tobacco Never

## 2024-10-11 NOTE — Progress Notes (Signed)
 Reviewed home exercise with pt today.  Pt plans to walk at home and start going to senior center for [facility].  He wants to use their equipment and is looking forward to playing golf again too.  Reviewed THR, pulse, RPE, sign and symptoms, pulse oximetery and when to call 911 or MD.  Also discussed weather considerations and indoor options.  Pt voiced understanding.

## 2024-10-14 ENCOUNTER — Encounter (HOSPITAL_COMMUNITY)

## 2024-10-16 ENCOUNTER — Encounter (HOSPITAL_COMMUNITY)

## 2024-10-18 ENCOUNTER — Encounter (HOSPITAL_COMMUNITY)

## 2024-10-21 ENCOUNTER — Encounter (HOSPITAL_COMMUNITY)

## 2024-10-23 ENCOUNTER — Ambulatory Visit

## 2024-10-23 ENCOUNTER — Encounter (HOSPITAL_COMMUNITY)

## 2024-10-24 ENCOUNTER — Ambulatory Visit: Admitting: Student

## 2024-10-25 ENCOUNTER — Encounter (HOSPITAL_COMMUNITY)

## 2024-10-28 ENCOUNTER — Encounter (HOSPITAL_COMMUNITY)

## 2024-10-30 ENCOUNTER — Encounter (HOSPITAL_COMMUNITY)

## 2024-11-01 ENCOUNTER — Encounter (HOSPITAL_COMMUNITY)

## 2024-11-04 ENCOUNTER — Encounter (HOSPITAL_COMMUNITY)

## 2024-11-06 ENCOUNTER — Encounter (HOSPITAL_COMMUNITY)

## 2024-11-08 ENCOUNTER — Encounter (HOSPITAL_COMMUNITY)

## 2024-11-11 ENCOUNTER — Encounter (HOSPITAL_COMMUNITY)

## 2024-11-13 ENCOUNTER — Encounter (HOSPITAL_COMMUNITY)

## 2024-11-15 ENCOUNTER — Encounter (HOSPITAL_COMMUNITY)

## 2024-11-18 ENCOUNTER — Encounter (HOSPITAL_COMMUNITY)

## 2024-11-19 ENCOUNTER — Ambulatory Visit: Admitting: Gastroenterology

## 2024-11-20 ENCOUNTER — Encounter (HOSPITAL_COMMUNITY)

## 2025-04-02 ENCOUNTER — Ambulatory Visit: Admitting: Physician Assistant
